# Patient Record
Sex: Female | Born: 1940
Health system: Southern US, Community
[De-identification: ages and names within clinical notes are randomized; demographics above are authoritative.]

## PROBLEM LIST (undated history)

## (undated) DIAGNOSIS — M810 Age-related osteoporosis without current pathological fracture: Secondary | ICD-10-CM

## (undated) DIAGNOSIS — I10 Essential (primary) hypertension: Secondary | ICD-10-CM

## (undated) DIAGNOSIS — E538 Deficiency of other specified B group vitamins: Secondary | ICD-10-CM

## (undated) DIAGNOSIS — K529 Noninfective gastroenteritis and colitis, unspecified: Secondary | ICD-10-CM

## (undated) DIAGNOSIS — K2990 Gastroduodenitis, unspecified, without bleeding: Secondary | ICD-10-CM

## (undated) DIAGNOSIS — L259 Unspecified contact dermatitis, unspecified cause: Secondary | ICD-10-CM

## (undated) DIAGNOSIS — K2970 Gastritis, unspecified, without bleeding: Secondary | ICD-10-CM

## (undated) DIAGNOSIS — E559 Vitamin D deficiency, unspecified: Secondary | ICD-10-CM

## (undated) DIAGNOSIS — E785 Hyperlipidemia, unspecified: Secondary | ICD-10-CM

## (undated) DIAGNOSIS — F3289 Other specified depressive episodes: Secondary | ICD-10-CM

## (undated) DIAGNOSIS — F329 Major depressive disorder, single episode, unspecified: Secondary | ICD-10-CM

## (undated) HISTORY — DX: Gastritis, unspecified, without bleeding: K29.70

## (undated) HISTORY — DX: Other specified depressive episodes: F32.89

## (undated) HISTORY — DX: Hyperlipidemia, unspecified: E78.5

## (undated) HISTORY — DX: Major depressive disorder, single episode, unspecified: F32.9

## (undated) HISTORY — PX: COLONOSCOPY: SHX174

## (undated) HISTORY — DX: Vitamin D deficiency, unspecified: E55.9

## (undated) HISTORY — DX: Deficiency of other specified B group vitamins: E53.8

## (undated) HISTORY — DX: Noninfective gastroenteritis and colitis, unspecified: K52.9

## (undated) HISTORY — DX: Gastroduodenitis, unspecified, without bleeding: K29.90

## (undated) HISTORY — DX: Essential (primary) hypertension: I10

## (undated) HISTORY — DX: Unspecified contact dermatitis, unspecified cause: L25.9

## (undated) HISTORY — DX: Age-related osteoporosis without current pathological fracture: M81.0

---

## 2002-03-01 ENCOUNTER — Other Ambulatory Visit: Admission: RE | Admit: 2002-03-01 | Discharge: 2002-03-01 | Payer: Self-pay | Admitting: Gynecology

## 2003-05-23 ENCOUNTER — Other Ambulatory Visit: Admission: RE | Admit: 2003-05-23 | Discharge: 2003-05-23 | Payer: Self-pay | Admitting: Gynecology

## 2011-02-08 ENCOUNTER — Encounter (INDEPENDENT_AMBULATORY_CARE_PROVIDER_SITE_OTHER): Payer: Self-pay | Admitting: *Deleted

## 2011-02-08 ENCOUNTER — Encounter (INDEPENDENT_AMBULATORY_CARE_PROVIDER_SITE_OTHER): Payer: Self-pay

## 2011-02-21 ENCOUNTER — Telehealth (INDEPENDENT_AMBULATORY_CARE_PROVIDER_SITE_OTHER): Payer: Self-pay | Admitting: *Deleted

## 2011-02-21 ENCOUNTER — Ambulatory Visit (INDEPENDENT_AMBULATORY_CARE_PROVIDER_SITE_OTHER): Payer: Medicare Other | Admitting: Internal Medicine

## 2011-02-21 ENCOUNTER — Encounter (INDEPENDENT_AMBULATORY_CARE_PROVIDER_SITE_OTHER): Payer: Self-pay | Admitting: Internal Medicine

## 2011-02-21 VITALS — BP 128/80 | HR 72 | Temp 98.9°F | Resp 16 | Ht 63.0 in | Wt 121.0 lb

## 2011-02-21 DIAGNOSIS — R195 Other fecal abnormalities: Secondary | ICD-10-CM

## 2011-02-21 NOTE — Telephone Encounter (Signed)
TCS sch'd 05/08/11 @ 9:30 (8:30), asa 2 days, movi prep instructions given

## 2011-02-21 NOTE — Progress Notes (Signed)
Subjective:     Patient ID: Susan Bennett, female   DOB: 1940/09/07, 70 y.o.   MRN: [id number]  HPI   Susan Bennett is 70 yr old female presenting today with c/o diarrhea.  The diarrhea started in March of this year. Her stools are thin and shaped like a  " U".  She also has been incontinent. She has to rush to go to the bathroom.  Sometimes she feels like she has to but she can't.  She usually has a BM about every other day.  In the past week however,  she says she has been better. Her stools have been normal for the past week.  When she has the sudden urge, it is loose. No recent antibiotics  Her appetite is good. No weight loss. No bright rectal bleeding or melana.   She was hospital last year for a syncopal episode.  EGD 02/22/10  Normal EGD.  No fever.  Her last colonoscopy was in Sept of 2003 by Dr. Cleotis Nipper and was normal per patient   Review of Systemssee hpi Current Outpatient Prescriptions  Medication Sig Dispense Refill  . ALPRAZolam (XANAX) 0.25 MG tablet Take 0.25 mg by mouth at bedtime as needed.        . benazepril (LOTENSIN) 20 MG tablet Take 20 mg by mouth daily.        . hydrOXYzine (ATARAX) 10 MG tablet Take 10 mg by mouth. As Needed      . HYOSCYAMINE PO Take 0.125 mg by mouth 1 day or 1 dose.        . promethazine (PHENERGAN) 25 MG tablet Take 25 mg by mouth. As needed       . sertraline (ZOLOFT) 50 MG tablet Take 50 mg by mouth daily.        . Vitamin D, Ergocalciferol, (DRISDOL) 50000 UNITS CAPS Take 50,000 Units by mouth. Patient takes by mouth once a week        History   Social History  . Marital Status: Married    Spouse Name: N/A    Number of Children: N/A  . Years of Education: N/A   Occupational History  . Not on file.   Social History Main Topics  . Smoking status: Former Smoker    Types: Cigarettes    Quit date: 02/20/2001  . Smokeless tobacco: Never Used  . Alcohol Use: No  . Drug Use: No  . Sexually Active: Not on file   Other Topics Concern  . Not on  file   Social History Narrative  . No narrative on file   Family History  Problem Relation Age of Onset  . Healthy Sister   . Healthy Brother   . Throat cancer Brother   . Stroke Brother   . Heart disease Brother    No Known Allergies     Objective:   Physical Exam Filed Vitals:   02/21/11 1609  Height: 5\' 3"  (1.6 m)  Weight: 121 lb (54.885 kg)   Filed Vitals:   02/21/11 1609  Height: 5\' 3"  (1.6 m)  Weight: 121 lb (54.885 kg)      Alert and oriented. Skin warm and dry. Oral mucosa is moist. Natural teeth in good condition. Sclera anicteric, conjunctivae is pink. Thyroid not enlarged. No cervical lymphadenopathy. Lungs clear. Heart regular rate and rhythm.  Abdomen is soft. Bowel sounds are positive. No hepatomegaly. No abdominal masses felt. No tenderness.  No edema to lower extremities. Patient is alert and oriented.  Assessment:     Change in bowel.   Colonic neoplasm needs to be ruled out.    Plan:      Will schedule a colonoscopy in the near future.The risks and benefits such as perforation, bleeding, and infection were reviewed with the patient and is agreeable.

## 2011-02-25 MED ORDER — PEG-KCL-NACL-NASULF-NA ASC-C 100 G PO SOLR: 1.0000 | Freq: Once | ORAL | Status: DC

## 2011-04-09 ENCOUNTER — Other Ambulatory Visit (INDEPENDENT_AMBULATORY_CARE_PROVIDER_SITE_OTHER): Payer: Self-pay | Admitting: *Deleted

## 2011-04-09 DIAGNOSIS — R195 Other fecal abnormalities: Secondary | ICD-10-CM

## 2011-05-01 ENCOUNTER — Encounter (HOSPITAL_COMMUNITY): Payer: Self-pay | Admitting: Pharmacy Technician

## 2011-05-08 ENCOUNTER — Encounter (HOSPITAL_COMMUNITY): Admission: RE | Payer: Self-pay | Source: Ambulatory Visit

## 2011-05-08 ENCOUNTER — Ambulatory Visit (HOSPITAL_COMMUNITY): Admission: RE | Admit: 2011-05-08 | Payer: Medicare Other | Source: Ambulatory Visit | Admitting: Internal Medicine

## 2011-05-08 SURGERY — COLONOSCOPY
Anesthesia: Moderate Sedation

## 2011-11-06 ENCOUNTER — Encounter (INDEPENDENT_AMBULATORY_CARE_PROVIDER_SITE_OTHER): Payer: Self-pay

## 2014-08-22 DIAGNOSIS — H35033 Hypertensive retinopathy, bilateral: Secondary | ICD-10-CM | POA: Diagnosis not present

## 2014-08-22 DIAGNOSIS — Z961 Presence of intraocular lens: Secondary | ICD-10-CM | POA: Diagnosis not present

## 2014-08-22 DIAGNOSIS — H26493 Other secondary cataract, bilateral: Secondary | ICD-10-CM | POA: Diagnosis not present

## 2014-08-22 DIAGNOSIS — H524 Presbyopia: Secondary | ICD-10-CM | POA: Diagnosis not present

## 2014-09-01 DIAGNOSIS — E785 Hyperlipidemia, unspecified: Secondary | ICD-10-CM | POA: Diagnosis not present

## 2014-09-01 DIAGNOSIS — E559 Vitamin D deficiency, unspecified: Secondary | ICD-10-CM | POA: Diagnosis not present

## 2014-09-01 DIAGNOSIS — L309 Dermatitis, unspecified: Secondary | ICD-10-CM | POA: Diagnosis not present

## 2014-09-01 DIAGNOSIS — I10 Essential (primary) hypertension: Secondary | ICD-10-CM | POA: Diagnosis not present

## 2014-09-01 DIAGNOSIS — R5383 Other fatigue: Secondary | ICD-10-CM | POA: Diagnosis not present

## 2014-09-01 DIAGNOSIS — F418 Other specified anxiety disorders: Secondary | ICD-10-CM | POA: Diagnosis not present

## 2014-09-01 DIAGNOSIS — E538 Deficiency of other specified B group vitamins: Secondary | ICD-10-CM | POA: Diagnosis not present

## 2014-09-01 DIAGNOSIS — G4700 Insomnia, unspecified: Secondary | ICD-10-CM | POA: Diagnosis not present

## 2014-09-21 DIAGNOSIS — E538 Deficiency of other specified B group vitamins: Secondary | ICD-10-CM | POA: Diagnosis not present

## 2014-09-21 DIAGNOSIS — I10 Essential (primary) hypertension: Secondary | ICD-10-CM | POA: Diagnosis not present

## 2014-09-21 DIAGNOSIS — L309 Dermatitis, unspecified: Secondary | ICD-10-CM | POA: Diagnosis not present

## 2014-09-21 DIAGNOSIS — F418 Other specified anxiety disorders: Secondary | ICD-10-CM | POA: Diagnosis not present

## 2015-01-25 DIAGNOSIS — M549 Dorsalgia, unspecified: Secondary | ICD-10-CM | POA: Diagnosis not present

## 2015-01-25 DIAGNOSIS — E538 Deficiency of other specified B group vitamins: Secondary | ICD-10-CM | POA: Diagnosis not present

## 2015-01-25 DIAGNOSIS — L309 Dermatitis, unspecified: Secondary | ICD-10-CM | POA: Diagnosis not present

## 2015-01-25 DIAGNOSIS — F418 Other specified anxiety disorders: Secondary | ICD-10-CM | POA: Diagnosis not present

## 2015-01-25 DIAGNOSIS — I10 Essential (primary) hypertension: Secondary | ICD-10-CM | POA: Diagnosis not present

## 2015-06-13 DIAGNOSIS — I10 Essential (primary) hypertension: Secondary | ICD-10-CM | POA: Diagnosis not present

## 2015-06-13 DIAGNOSIS — E538 Deficiency of other specified B group vitamins: Secondary | ICD-10-CM | POA: Diagnosis not present

## 2015-08-22 DIAGNOSIS — H524 Presbyopia: Secondary | ICD-10-CM | POA: Diagnosis not present

## 2015-08-22 DIAGNOSIS — H35033 Hypertensive retinopathy, bilateral: Secondary | ICD-10-CM | POA: Diagnosis not present

## 2015-08-22 DIAGNOSIS — H5213 Myopia, bilateral: Secondary | ICD-10-CM | POA: Diagnosis not present

## 2015-08-22 DIAGNOSIS — H26493 Other secondary cataract, bilateral: Secondary | ICD-10-CM | POA: Diagnosis not present

## 2015-08-31 DIAGNOSIS — E538 Deficiency of other specified B group vitamins: Secondary | ICD-10-CM | POA: Diagnosis not present

## 2016-03-21 DIAGNOSIS — R300 Dysuria: Secondary | ICD-10-CM | POA: Diagnosis not present

## 2016-03-21 DIAGNOSIS — L309 Dermatitis, unspecified: Secondary | ICD-10-CM | POA: Diagnosis not present

## 2016-03-21 DIAGNOSIS — E538 Deficiency of other specified B group vitamins: Secondary | ICD-10-CM | POA: Diagnosis not present

## 2016-03-21 DIAGNOSIS — I10 Essential (primary) hypertension: Secondary | ICD-10-CM | POA: Diagnosis not present

## 2016-03-21 DIAGNOSIS — F418 Other specified anxiety disorders: Secondary | ICD-10-CM | POA: Diagnosis not present

## 2016-04-16 ENCOUNTER — Telehealth: Payer: Self-pay | Admitting: *Deleted

## 2016-04-16 NOTE — Telephone Encounter (Signed)
aware

## 2016-04-16 NOTE — Telephone Encounter (Signed)
Patient's daughter called stating that patient is not driving, has short term memory loss, patient is living alone and daughter is concerned with memory loss

## 2016-04-16 NOTE — Telephone Encounter (Signed)
Patient is seeing you on 11/17 for a New Patient appt.  Patient states that she will no longer be seeing Dr. Charm BargesButler

## 2016-04-16 NOTE — Telephone Encounter (Signed)
Is this patient still seeing Dr. Charm BargesButler?  I do not think I have seen her here.

## 2016-04-17 ENCOUNTER — Ambulatory Visit: Payer: Self-pay | Admitting: Physician Assistant

## 2016-04-19 ENCOUNTER — Ambulatory Visit (INDEPENDENT_AMBULATORY_CARE_PROVIDER_SITE_OTHER): Payer: Medicare Other | Admitting: Physician Assistant

## 2016-04-19 ENCOUNTER — Encounter: Payer: Self-pay | Admitting: Physician Assistant

## 2016-04-19 VITALS — BP 139/65 | HR 61 | Temp 98.1°F | Ht 63.0 in | Wt 113.0 lb

## 2016-04-19 DIAGNOSIS — I10 Essential (primary) hypertension: Secondary | ICD-10-CM | POA: Diagnosis not present

## 2016-04-19 DIAGNOSIS — F330 Major depressive disorder, recurrent, mild: Secondary | ICD-10-CM

## 2016-04-19 DIAGNOSIS — R413 Other amnesia: Secondary | ICD-10-CM | POA: Diagnosis not present

## 2016-04-19 HISTORY — DX: Major depressive disorder, recurrent, mild: F33.0

## 2016-04-19 HISTORY — DX: Essential (primary) hypertension: I10

## 2016-04-19 MED ORDER — DONEPEZIL HCL 5 MG PO TABS: 5.0000 mg | ORAL_TABLET | Freq: Every day | ORAL | 1 refills | Status: DC

## 2016-04-19 NOTE — Patient Instructions (Signed)
Alzheimer Disease Alzheimer disease is a brain disease that affects memory, thinking, and behavior. People with Alzheimer disease lose mental abilities, and the disease gets worse over time. Survival with Alzheimer disease ranges from several years to as long as 20 years. What are the causes? This condition develops when a protein called beta-amyloid forms deposits in the brain. It is not known what causes these deposits to form. What increases the risk? This condition is more likely to develop in people who:  Are elderly.  Have a family history of dementia.  Have had a brain injury.  Have heart or blood vessel disease.  Have had a stroke.  Have high blood pressure or high cholesterol.  Have diabetes. What are the signs or symptoms? Symptoms of this condition happen in three stages, which often overlap. Early stage In this stage, you may continue to be independent. You may still be able to drive, work, and be social. Symptoms in this stage include:  Minor memory problems, such as forgetting a name or what you read.  Difficulty with:  Paying attention.  Communicating.  Doing familiar tasks.  Learning new things.  Needing more time to do daily activities.  Anxiety.  Social withdrawal.  Loss of motivation. Moderate stage In this stage, you will start to need care. This stage usually lasts the longest. Symptoms in this stage include:  Difficulty with expressing thoughts.  Memory loss that affects daily life. This can include forgetting:  Your address or phone number.  Events that have happened.  Parts of your personal history, like where you went to school.  Confusion about where you are or what time it is.  Difficulty in judging distance.  Changes in personality, mood, and behavior. You may be moody, irritable, angry, frustrated, fearful, anxious, or suspicious.  Poor reasoning and judgment.  Delusions or hallucinations.  Changes in sleep  patterns.  Wandering and getting lost. Severe stage In the final stage, you will need help with your personal care and dailyactivities. Symptoms in this stage include:  Worsening memory loss.  Personality changes.  Loss of awareness of your surroundings.  Changes in physical abilities, including the ability to walk, sit, and swallow.  Difficulty in communicating.  Inability to control the bladder and bowels.  Increasing confusion.  Increasing disruptive behavior. How is this diagnosed? This condition is diagnosed with an assessment by your health care provider. During this assessment, your health care provider will talk with you and your family, friends, or caregivers about your symptoms. A thorough medical history will be taken, and you will have a physical exam and tests. Tests may include:  Lab tests, such as blood or urine tests.  Imaging tests, such as a CT scan, PET scan, or MRI.  A lumbar puncture. This test involves removing and testing a small amount of the fluid that surrounds the brain and spinal cord.  An electroencephalogram (EEG). In this test, small metal discs are used to measure electrical activity in the brain.  Memory tests, cognitive tests, and neuropsychological tests. These tests evaluate brain function. How is this treated? At this time, there is no treatment to cure Alzheimer disease or stop it from getting worse. The goals of treatment are:  To slow down the disease.  To manage behavioral problems.  To provide you with a safe environment.  To make life easier for you and your caregivers. The following treatment options are available:  Medicines. Medicines may help to slow down memory loss and control behavioral symptoms.    Talk therapy. Talk therapy provides you with education, support, and memory aids. It is most helpful in the early stages of the condition.  Counseling or spiritual guidance. It is normal to have a lot of feelings, including  anger, relief, fear, and isolation. Counseling and guidance can help you deal with these feelings.  Caregiving. This involves having caregivers help you with your daily activities. Caregivers may be family members, friends, or trained medical professionals. Caregiving can be done at home or outside the home.  Family support groups. These provide education, emotional support, and information about community resources to family members who are taking care of you. Follow these instructions at home: Medicines  Take over-the-counter and prescription medicines only as told by your health care provider.  Avoid taking medicines that can affect thinking, such as pain or sleeping medicines. Lifestyle  Make healthy lifestyle choices:  Be physically active as told by your health care provider.  Do not use any tobacco products, such as cigarettes, chewing tobacco, and e-cigarettes. If you need help quitting, ask your health care provider.  Eat a healthy diet.  Practice stress-management techniques when you get stressed.  Stay social.  Drink enough fluid to keep your urine clear or pale yellow.  Make sure to get quality sleep. These tips can help you get a good night's rest:  Avoid napping during the day.  Keep your sleeping area dark and cool.  Avoid exercising during the few hours before you go to bed.  Avoid caffeine products in the evening. General instructions  Work with your health care provider to determine what you need help with and what your safety needs are.  If you were given a bracelet that tracks your location, make sure to wear it.  Keep all follow-up visits as told by your health care provider. This is important.  If you have questions or would like additional support, you may contact The Alzheimer's Association:  24-hour helpline: 1-800-272-3900  Website: www.alz.org Contact a health care provider if:  You have nausea, vomiting, or trouble with eating.  You  have dizziness, or weakness.  You have new or worsening trouble with sleeping.  You or your family members become concerned for your safety. Get help right away if:  You develop chest pain or difficulty with breathing.  You pass out. This information is not intended to replace advice given to you by your health care provider. Make sure you discuss any questions you have with your health care provider. Document Released: 01/30/2004 Document Revised: 01/19/2016 Document Reviewed: 02/15/2015 Elsevier Interactive Patient Education  2017 Elsevier Inc.  

## 2016-04-21 NOTE — Progress Notes (Signed)
BP 139/65   Pulse 61   Temp 98.1 F (36.7 C) (Oral)   Ht 5\' 3"  (1.6 m)   Wt 113 lb (51.3 kg)   BMI 20.02 kg/m    Subjective:    Patient ID: Susan Bennett, female    DOB: 1941-05-25, 75 y.o.   MRN: [id number]  Susan Bennett is a 75 y.o. female presenting on 04/19/2016 for New Patient (Initial Visit) and Establish Care  HPI this is a new patient to our office. She states that she is driving around much less because of not wanting to get lost. She does not report that she has had an episode. She knows that her daughter has similar message about her starting to be more forgetful about things and misplace things. That she also would forget things that they had told her. She has always lived her life with a very dizzy and mentally stressing job through a [employer] career. She also had a lot of caregiving to family members. She has had death in her family and has had issues with depression. She is now retired and does not have any of these other people to take care of. She states that she has had some forgetfulness. Her Mini-Mental status exam is not severely impaired. We have discussed that it may be worth trying medicine to see if that will help slow down her memory loss more. She is willing to try some Aricept at this time. All of her other medications are reviewed today. She states that she has been doing well with health otherwise.  Past Medical History:  Diagnosis Date  . Chronic diarrhea   . Contact dermatitis and other eczema, due to unspecified cause   . Depressive disorder, not elsewhere classified   . Osteoporosis, unspecified   . Other and unspecified hyperlipidemia   . Other B-complex deficiencies   . Unspecified essential hypertension   . Unspecified gastritis and gastroduodenitis without mention of hemorrhage   . Unspecified vitamin D deficiency    Relevant past medical, surgical, family and social history reviewed and updated as indicated. Interim medical history since our last  visit reviewed. Allergies and medications reviewed and updated.   Data reviewed from any sources in EPIC.  Review of Systems  Constitutional: Negative.  Negative for activity change, fatigue and fever.  HENT: Negative.   Eyes: Negative.   Respiratory: Negative.  Negative for cough.   Cardiovascular: Negative.  Negative for chest pain.  Gastrointestinal: Negative.  Negative for abdominal pain.  Endocrine: Negative.   Genitourinary: Negative.  Negative for dysuria.  Musculoskeletal: Negative.   Skin: Negative.   Neurological: Negative.  Negative for seizures, weakness, light-headedness and headaches.     Social History   Social History  . Marital status: Divorced    Spouse name: N/A  . Number of children: N/A  . Years of education: N/A   Occupational History  . Not on file.   Social History Main Topics  . Smoking status: Former Smoker    Types: Cigarettes    Quit date: 02/20/2001  . Smokeless tobacco: Never Used  . Alcohol use No  . Drug use: No  . Sexual activity: Not on file   Other Topics Concern  . Not on file   Social History Narrative  . No narrative on file    Past Surgical History:  Procedure Laterality Date  . COLONOSCOPY     9 YEARS AGO    Family History  Problem Relation Age of Onset  .  Healthy Sister   . Healthy Brother   . Throat cancer Brother   . Stroke Brother   . Heart disease Brother       Medication List       Accurate as of 04/19/16 11:59 PM. Always use your most recent med list.          ALPRAZolam 0.25 MG tablet Commonly known as:  XANAX Take 0.25 mg by mouth 2 (two) times daily as needed for anxiety.   benazepril 40 MG tablet Commonly known as:  LOTENSIN   donepezil 5 MG tablet Commonly known as:  ARICEPT Take 1 tablet (5 mg total) by mouth at bedtime.   sertraline 50 MG tablet Commonly known as:  ZOLOFT Take 50 mg by mouth daily.          Objective:    BP 139/65   Pulse 61   Temp 98.1 F (36.7 C) (Oral)    Ht 5\' 3"  (1.6 m)   Wt 113 lb (51.3 kg)   BMI 20.02 kg/m   No Known Allergies Wt Readings from Last 3 Encounters:  04/19/16 113 lb (51.3 kg)  02/21/11 121 lb (54.9 kg)    Physical Exam  Constitutional: She is oriented to person, place, and time. She appears well-developed and well-nourished.  HENT:  Head: Normocephalic and atraumatic.  Eyes: Conjunctivae and EOM are normal. Pupils are equal, round, and reactive to light.  Neck: Normal range of motion. Neck supple.  Cardiovascular: Normal rate, regular rhythm, normal heart sounds and intact distal pulses.   Pulmonary/Chest: Effort normal and breath sounds normal.  Abdominal: Soft. Bowel sounds are normal.  Neurological: She is alert and oriented to person, place, and time. She has normal reflexes.  Skin: Skin is warm and dry. No rash noted.  Psychiatric: She has a normal mood and affect. Her behavior is normal. Judgment and thought content normal.         Assessment & Plan:   1. Essential hypertension - benazepril (LOTENSIN) 40 MG tablet;   2. Mild episode of recurrent major depressive disorder (HCC) - ALPRAZolam (XANAX) 0.25 MG tablet; Take 0.25 mg by mouth 2 (two) times daily as needed for anxiety.  3. Memory loss - donepezil (ARICEPT) 5 MG tablet; Take 1 tablet (5 mg total) by mouth at bedtime.  Dispense: 30 tablet; Refill: 1   Continue all other maintenance medications as listed above. Educational handout given for alzheimer's.  Follow up plan: Return in about 4 weeks (around 05/17/2016) for recheck.  Remus LofflerAngel S. Doroteo Nickolson PA-C [facility] [address]  [place], [place] [address] [phone]   04/21/2016, 7:28 PM

## 2016-05-20 ENCOUNTER — Ambulatory Visit: Payer: Medicare Other | Admitting: Physician Assistant

## 2016-05-21 ENCOUNTER — Ambulatory Visit (INDEPENDENT_AMBULATORY_CARE_PROVIDER_SITE_OTHER): Payer: Medicare Other | Admitting: Physician Assistant

## 2016-05-21 ENCOUNTER — Encounter: Payer: Self-pay | Admitting: Physician Assistant

## 2016-05-21 DIAGNOSIS — R413 Other amnesia: Secondary | ICD-10-CM | POA: Diagnosis not present

## 2016-05-21 MED ORDER — DONEPEZIL HCL 10 MG PO TABS
5.0000 mg | ORAL_TABLET | Freq: Every day | ORAL | 11 refills | Status: DC
Start: 2016-05-21 — End: 2016-11-29

## 2016-05-21 NOTE — Patient Instructions (Signed)
In about one month we will call you and start [facility] which is a second memory medicine that you will add to ARICEPT 10 mg.  This month you need to take aricept 10 mg daily, you had been on 5 mg.  The new medicine Namenda in one month with come as a starter pack with full instruction on how to titrate it up.  For now still just take the aricept 10 mg.

## 2016-05-23 NOTE — Progress Notes (Signed)
BP (!) 144/72   Pulse 67   Temp 97.8 F (36.6 C) (Oral)   Ht 5\' 3"  (1.6 m)   Wt 112 lb 12.8 oz (51.2 kg)   BMI 19.98 kg/m    Subjective:    Patient ID: Susan Bennett, female    DOB: 07-25-1940, 75 y.o.   MRN: [id number]  HPI: Susan Bennett is a 75 y.o. female presenting on 05/21/2016 for Hypertension  This patient comes in for periodic recheck on medications and conditions. All medications are reviewed today. There are no reports of any problems with the medications. All of the medical conditions are reviewed and updated.  Lab work is reviewed and will be ordered as medically necessary. There are no new problems reported with today's visit. Reports that she is tolerating her medication very well and does already think her memory is improving. States that her daughter thinks it is better.  Ready to increase the aricept and in one month add Namenda.  Relevant past medical, surgical, family and social history reviewed and updated as indicated. Allergies and medications reviewed and updated.  Past Medical History:  Diagnosis Date  . Chronic diarrhea   . Contact dermatitis and other eczema, due to unspecified cause   . Depressive disorder, not elsewhere classified   . Osteoporosis, unspecified   . Other and unspecified hyperlipidemia   . Other B-complex deficiencies   . Unspecified essential hypertension   . Unspecified gastritis and gastroduodenitis without mention of hemorrhage   . Unspecified vitamin D deficiency     Past Surgical History:  Procedure Laterality Date  . COLONOSCOPY     9 YEARS AGO    Review of Systems  Constitutional: Negative.  Negative for activity change, fatigue and fever.  HENT: Negative.   Eyes: Negative.   Respiratory: Negative.  Negative for cough.   Cardiovascular: Negative.  Negative for chest pain.  Gastrointestinal: Negative.  Negative for abdominal pain.  Endocrine: Negative.   Genitourinary: Negative.  Negative for dysuria.    Musculoskeletal: Negative.   Skin: Negative.   Neurological: Negative.     Allergies as of 05/21/2016   No Known Allergies     Medication List       Accurate as of 05/21/16 11:59 PM. Always use your most recent med list.          ALPRAZolam 0.25 MG tablet Commonly known as:  XANAX Take 0.25 mg by mouth 2 (two) times daily as needed for anxiety.   benazepril 40 MG tablet Commonly known as:  LOTENSIN   donepezil 10 MG tablet Commonly known as:  ARICEPT Take 0.5 tablets (5 mg total) by mouth at bedtime.   sertraline 50 MG tablet Commonly known as:  ZOLOFT Take 50 mg by mouth daily.          Objective:    BP (!) 144/72   Pulse 67   Temp 97.8 F (36.6 C) (Oral)   Ht 5\' 3"  (1.6 m)   Wt 112 lb 12.8 oz (51.2 kg)   BMI 19.98 kg/m   No Known Allergies  Physical Exam  Constitutional: She is oriented to person, place, and time. She appears well-developed and well-nourished.  HENT:  Head: Normocephalic and atraumatic.  Eyes: Conjunctivae and EOM are normal. Pupils are equal, round, and reactive to light.  Cardiovascular: Normal rate, regular rhythm, normal heart sounds and intact distal pulses.   Pulmonary/Chest: Effort normal and breath sounds normal.  Abdominal: Soft. Bowel sounds are normal.  Neurological:  She is alert and oriented to person, place, and time. She has normal reflexes.  Skin: Skin is warm and dry. No rash noted.  Psychiatric: She has a normal mood and affect. Her behavior is normal. Judgment and thought content normal.    No results found for this or any previous visit.    Assessment & Plan:   1. Memory loss - donepezil (ARICEPT) 10 MG tablet; Take 0.5 tablets (5 mg total) by mouth at bedtime.  Dispense: 30 tablet; Refill: 11   Continue all other maintenance medications as listed above.  Follow up plan: Return in about 2 months (around 07/22/2016) for recheck on meds.  Educational handout given for memory loss  Remus LofflerAngel S. Neal Oshea  PA-C [facility] [address]  [place], [place] [address] [phone]   05/23/2016, 9:35 PM

## 2016-07-12 ENCOUNTER — Ambulatory Visit (INDEPENDENT_AMBULATORY_CARE_PROVIDER_SITE_OTHER): Payer: Medicare Other | Admitting: *Deleted

## 2016-07-12 DIAGNOSIS — E538 Deficiency of other specified B group vitamins: Secondary | ICD-10-CM | POA: Diagnosis not present

## 2016-07-12 MED ORDER — CYANOCOBALAMIN 1000 MCG/ML IJ SOLN
1000.0000 ug | INTRAMUSCULAR | Status: AC
  Administered 2016-07-12 – 2022-02-25 (×16): 1000 ug via INTRAMUSCULAR

## 2016-07-12 NOTE — Progress Notes (Signed)
Pt given Vit B12 inj Tolerated well 

## 2016-08-20 ENCOUNTER — Ambulatory Visit (INDEPENDENT_AMBULATORY_CARE_PROVIDER_SITE_OTHER): Payer: Medicare Other | Admitting: *Deleted

## 2016-08-20 DIAGNOSIS — E538 Deficiency of other specified B group vitamins: Secondary | ICD-10-CM

## 2016-08-20 NOTE — Progress Notes (Signed)
Pt given vit B12 inj Tolerated well 

## 2016-08-22 DIAGNOSIS — H26492 Other secondary cataract, left eye: Secondary | ICD-10-CM | POA: Diagnosis not present

## 2016-08-22 DIAGNOSIS — H524 Presbyopia: Secondary | ICD-10-CM | POA: Diagnosis not present

## 2016-08-22 DIAGNOSIS — H5213 Myopia, bilateral: Secondary | ICD-10-CM | POA: Diagnosis not present

## 2016-08-22 DIAGNOSIS — H35033 Hypertensive retinopathy, bilateral: Secondary | ICD-10-CM | POA: Diagnosis not present

## 2016-09-05 DIAGNOSIS — H26491 Other secondary cataract, right eye: Secondary | ICD-10-CM | POA: Diagnosis not present

## 2016-11-29 ENCOUNTER — Ambulatory Visit (INDEPENDENT_AMBULATORY_CARE_PROVIDER_SITE_OTHER): Payer: Medicare Other | Admitting: Physician Assistant

## 2016-11-29 ENCOUNTER — Encounter: Payer: Self-pay | Admitting: Physician Assistant

## 2016-11-29 VITALS — BP 180/84 | HR 60 | Temp 99.4°F | Ht 63.0 in | Wt 110.8 lb

## 2016-11-29 DIAGNOSIS — F0280 Dementia in other diseases classified elsewhere without behavioral disturbance: Secondary | ICD-10-CM | POA: Diagnosis not present

## 2016-11-29 DIAGNOSIS — E538 Deficiency of other specified B group vitamins: Secondary | ICD-10-CM | POA: Diagnosis not present

## 2016-11-29 DIAGNOSIS — G309 Alzheimer's disease, unspecified: Secondary | ICD-10-CM

## 2016-11-29 MED ORDER — MEMANTINE HCL ER 7 & 14 & 21 &28 MG PO CP24: 7.0000 mg | ORAL_CAPSULE | Freq: Every day | ORAL | 5 refills | Status: DC

## 2016-11-29 NOTE — Patient Instructions (Signed)
Alzheimer Disease Alzheimer disease is a brain disease that affects memory, thinking, and behavior. People with Alzheimer disease lose mental abilities, and the disease gets worse over time. Survival with Alzheimer disease ranges from several years to as long as 20 years. What are the causes? This condition develops when a protein called beta-amyloid forms deposits in the brain. It is not known what causes these deposits to form. What increases the risk? This condition is more likely to develop in people who:  Are elderly.  Have a family history of dementia.  Have had a brain injury.  Have heart or blood vessel disease.  Have had a stroke.  Have high blood pressure or high cholesterol.  Have diabetes. What are the signs or symptoms? Symptoms of this condition happen in three stages, which often overlap. Early stage In this stage, you may continue to be independent. You may still be able to drive, work, and be social. Symptoms in this stage include:  Minor memory problems, such as forgetting a name or what you read.  Difficulty with:  Paying attention.  Communicating.  Doing familiar tasks.  Learning new things.  Needing more time to do daily activities.  Anxiety.  Social withdrawal.  Loss of motivation. Moderate stage In this stage, you will start to need care. This stage usually lasts the longest. Symptoms in this stage include:  Difficulty with expressing thoughts.  Memory loss that affects daily life. This can include forgetting:  Your address or phone number.  Events that have happened.  Parts of your personal history, like where you went to school.  Confusion about where you are or what time it is.  Difficulty in judging distance.  Changes in personality, mood, and behavior. You may be moody, irritable, angry, frustrated, fearful, anxious, or suspicious.  Poor reasoning and judgment.  Delusions or hallucinations.  Changes in sleep  patterns.  Wandering and getting lost. Severe stage In the final stage, you will need help with your personal care and dailyactivities. Symptoms in this stage include:  Worsening memory loss.  Personality changes.  Loss of awareness of your surroundings.  Changes in physical abilities, including the ability to walk, sit, and swallow.  Difficulty in communicating.  Inability to control the bladder and bowels.  Increasing confusion.  Increasing disruptive behavior. How is this diagnosed? This condition is diagnosed with an assessment by your health care provider. During this assessment, your health care provider will talk with you and your family, friends, or caregivers about your symptoms. A thorough medical history will be taken, and you will have a physical exam and tests. Tests may include:  Lab tests, such as blood or urine tests.  Imaging tests, such as a CT scan, PET scan, or MRI.  A lumbar puncture. This test involves removing and testing a small amount of the fluid that surrounds the brain and spinal cord.  An electroencephalogram (EEG). In this test, small metal discs are used to measure electrical activity in the brain.  Memory tests, cognitive tests, and neuropsychological tests. These tests evaluate brain function. How is this treated? At this time, there is no treatment to cure Alzheimer disease or stop it from getting worse. The goals of treatment are:  To slow down the disease.  To manage behavioral problems.  To provide you with a safe environment.  To make life easier for you and your caregivers. The following treatment options are available:  Medicines. Medicines may help to slow down memory loss and control behavioral symptoms.    Talk therapy. Talk therapy provides you with education, support, and memory aids. It is most helpful in the early stages of the condition.  Counseling or spiritual guidance. It is normal to have a lot of feelings, including  anger, relief, fear, and isolation. Counseling and guidance can help you deal with these feelings.  Caregiving. This involves having caregivers help you with your daily activities. Caregivers may be family members, friends, or trained medical professionals. Caregiving can be done at home or outside the home.  Family support groups. These provide education, emotional support, and information about community resources to family members who are taking care of you. Follow these instructions at home: Medicines  Take over-the-counter and prescription medicines only as told by your health care provider.  Avoid taking medicines that can affect thinking, such as pain or sleeping medicines. Lifestyle  Make healthy lifestyle choices:  Be physically active as told by your health care provider.  Do not use any tobacco products, such as cigarettes, chewing tobacco, and e-cigarettes. If you need help quitting, ask your health care provider.  Eat a healthy diet.  Practice stress-management techniques when you get stressed.  Stay social.  Drink enough fluid to keep your urine clear or pale yellow.  Make sure to get quality sleep. These tips can help you get a good night's rest:  Avoid napping during the day.  Keep your sleeping area dark and cool.  Avoid exercising during the few hours before you go to bed.  Avoid caffeine products in the evening. General instructions  Work with your health care provider to determine what you need help with and what your safety needs are.  If you were given a bracelet that tracks your location, make sure to wear it.  Keep all follow-up visits as told by your health care provider. This is important.  If you have questions or would like additional support, you may contact The Alzheimer's Association:  24-hour helpline: 1-800-272-3900  Website: www.alz.org Contact a health care provider if:  You have nausea, vomiting, or trouble with eating.  You  have dizziness, or weakness.  You have new or worsening trouble with sleeping.  You or your family members become concerned for your safety. Get help right away if:  You develop chest pain or difficulty with breathing.  You pass out. This information is not intended to replace advice given to you by your health care provider. Make sure you discuss any questions you have with your health care provider. Document Released: 01/30/2004 Document Revised: 01/19/2016 Document Reviewed: 02/15/2015 Elsevier Interactive Patient Education  2017 Elsevier Inc.  

## 2016-12-02 DIAGNOSIS — G300 Alzheimer's disease with early onset: Secondary | ICD-10-CM | POA: Insufficient documentation

## 2016-12-02 DIAGNOSIS — F0280 Dementia in other diseases classified elsewhere without behavioral disturbance: Secondary | ICD-10-CM | POA: Insufficient documentation

## 2016-12-02 DIAGNOSIS — G309 Alzheimer's disease, unspecified: Secondary | ICD-10-CM

## 2016-12-02 DIAGNOSIS — E538 Deficiency of other specified B group vitamins: Secondary | ICD-10-CM

## 2016-12-02 HISTORY — DX: Deficiency of other specified B group vitamins: E53.8

## 2016-12-02 HISTORY — DX: Dementia in other diseases classified elsewhere, unspecified severity, without behavioral disturbance, psychotic disturbance, mood disturbance, and anxiety: F02.80

## 2016-12-02 HISTORY — DX: Alzheimer's disease with early onset: G30.0

## 2016-12-02 NOTE — Progress Notes (Signed)
BP (!) 180/84   Pulse 60   Temp 99.4 F (37.4 C) (Oral)   Ht 5\' 3"[id number]$  (1.6 m)   Wt 110 lb 12.8 oz (50.3 kg)   BMI 19.63 kg/m    Subjective:    Patient ID: Susan Bennett, female    DOB: [birth year], 76 y.o.   MRN: [id number]  HPI: Susan Bennett is a 76 y.o. female presenting on 11/29/2016 for Follow-up (On memory loss )  This patient comes in for periodic recheck on medications and conditions including memory loss/dementia. Patient has not been tolerating the Aricept very well. She is felt sick while taking it. Therefore we will stop the medication and try Namenda titration pack. Daughter is in the room with us and agreeable to this plan.  All medications are reviewed today. There are no reports of any problems with the medications. All of the medical conditions are reviewed and updated.  Lab work is reviewed and will be ordered as medically necessary. There are no new problems reported with today's visit.   Relevant past medical, surgical, family and social history reviewed and updated as indicated. Allergies and medications reviewed and updated.  Past Medical History:  Diagnosis Date  . Chronic diarrhea   . Contact dermatitis and other eczema, due to unspecified cause   . Depressive disorder, not elsewhere classified   . Osteoporosis, unspecified   . Other and unspecified hyperlipidemia   . Other B-complex deficiencies   . Unspecified essential hypertension   . Unspecified gastritis and gastroduodenitis without mention of hemorrhage   . Unspecified vitamin D deficiency     Past Surgical History:  Procedure Laterality Date  . COLONOSCOPY     9 YEARS AGO    Review of Systems  Constitutional: Negative.  Negative for activity change, fatigue and fever.  HENT: Negative.   Eyes: Negative.   Respiratory: Negative.  Negative for cough.   Cardiovascular: Negative.  Negative for chest pain.  Gastrointestinal: Negative.  Negative for abdominal pain.  Endocrine: Negative.     Genitourinary: Negative.  Negative for dysuria.  Musculoskeletal: Negative.   Skin: Negative.   Neurological: Negative.  Negative for dizziness, weakness and headaches.    Allergies as of 11/29/2016   No Known Allergies     Medication List       Accurate as of 11/29/16 11:59 PM. Always use your most recent med list.          ALPRAZolam 0.25 MG tablet Commonly known as:  XANAX Take 0.25 mg by mouth 2 (two) times daily as needed for anxiety.   benazepril 40 MG tablet Commonly known as:  LOTENSIN   Memantine HCl ER 7 & 14 & 21 &28 MG Cp24 Take 7-28 mg by mouth daily.   sertraline 50 MG tablet Commonly known as:  ZOLOFT Take 50 mg by mouth daily.          Objective:    BP (!) 180/84   Pulse 60   Temp 99.4 F (37.4 C) (Oral)   Ht 5\' 3"  (1.6 m)   Wt 110 lb 12.8 oz (50.3 kg)   BMI 19.63 kg/m   No Known Allergies  Physical Exam  Constitutional: She is oriented to person, place, and time. She appears well-developed and well-nourished.  HENT:  Head: Normocephalic and atraumatic.  Right Ear: Tympanic membrane, external ear and ear canal normal.  Left Ear: Tympanic membrane, external ear and ear canal normal.  Nose: Nose normal. No rhinorrhea.  Mouth/Throat: Oropharynx  is clear and moist and mucous membranes are normal. No oropharyngeal exudate or posterior oropharyngeal erythema.  Eyes: Conjunctivae and EOM are normal. Pupils are equal, round, and reactive to light.  Neck: Normal range of motion. Neck supple.  Cardiovascular: Normal rate, regular rhythm, normal heart sounds and intact distal pulses.   Pulmonary/Chest: Effort normal and breath sounds normal.  Abdominal: Soft. Bowel sounds are normal.  Neurological: She is alert and oriented to person, place, and time. She has normal reflexes.  Skin: Skin is warm and dry. No rash noted.  Psychiatric: She has a normal mood and affect. Her behavior is normal. Judgment and thought content normal.    No results found  for this or any previous visit.    Assessment & Plan:   1. Alzheimer's dementia without behavioral disturbance, unspecified timing of dementia onset - Memantine HCl ER 7 & 14 & 21 &28 MG CP24; Take 7-28 mg by mouth daily.  Dispense: 30 capsule; Refill: 5  2. Vitamin B12 deficiency   Current Outpatient Prescriptions:  .  ALPRAZolam (XANAX) 0.25 MG tablet, Take 0.25 mg by mouth 2 (two) times daily as needed for anxiety., Disp: , Rfl:  .  benazepril (LOTENSIN) 40 MG tablet, , Disp: , Rfl:  .  sertraline (ZOLOFT) 50 MG tablet, Take 50 mg by mouth daily.  , Disp: , Rfl:  .  Memantine HCl ER 7 & 14 & 21 &28 MG CP24, Take 7-28 mg by mouth daily., Disp: 30 capsule, Rfl: 5  Current Facility-Administered Medications:  .  cyanocobalamin ((VITAMIN B-12)) injection 1,000 mcg, 1,000 mcg, Intramuscular, Q30 days, Prudy Feeler S, PA-C, 1,000 mcg at 11/29/16 1621  Continue all other maintenance medications as listed above.  Follow up plan: recheck 3 months  Educational handout given for dementia  Remus Loffler PA-C [facility] [address]  [place], [place] [address] [phone]   12/02/2016, 3:28 PM

## 2016-12-11 ENCOUNTER — Telehealth: Payer: Self-pay | Admitting: Physician Assistant

## 2016-12-11 MED ORDER — MEMANTINE HCL 5 MG PO TABS: 5.0000 mg | ORAL_TABLET | Freq: Two times a day (BID) | ORAL | 1 refills | Status: DC

## 2016-12-11 NOTE — Telephone Encounter (Signed)
sent 

## 2017-01-14 ENCOUNTER — Encounter: Payer: Self-pay | Admitting: Neurology

## 2017-01-14 ENCOUNTER — Ambulatory Visit (INDEPENDENT_AMBULATORY_CARE_PROVIDER_SITE_OTHER): Payer: Medicare Other | Admitting: Physician Assistant

## 2017-01-14 ENCOUNTER — Encounter: Payer: Self-pay | Admitting: Physician Assistant

## 2017-01-14 VITALS — BP 134/60 | HR 57 | Temp 98.0°F | Ht 63.0 in | Wt 112.4 lb

## 2017-01-14 DIAGNOSIS — F0280 Dementia in other diseases classified elsewhere without behavioral disturbance: Secondary | ICD-10-CM

## 2017-01-14 DIAGNOSIS — E538 Deficiency of other specified B group vitamins: Secondary | ICD-10-CM | POA: Diagnosis not present

## 2017-01-14 DIAGNOSIS — G309 Alzheimer's disease, unspecified: Secondary | ICD-10-CM

## 2017-01-14 DIAGNOSIS — I10 Essential (primary) hypertension: Secondary | ICD-10-CM | POA: Diagnosis not present

## 2017-01-14 MED ORDER — MEMANTINE HCL 10 MG PO TABS: 5.0000 mg | ORAL_TABLET | Freq: Two times a day (BID) | ORAL | 5 refills | Status: DC

## 2017-01-14 MED ORDER — SERTRALINE HCL 50 MG PO TABS: 50.0000 mg | ORAL_TABLET | Freq: Every day | ORAL | 3 refills | Status: DC

## 2017-01-14 NOTE — Progress Notes (Signed)
BP 134/60   Pulse (!) 57   Temp 98 F (36.7 C) (Oral)   Ht 5\' 3"  (1.6 m)   Wt 112 lb 6.4 oz (51 kg)   BMI 19.91 kg/m    Subjective:    Patient ID: Susan Bennett, female    DOB: 05/23/1941, 76 y.o.   MRN: [id number]  HPI: Susan Bennett is a 76 y.o. female presenting on 01/14/2017 for Follow-up (Medication recheck)  She comes in today with her daughter for a recheck on her dementia. Daughter and family would like a neurology referral. She has not had any major setback, not getting lost or harmful to self.  She does not drive anymore.  Tolerating the Namenda. She did not tolerate the aricept very well.  Relevant past medical, surgical, family and social history reviewed and updated as indicated. Allergies and medications reviewed and updated.  Past Medical History:  Diagnosis Date  . Chronic diarrhea   . Contact dermatitis and other eczema, due to unspecified cause   . Depressive disorder, not elsewhere classified   . Osteoporosis, unspecified   . Other and unspecified hyperlipidemia   . Other B-complex deficiencies   . Unspecified essential hypertension   . Unspecified gastritis and gastroduodenitis without mention of hemorrhage   . Unspecified vitamin D deficiency     Past Surgical History:  Procedure Laterality Date  . COLONOSCOPY     9 YEARS AGO    Review of Systems  Constitutional: Negative.  Negative for activity change, fatigue and fever.  HENT: Negative.   Eyes: Negative.   Respiratory: Negative.  Negative for cough.   Cardiovascular: Negative.  Negative for chest pain.  Gastrointestinal: Negative.  Negative for abdominal pain.  Endocrine: Negative.   Genitourinary: Negative.  Negative for dysuria.  Musculoskeletal: Negative.   Skin: Negative.   Neurological: Negative.  Negative for syncope, speech difficulty, light-headedness and numbness.    Allergies as of 01/14/2017   No Known Allergies     Medication List       Accurate as of 01/14/17 10:29 PM.  Always use your most recent med list.          ALPRAZolam 0.25 MG tablet Commonly known as:  XANAX Take 0.25 mg by mouth 2 (two) times daily as needed for anxiety.   benazepril 40 MG tablet Commonly known as:  LOTENSIN   memantine 10 MG tablet Commonly known as:  NAMENDA Take 0.5 tablets (5 mg total) by mouth 2 (two) times daily.   sertraline 50 MG tablet Commonly known as:  ZOLOFT Take 1 tablet (50 mg total) by mouth daily.          Objective:    BP 134/60   Pulse (!) 57   Temp 98 F (36.7 C) (Oral)   Ht 5\' 3"  (1.6 m)   Wt 112 lb 6.4 oz (51 kg)   BMI 19.91 kg/m   No Known Allergies  Physical Exam  Constitutional: She is oriented to person, place, and time. She appears well-developed and well-nourished.  HENT:  Head: Normocephalic and atraumatic.  Eyes: Pupils are equal, round, and reactive to light. Conjunctivae and EOM are normal.  Cardiovascular: Normal rate, regular rhythm, normal heart sounds and intact distal pulses.   Pulmonary/Chest: Effort normal and breath sounds normal.  Abdominal: Soft. Bowel sounds are normal.  Neurological: She is alert and oriented to person, place, and time. She has normal reflexes.  Skin: Skin is warm and dry. No rash noted.  Psychiatric:  She has a normal mood and affect. Her behavior is normal. Judgment and thought content normal.  Nursing note and vitals reviewed.   No results found for this or any previous visit.    Assessment & Plan:   1. Alzheimer's dementia without behavioral disturbance, unspecified timing of dementia onset Referral to neurolgy Increase namenda  - memantine (NAMENDA) 10 MG tablet; Take 0.5 tablets (5 mg total) by mouth 2 (two) times daily.  Dispense: 60 tablet; Refill: 5 - Ambulatory referral to Neurology  2. Vitamin B12 deficiency Injection given today, come monthly  3. Essential hypertension     Current Outpatient Prescriptions:  .  ALPRAZolam (XANAX) 0.25 MG tablet, Take 0.25 mg by mouth  2 (two) times daily as needed for anxiety., Disp: , Rfl:  .  benazepril (LOTENSIN) 40 MG tablet, , Disp: , Rfl:  .  memantine (NAMENDA) 10 MG tablet, Take 0.5 tablets (5 mg total) by mouth 2 (two) times daily., Disp: 60 tablet, Rfl: 5 .  sertraline (ZOLOFT) 50 MG tablet, Take 1 tablet (50 mg total) by mouth daily., Disp: 90 tablet, Rfl: 3  Current Facility-Administered Medications:  .  cyanocobalamin ((VITAMIN B-12)) injection 1,000 mcg, 1,000 mcg, Intramuscular, Q30 days, Prudy Feeler S, PA-C, 1,000 mcg at 01/14/17 1142 Continue all other maintenance medications as listed above.  Follow up plan: Return in about 3 months (around 04/16/2017) for recheck.  Educational handout given for survey  Remus Loffler PA-C [facility] [address]  [place], [place] [address] [phone]   01/14/2017, 10:29 PM

## 2017-01-14 NOTE — Patient Instructions (Signed)
In a few days you may receive a survey in the mail or online from [employer] regarding your visit with us today. Please take a moment to fill this out. Your feedback is very important to our whole office. It can help us better understand your needs as well as improve your experience and satisfaction. Thank you for taking your time to complete it. We care about you.  Jamarie Mussa, PA-C  

## 2017-03-20 ENCOUNTER — Ambulatory Visit (INDEPENDENT_AMBULATORY_CARE_PROVIDER_SITE_OTHER): Payer: Medicare Other | Admitting: Neurology

## 2017-03-20 ENCOUNTER — Encounter: Payer: Self-pay | Admitting: Neurology

## 2017-03-20 VITALS — BP 144/62 | HR 96

## 2017-03-20 DIAGNOSIS — G3184 Mild cognitive impairment, so stated: Secondary | ICD-10-CM | POA: Diagnosis not present

## 2017-03-20 NOTE — Patient Instructions (Addendum)
1. Start taking Namenda 10mg  1/2 tablet twice a day 2. Follow-up in 6 months, call for any changes  FALL PRECAUTIONS: Be cautious when walking. Scan the area for obstacles that may increase the risk of trips and falls. When getting up in the mornings, sit up at the edge of the bed for a few minutes before getting out of bed. Consider elevating the bed at the head end to avoid drop of blood pressure when getting up. Walk always in a well-lit room (use night lights in the walls). Avoid area rugs or power cords from appliances in the middle of the walkways. Use a walker or a cane if necessary and consider physical therapy for balance exercise. Get your eyesight checked regularly.  FINANCIAL OVERSIGHT: Supervision, especially oversight when making financial decisions or transactions is also recommended.  HOME SAFETY: Consider the safety of the kitchen when operating appliances like stoves, microwave oven, and blender. Consider having supervision and share cooking responsibilities until no longer able to participate in those. Accidents with firearms and other hazards in the house should be identified and addressed as well.  DRIVING: Regarding driving, in patients with progressive memory problems, driving will be impaired. We advise to have someone else do the driving if trouble finding directions or if minor accidents are reported. Independent driving assessment is available to determine safety of driving.  ABILITY TO BE LEFT ALONE: If patient is unable to contact 911 operator, consider using LifeLine, or when the need is there, arrange for someone to stay with patients. Smoking is a fire hazard, consider supervision or cessation. Risk of wandering should be assessed by caregiver and if detected at any point, supervision and safe proof recommendations should be instituted.  MEDICATION SUPERVISION: Inability to self-administer medication needs to be constantly addressed. Implement a mechanism to ensure safe  administration of the medications.  RECOMMENDATIONS FOR ALL PATIENTS WITH MEMORY PROBLEMS: 1. Continue to exercise (Recommend 30 minutes of walking everyday, or 3 hours every week) 2. Increase social interactions - continue going to [place] and enjoy social gatherings with friends and family 3. Eat healthy, avoid fried foods and eat more fruits and vegetables 4. Maintain adequate blood pressure, blood sugar, and blood cholesterol level. Reducing the risk of stroke and cardiovascular disease also helps promoting better memory. 5. Avoid stressful situations. Live a simple life and avoid aggravations. Organize your time and prepare for the next day in anticipation. 6. Sleep well, avoid any interruptions of sleep and avoid any distractions in the bedroom that may interfere with adequate sleep quality 7. Avoid sugar, avoid sweets as there is a strong link between excessive sugar intake, diabetes, and cognitive impairment We discussed the Mediterranean diet, which has been shown to help patients reduce the risk of progressive memory disorders and reduces cardiovascular risk. This includes eating fish, eat fruits and green leafy vegetables, nuts like almonds and hazelnuts, walnuts, and also use olive oil. Avoid fast foods and fried foods as much as possible. Avoid sweets and sugar as sugar use has been linked to worsening of memory function.  There is always a concern of gradual progression of memory problems. If this is the case, then we may need to adjust level of care according to patient needs. Support, both to the patient and caregiver, should then be put into place.

## 2017-03-20 NOTE — Progress Notes (Signed)
NEUROLOGY CONSULTATION NOTE  Susan Bennett MRN: [id number] DOB: April 08, 1941  Referring provider: Prudy Feeler, PA-C Primary care provider: Prudy Feeler, PA-C  Reason for consult:  dementia  Dear Dr Yetta Barre:  Thank you for your kind referral of Susan Bennett for consultation of the above symptoms. Although her history is well known to you, please allow me to reiterate it for the purpose of our medical record. The patient was accompanied to the clinic by her daughter who also provides collateral information. Records and images were personally reviewed where available.  HISTORY OF PRESENT ILLNESS: This is a 76 year old right-handed woman with a history of hypertension, depression, chronic diarrhea, presenting for evaluation of dementia. She does not problems with short-term memory, "I just forget things, I just don't remember things like I used to." She denies missing bills or medications and states she does not drive much. She denies any word-finding difficulties. Her daughter started noticing memory changes around 6-12 months ago, she was initially occasionally repeating herself, then started forgetting if she had already fed the dog or what she had for dinner previously. She lives alone, her daughter does not know of any problems with medications or bill payments. She has been on B12 injections for the past 2-3 years. She reports that she has always been independent and since retirement "just wanted to do nothing." She looks after her dog during the daytime, or goes to bible study. She goes out most nights to eat. She had GI side effects on Aricept and has been taking Namenda 5mg  BID with no side effects.   She has rare headaches. She denies any diarrhea currently and states she has a little constipation. She denies any headaches, dizziness, diplopia, dysarthria/dysphagia, neck/back pain, focal numbness/tingling/weakness, anosmia, or tremors. Her daughter denies any personality changes, no paranoia. No  difficulties with ADls. Her mother had memory issues later in life. She denies any significant head injuries, no alcohol use.    PAST MEDICAL HISTORY: Past Medical History:  Diagnosis Date  . Chronic diarrhea   . Contact dermatitis and other eczema, due to unspecified cause   . Depressive disorder, not elsewhere classified   . Osteoporosis, unspecified   . Other and unspecified hyperlipidemia   . Other B-complex deficiencies   . Unspecified essential hypertension   . Unspecified gastritis and gastroduodenitis without mention of hemorrhage   . Unspecified vitamin D deficiency     PAST SURGICAL HISTORY: Past Surgical History:  Procedure Laterality Date  . COLONOSCOPY     9 YEARS AGO    MEDICATIONS: Current Outpatient Prescriptions on File Prior to Visit  Medication Sig Dispense Refill  . ALPRAZolam (XANAX) 0.25 MG tablet Take 0.25 mg by mouth 2 (two) times daily as needed for anxiety.    . benazepril (LOTENSIN) 40 MG tablet     . memantine (NAMENDA) 10 MG tablet Take 0.5 tablets (5 mg total) by mouth 2 (two) times daily. 60 tablet 5  . sertraline (ZOLOFT) 50 MG tablet Take 1 tablet (50 mg total) by mouth daily. 90 tablet 3   Current Facility-Administered Medications on File Prior to Visit  Medication Dose Route Frequency Provider Last Rate Last Dose  . cyanocobalamin ((VITAMIN B-12)) injection 1,000 mcg  1,000 mcg Intramuscular Q30 days Remus Loffler, PA-C   1,000 mcg at 01/14/17 1142    ALLERGIES: No Known Allergies  FAMILY HISTORY: Family History  Problem Relation Age of Onset  . Healthy Sister   . Healthy Brother   .  Throat cancer Brother   . Stroke Brother   . Heart disease Brother     SOCIAL HISTORY: Social History   Social History  . Marital status: Divorced    Spouse name: N/A  . Number of children: N/A  . Years of education: N/A   Occupational History  . Not on file.   Social History Main Topics  . Smoking status: Former Smoker    Types:  Cigarettes    Quit date: 02/20/2001  . Smokeless tobacco: Never Used  . Alcohol use No  . Drug use: No  . Sexual activity: Not on file   Other Topics Concern  . Not on file   Social History Narrative  . No narrative on file    REVIEW OF SYSTEMS: Constitutional: No fevers, chills, or sweats, no generalized fatigue, change in appetite Eyes: No visual changes, double vision, eye pain Ear, nose and throat: No hearing loss, ear pain, nasal congestion, sore throat Cardiovascular: No chest pain, palpitations Respiratory:  No shortness of breath at rest or with exertion, wheezes GastrointestinaI: No nausea, vomiting, diarrhea, abdominal pain, fecal incontinence Genitourinary:  No dysuria, urinary retention or frequency Musculoskeletal:  No neck pain, back pain Integumentary: No rash, pruritus, skin lesions Neurological: as above Psychiatric: No depression, insomnia, anxiety Endocrine: No palpitations, fatigue, diaphoresis, mood swings, change in appetite, change in weight, increased thirst Hematologic/Lymphatic:  No anemia, purpura, petechiae. Allergic/Immunologic: no itchy/runny eyes, nasal congestion, recent allergic reactions, rashes  PHYSICAL EXAM: Vitals:   03/20/17 1126  BP: (!) 144/62  Pulse: 96  SpO2: 98%   General: No acute distress Head:  Normocephalic/atraumatic Eyes: Fundoscopic exam shows bilateral sharp discs, no vessel changes, exudates, or hemorrhages Neck: supple, no paraspinal tenderness, full range of motion Back: No paraspinal tenderness Heart: regular rate and rhythm Lungs: Clear to auscultation bilaterally. Vascular: No carotid bruits. Skin/Extremities: No rash, no edema Neurological Exam: Mental status: alert and oriented to person, place, and time, no dysarthria or aphasia, Fund of knowledge is appropriate.  Recent and remote memory are impaired.  Attention and concentration are normal.    Able to name objects and repeat phrases.  Montreal Cognitive  Assessment  03/20/2017  Visuospatial/ Executive (0/5) 5  Naming (0/3) 3  Attention: Read list of digits (0/2) 2  Attention: Read list of letters (0/1) 1  Attention: Serial 7 subtraction starting at 100 (0/3) 3  Language: Repeat phrase (0/2) 2  Language : Fluency (0/1) 1  Abstraction (0/2) 2  Delayed Recall (0/5) 0  Orientation (0/6) 4  Total 23   Cranial nerves: CN I: not tested CN II: pupils equal, round and reactive to light, visual fields intact, fundi unremarkable. CN III, IV, VI:  full range of motion, no nystagmus, no ptosis CN V: facial sensation intact CN VII: upper and lower face symmetric CN VIII: hearing intact to finger rub CN IX, X: gag intact, uvula midline CN XI: sternocleidomastoid and trapezius muscles intact CN XII: tongue midline Bulk & Tone: normal, no fasciculations. Motor: 5/5 throughout with no pronator drift. Sensation: intact to light touch, cold, pin, vibration and joint position sense.  No extinction to double simultaneous stimulation.  Romberg test negative Deep Tendon Reflexes: +2 throughout, no ankle clonus Plantar responses: downgoing bilaterally Cerebellar: no incoordination on finger to nose testing Gait: narrow-based and steady, able to tandem walk adequately. Tremor: none  IMPRESSION: This is a [age] year old right-handed woman with a history of hypertension, depression, chronic diarrhea, presenting for evaluation of dementia. Her  neurological exam is non-focal, MOCA score today 23/30, indicating mild cognitive impairment. It appears from history that she is able to continue to manage complex tasks such as finances and medications. She had side effects on Aricept, continue Namenda 5mg  BID. We discussed side effects and expectations from the medication. We discussed the importance of home safety, her daughter will start monitoring that she is taking medications regularly. Continue to monitor driving as well. We discussed the importance of control of  vascular risk factors, physical exercise, and brain stimulation exercises for brain health. She will follow-up in 6 months and knows to call for any changes.   Thank you for allowing me to participate in the care of this patient. Please do not hesitate to call for any questions or concerns.   Patrcia Dolly, M.D.  CC: Prudy Feeler, PA-C

## 2017-03-31 DIAGNOSIS — G3184 Mild cognitive impairment, so stated: Secondary | ICD-10-CM | POA: Insufficient documentation

## 2017-04-16 ENCOUNTER — Other Ambulatory Visit: Payer: Self-pay | Admitting: Physician Assistant

## 2017-04-16 DIAGNOSIS — I10 Essential (primary) hypertension: Secondary | ICD-10-CM

## 2017-04-16 MED ORDER — BENAZEPRIL HCL 40 MG PO TABS: 40.0000 mg | ORAL_TABLET | Freq: Every day | ORAL | 1 refills | Status: DC

## 2017-04-16 NOTE — Telephone Encounter (Signed)
Rx sent to pharmacy   

## 2017-04-16 NOTE — Telephone Encounter (Signed)
What is the name of the medication? benazepril (LOTENSIN) 40 MG tabletbenazepril (LOTENSIN) 40 MG tablet  Have you contacted your pharmacy to request a refill? No was told to call them  Which pharmacy would you like this sent to? [facility]    Patient notified that their request is being sent to the clinical staff for review and that they should receive a call once it is complete. If they do not receive a call within 24 hours they can check with their pharmacy or our office.

## 2017-06-23 ENCOUNTER — Encounter: Payer: Self-pay | Admitting: Physician Assistant

## 2017-06-23 ENCOUNTER — Encounter (INDEPENDENT_AMBULATORY_CARE_PROVIDER_SITE_OTHER): Payer: Self-pay

## 2017-06-23 ENCOUNTER — Ambulatory Visit (INDEPENDENT_AMBULATORY_CARE_PROVIDER_SITE_OTHER): Payer: Medicare Other | Admitting: Physician Assistant

## 2017-06-23 VITALS — BP 181/79 | HR 54 | Temp 96.8°F | Ht 63.0 in | Wt 113.8 lb

## 2017-06-23 DIAGNOSIS — R5383 Other fatigue: Secondary | ICD-10-CM

## 2017-06-23 DIAGNOSIS — Z0000 Encounter for general adult medical examination without abnormal findings: Secondary | ICD-10-CM

## 2017-06-23 DIAGNOSIS — E538 Deficiency of other specified B group vitamins: Secondary | ICD-10-CM

## 2017-06-23 DIAGNOSIS — F0280 Dementia in other diseases classified elsewhere without behavioral disturbance: Secondary | ICD-10-CM

## 2017-06-23 DIAGNOSIS — I10 Essential (primary) hypertension: Secondary | ICD-10-CM | POA: Diagnosis not present

## 2017-06-23 DIAGNOSIS — G309 Alzheimer's disease, unspecified: Secondary | ICD-10-CM | POA: Diagnosis not present

## 2017-06-23 LAB — URINALYSIS, COMPLETE
Bilirubin, UA: NEGATIVE
Glucose, UA: NEGATIVE
NITRITE UA: NEGATIVE
PH UA: 6 (ref 5.0–7.5)
PROTEIN UA: NEGATIVE
Specific Gravity, UA: 1.02 (ref 1.005–1.030)
Urobilinogen, Ur: 0.2 mg/dL (ref 0.2–1.0)

## 2017-06-23 LAB — MICROSCOPIC EXAMINATION: RENAL EPITHEL UA: NONE SEEN /HPF

## 2017-06-23 NOTE — Patient Instructions (Signed)
In a few days you may receive a survey in the mail or online from [employer] regarding your visit with us today. Please take a moment to fill this out. Your feedback is very important to our whole office. It can help us better understand your needs as well as improve your experience and satisfaction. Thank you for taking your time to complete it. We care about you.  Danah Reinecke, PA-C  

## 2017-06-24 LAB — CMP14+EGFR
ALBUMIN: 4.3 g/dL (ref 3.5–4.8)
ALK PHOS: 88 IU/L (ref 39–117)
ALT: 11 IU/L (ref 0–32)
AST: 13 IU/L (ref 0–40)
Albumin/Globulin Ratio: 2 (ref 1.2–2.2)
BILIRUBIN TOTAL: 0.2 mg/dL (ref 0.0–1.2)
BUN / CREAT RATIO: 9 — AB (ref 12–28)
BUN: 9 mg/dL (ref 8–27)
CHLORIDE: 109 mmol/L — AB (ref 96–106)
CO2: 27 mmol/L (ref 20–29)
Calcium: 9.2 mg/dL (ref 8.7–10.3)
Creatinine, Ser: 0.99 mg/dL (ref 0.57–1.00)
GFR calc Af Amer: 63 mL/min/{1.73_m2} (ref >59)
GFR calc non Af Amer: 55 mL/min/{1.73_m2} — ABNORMAL LOW (ref >59)
GLUCOSE: 103 mg/dL — AB (ref 65–99)
Globulin, Total: 2.2 g/dL (ref 1.5–4.5)
Potassium: 4 mmol/L (ref 3.5–5.2)
Sodium: 147 mmol/L — ABNORMAL HIGH (ref 134–144)
Total Protein: 6.5 g/dL (ref 6.0–8.5)

## 2017-06-24 LAB — CBC WITH DIFFERENTIAL/PLATELET
BASOS ABS: 0 10*3/uL (ref 0.0–0.2)
Basos: 0 %
EOS (ABSOLUTE): 0.1 10*3/uL (ref 0.0–0.4)
Eos: 2 %
HEMATOCRIT: 37.8 % (ref 34.0–46.6)
Hemoglobin: 12.6 g/dL (ref 11.1–15.9)
Immature Grans (Abs): 0 10*3/uL (ref 0.0–0.1)
Immature Granulocytes: 0 %
LYMPHS ABS: 2.1 10*3/uL (ref 0.7–3.1)
Lymphs: 30 %
MCH: 30.4 pg (ref 26.6–33.0)
MCHC: 33.3 g/dL (ref 31.5–35.7)
MCV: 91 fL (ref 79–97)
MONOS ABS: 0.5 10*3/uL (ref 0.1–0.9)
Monocytes: 7 %
NEUTROS ABS: 4.2 10*3/uL (ref 1.4–7.0)
Neutrophils: 61 %
Platelets: 189 10*3/uL (ref 150–379)
RBC: 4.14 x10E6/uL (ref 3.77–5.28)
RDW: 13.6 % (ref 12.3–15.4)
WBC: 6.9 10*3/uL (ref 3.4–10.8)

## 2017-06-24 LAB — TSH: TSH: 1.59 u[IU]/mL (ref 0.450–4.500)

## 2017-06-24 NOTE — Progress Notes (Signed)
BP (!) 181/79   Pulse (!) 54   Temp (!) 96.8 F (36 C) (Oral)   Ht '5\' 3"'  (1.6 m)   Wt 113 lb 12.8 oz (51.6 kg)   BMI 20.16 kg/m    Subjective:    Patient ID: Susan Bennett, female    DOB: March 29, 1941, 77 y.o.   MRN: [id number]  HPI: Susan Bennett is a 77 y.o. female presenting on 06/23/2017 for Fatigue; Back Pain; and Sinusitis  Patient comes in for recheck on her dementia.  She has not been taking her medication regularly.  There is a twice daily Namenda that she is supposed to take.  She has been quite resistant in taking it Bennett to her husband and her daughter.  Daughter is present in the room today.  We have all contracted that she will use the pillbox and take the medication twice daily.  She is fairly routine and taking the rest of her medications.  She has not been taking her blood pressure medicine well in the past week.  I have encouraged the daughter to get her to take the medication in front of her.  We will draw labs today to see if there is any abnormalities there.  She is having a little bit of upper respiratory congestion.  She denies fever or chills.  Relevant past medical, surgical, family and social history reviewed and updated as indicated. Allergies and medications reviewed and updated.  Past Medical History:  Diagnosis Date  . Chronic diarrhea   . Contact dermatitis and other eczema, due to unspecified cause   . Depressive disorder, not elsewhere classified   . Osteoporosis, unspecified   . Other and unspecified hyperlipidemia   . Other B-complex deficiencies   . Unspecified essential hypertension   . Unspecified gastritis and gastroduodenitis without mention of hemorrhage   . Unspecified vitamin D deficiency     Past Surgical History:  Procedure Laterality Date  . COLONOSCOPY     9 YEARS AGO    Review of Systems  Constitutional: Positive for fatigue. Negative for activity change and fever.  HENT: Negative.   Eyes: Negative.   Respiratory: Negative.   Negative for cough.   Cardiovascular: Negative.  Negative for chest pain.  Gastrointestinal: Negative.  Negative for abdominal pain.  Endocrine: Negative.   Genitourinary: Negative.  Negative for dysuria.  Musculoskeletal: Positive for arthralgias.  Skin: Negative.   Neurological: Positive for headaches.    Allergies as of 06/23/2017   No Known Allergies     Medication List        Accurate as of 06/23/17 11:59 PM. Always use your most recent med list.          ALPRAZolam 0.25 MG tablet Commonly known as:  XANAX Take 0.25 mg by mouth 2 (two) times daily as needed for anxiety.   benazepril 40 MG tablet Commonly known as:  LOTENSIN Take 1 tablet (40 mg total) daily by mouth.   memantine 10 MG tablet Commonly known as:  NAMENDA Take 0.5 tablets (5 mg total) by mouth 2 (two) times daily.   sertraline 50 MG tablet Commonly known as:  ZOLOFT Take 1 tablet (50 mg total) by mouth daily.          Objective:    BP (!) 181/79   Pulse (!) 54   Temp (!) 96.8 F (36 C) (Oral)   Ht '5\' 3"'  (1.6 m)   Wt 113 lb 12.8 oz (51.6 kg)   BMI 20.16  kg/m   No Known Allergies  Physical Exam  Constitutional: She is oriented to person, place, and time. She appears well-developed and well-nourished.  HENT:  Head: Normocephalic and atraumatic.  Eyes: Conjunctivae and EOM are normal. Pupils are equal, round, and reactive to light.  Cardiovascular: Normal rate, regular rhythm, normal heart sounds and intact distal pulses.  Pulmonary/Chest: Effort normal and breath sounds normal.  Abdominal: Soft. Bowel sounds are normal.  Neurological: She is alert and oriented to person, place, and time. She has normal reflexes.  Skin: Skin is warm and dry. No rash noted.  Psychiatric: She has a normal mood and affect. Her behavior is normal. Judgment and thought content normal.  Nursing note and vitals reviewed.   Results for orders placed or performed in visit on 06/23/17  Microscopic Examination    Result Value Ref Range   WBC, UA 6-10 (A) 0 - 5 /hpf   RBC, UA 0-2 0 - 2 /hpf   Epithelial Cells (non renal) 0-10 0 - 10 /hpf   Renal Epithel, UA None seen None seen /hpf   Bacteria, UA Few (A) None seen/Few  CBC with Differential/Platelet  Result Value Ref Range   WBC 6.9 3.4 - 10.8 x10E3/uL   RBC 4.14 3.77 - 5.28 x10E6/uL   Hemoglobin 12.6 11.1 - 15.9 g/dL   Hematocrit 37.8 34.0 - 46.6 %   MCV 91 79 - 97 fL   MCH 30.4 26.6 - 33.0 pg   MCHC 33.3 31.5 - 35.7 g/dL   RDW 13.6 12.3 - 15.4 %   Platelets 189 150 - 379 x10E3/uL   Neutrophils 61 Not Estab. %   Lymphs 30 Not Estab. %   Monocytes 7 Not Estab. %   Eos 2 Not Estab. %   Basos 0 Not Estab. %   Neutrophils Absolute 4.2 1.4 - 7.0 x10E3/uL   Lymphocytes Absolute 2.1 0.7 - 3.1 x10E3/uL   Monocytes Absolute 0.5 0.1 - 0.9 x10E3/uL   EOS (ABSOLUTE) 0.1 0.0 - 0.4 x10E3/uL   Basophils Absolute 0.0 0.0 - 0.2 x10E3/uL   Immature Granulocytes 0 Not Estab. %   Immature Grans (Abs) 0.0 0.0 - 0.1 x10E3/uL  CMP14+EGFR  Result Value Ref Range   Glucose 103 (H) 65 - 99 mg/dL   BUN 9 8 - 27 mg/dL   Creatinine, Ser 0.99 0.57 - 1.00 mg/dL   GFR calc non Af Amer 55 (L) >59 mL/min/1.73   GFR calc Af Amer 63 >59 mL/min/1.73   BUN/Creatinine Ratio 9 (L) 12 - 28   Sodium 147 (H) 134 - 144 mmol/L   Potassium 4.0 3.5 - 5.2 mmol/L   Chloride 109 (H) 96 - 106 mmol/L   CO2 27 20 - 29 mmol/L   Calcium 9.2 8.7 - 10.3 mg/dL   Total Protein 6.5 6.0 - 8.5 g/dL   Albumin 4.3 3.5 - 4.8 g/dL   Globulin, Total 2.2 1.5 - 4.5 g/dL   Albumin/Globulin Ratio 2.0 1.2 - 2.2   Bilirubin Total 0.2 0.0 - 1.2 mg/dL   Alkaline Phosphatase 88 39 - 117 IU/L   AST 13 0 - 40 IU/L   ALT 11 0 - 32 IU/L  TSH  Result Value Ref Range   TSH 1.590 0.450 - 4.500 uIU/mL  Urinalysis, Complete  Result Value Ref Range   Specific Gravity, UA 1.020 1.005 - 1.030   pH, UA 6.0 5.0 - 7.5   Color, UA Yellow Yellow   Appearance Ur Clear Clear   Leukocytes,  UA 1+ (A) Negative    Protein, UA Negative Negative/Trace   Glucose, UA Negative Negative   Ketones, UA Trace (A) Negative   RBC, UA Trace (A) Negative   Bilirubin, UA Negative Negative   Urobilinogen, Ur 0.2 0.2 - 1.0 mg/dL   Nitrite, UA Negative Negative   Microscopic Examination See below:       Assessment & Plan:   1. Alzheimer's dementia without behavioral disturbance, unspecified timing of dementia onset Significant memory loss Encourage twice daily Namenda Pillbox being planned Keep follow-up with neurology  2. Well adult exam - CBC with Differential/Platelet - CMP14+EGFR - TSH - Urine Culture - Urinalysis, Complete - Microscopic Examination  3. Other fatigue - CBC with Differential/Platelet - CMP14+EGFR - TSH - Urine Culture - Urinalysis, Complete - Microscopic Examination  4. Essential hypertension  5. Vitamin B12 deficiency    Current Outpatient Medications:  .  ALPRAZolam (XANAX) 0.25 MG tablet, Take 0.25 mg by mouth 2 (two) times daily as needed for anxiety., Disp: , Rfl:  .  benazepril (LOTENSIN) 40 MG tablet, Take 1 tablet (40 mg total) daily by mouth., Disp: 90 tablet, Rfl: 1 .  memantine (NAMENDA) 10 MG tablet, Take 0.5 tablets (5 mg total) by mouth 2 (two) times daily., Disp: 60 tablet, Rfl: 5 .  sertraline (ZOLOFT) 50 MG tablet, Take 1 tablet (50 mg total) by mouth daily., Disp: 90 tablet, Rfl: 3  Current Facility-Administered Medications:  .  cyanocobalamin ((VITAMIN B-12)) injection 1,000 mcg, 1,000 mcg, Intramuscular, Q30 days, Particia Nearing S, PA-C, 1,000 mcg at 01/14/17 1142 Continue all other maintenance medications as listed above.  Follow up plan: Return in about 3 months (around 09/21/2017) for recheckj.  Educational handout given for Ruston PA-C [place] [address]  [place], [place] [address] [phone]   06/24/2017, 1:56 PM

## 2017-06-26 LAB — URINE CULTURE

## 2017-06-27 ENCOUNTER — Other Ambulatory Visit: Payer: Self-pay | Admitting: Physician Assistant

## 2017-06-27 MED ORDER — CIPROFLOXACIN HCL 250 MG PO TABS: 250.0000 mg | ORAL_TABLET | Freq: Two times a day (BID) | ORAL | 0 refills | Status: DC

## 2017-06-30 ENCOUNTER — Telehealth: Payer: Self-pay | Admitting: Physician Assistant

## 2017-06-30 NOTE — Telephone Encounter (Signed)
Patients daughter aware

## 2017-06-30 NOTE — Telephone Encounter (Signed)
Okay to hold the cipro and just let us know if anything gets worse

## 2017-06-30 NOTE — Telephone Encounter (Signed)
Patients daughter states that patient was sitting in chair and that she started bleeding from her leg for no apparent reason. Patients daughter wants to know if she should continue cipro?

## 2017-09-22 ENCOUNTER — Ambulatory Visit: Payer: Medicare Other | Admitting: Neurology

## 2017-10-15 ENCOUNTER — Other Ambulatory Visit: Payer: Self-pay | Admitting: Physician Assistant

## 2017-10-15 DIAGNOSIS — I10 Essential (primary) hypertension: Secondary | ICD-10-CM

## 2017-10-15 MED ORDER — BENAZEPRIL HCL 40 MG PO TABS: 40.0000 mg | ORAL_TABLET | Freq: Every day | ORAL | 0 refills | Status: DC

## 2017-10-15 NOTE — Telephone Encounter (Signed)
Aware refill sent to pharmacy ?

## 2017-10-21 ENCOUNTER — Encounter: Payer: Self-pay | Admitting: Physician Assistant

## 2017-10-21 ENCOUNTER — Ambulatory Visit (INDEPENDENT_AMBULATORY_CARE_PROVIDER_SITE_OTHER): Payer: Medicare Other | Admitting: Physician Assistant

## 2017-10-21 VITALS — BP 141/59 | HR 56 | Ht 63.0 in | Wt 109.6 lb

## 2017-10-21 DIAGNOSIS — G8929 Other chronic pain: Secondary | ICD-10-CM

## 2017-10-21 DIAGNOSIS — E538 Deficiency of other specified B group vitamins: Secondary | ICD-10-CM

## 2017-10-21 DIAGNOSIS — M545 Low back pain, unspecified: Secondary | ICD-10-CM

## 2017-10-21 MED ORDER — CLOTRIMAZOLE-BETAMETHASONE 1-0.05 % EX CREA: 1.0000 "application " | TOPICAL_CREAM | Freq: Two times a day (BID) | CUTANEOUS | 5 refills | Status: DC

## 2017-10-21 NOTE — Patient Instructions (Signed)
In a few days you may receive a survey in the mail or online from [employer] regarding your visit with us today. Please take a moment to fill this out. Your feedback is very important to our whole office. It can help us better understand your needs as well as improve your experience and satisfaction. Thank you for taking your time to complete it. We care about you.  Mataeo Ingwersen, PA-C  

## 2017-10-22 NOTE — Progress Notes (Signed)
BP (!) 141/59   Pulse (!) 56   Ht  (1.6 m)   Wt 109 lb 9.6 oz (49.7 kg)   BMI 19.41 kg/m    Subjective:    Patient ID: Susan Bennett, female    DOB: 1940-07-01, 77 y.o.   MRN: [id number]  HPI: Susan Bennett is a 77 y.o. female presenting on 10/21/2017 for Back Pain; Hypertension; and Dementia  This patient comes in for a 40-month recheck on her medical conditions.  They are positive for dementia, hypertension, chronic back pain.  She states that the pain is primarily in the lumbar spine.  It hurts the most when she goes from sitting to standing and vice versa.  If she is still for a while it does not bother her.  There is nothing else that seems to aggravate it.  She does need refill on her Lotrisone for her chronic rash.  Discussion concerning getting her B12 monthly in order for her to have some improvement in the condition, which can improve her dementia also.  They seem to understand this.  Her daughter is present in the room with her.   Past Medical History:  Diagnosis Date  . Chronic diarrhea   . Contact dermatitis and other eczema, due to unspecified cause   . Depressive disorder, not elsewhere classified   . Osteoporosis, unspecified   . Other and unspecified hyperlipidemia   . Other B-complex deficiencies   . Unspecified essential hypertension   . Unspecified gastritis and gastroduodenitis without mention of hemorrhage   . Unspecified vitamin D deficiency    Relevant past medical, surgical, family and social history reviewed and updated as indicated. Interim medical history since our last visit reviewed. Allergies and medications reviewed and updated. DATA REVIEWED: CHART IN EPIC  Family History reviewed for pertinent findings.  Review of Systems  Constitutional: Negative.  Negative for activity change, fatigue and fever.  HENT: Negative.   Eyes: Negative.   Respiratory: Negative.  Negative for cough.   Cardiovascular: Negative.  Negative for chest pain.    Gastrointestinal: Negative.  Negative for abdominal pain.  Endocrine: Negative.   Genitourinary: Negative.  Negative for dysuria.  Musculoskeletal: Positive for arthralgias, back pain and myalgias.  Skin: Negative.   Neurological: Negative.     Allergies as of 10/21/2017   No Known Allergies     Medication List        Accurate as of 10/21/17 11:59 PM. Always use your most recent med list.          ALPRAZolam 0.25 MG tablet Commonly known as:  XANAX Take 0.25 mg by mouth 2 (two) times daily as needed for anxiety.   benazepril 40 MG tablet Commonly known as:  LOTENSIN Take 1 tablet (40 mg total) by mouth daily.   clotrimazole-betamethasone cream Commonly known as:  LOTRISONE Apply 1 application topically 2 (two) times daily.   memantine 10 MG tablet Commonly known as:  NAMENDA Take 0.5 tablets (5 mg total) by mouth 2 (two) times daily.   sertraline 50 MG tablet Commonly known as:  ZOLOFT Take 1 tablet (50 mg total) by mouth daily.          Objective:    BP (!) 141/59   Pulse (!) 56   Ht  (1.6 m)   Wt 109 lb 9.6 oz (49.7 kg)   BMI 19.41 kg/m   No Known Allergies  Wt Readings from Last 3 Encounters:  10/21/17 109 lb 9.6  oz (49.7 kg)  06/23/17 113 lb 12.8 oz (51.6 kg)  01/14/17 112 lb 6.4 oz (51 kg)    Physical Exam  Constitutional: She is oriented to person, place, and time. She appears well-developed and well-nourished.  HENT:  Head: Normocephalic and atraumatic.  Eyes: Pupils are equal, round, and reactive to light. Conjunctivae and EOM are normal.  Cardiovascular: Normal rate, regular rhythm, normal heart sounds and intact distal pulses.  Pulmonary/Chest: Effort normal and breath sounds normal.  Abdominal: Soft. Bowel sounds are normal.  Musculoskeletal:       Lumbar back: She exhibits tenderness and pain. She exhibits normal range of motion.       Back:  Neurological: She is alert and oriented to person, place, and time. She has normal  reflexes.  Skin: Skin is warm and dry. No rash noted.  Psychiatric: She has a normal mood and affect. Her behavior is normal. Judgment and thought content normal.        Assessment & Plan:   1. Chronic midline low back pain without sciatica Stretches,, Tylenol as needed heat  2. Vitamin B12 deficiency B12 injection monthly   Continue all other maintenance medications as listed above.  Follow up plan: Return in about 6 months (around 04/23/2018) for AWV soon, 6 months recheck.  Educational handout given for survey  Remus Loffler PA-C [facility] [address]  [place], [place] [address] [phone]   10/22/2017, 11:35 AM

## 2017-12-09 ENCOUNTER — Ambulatory Visit (INDEPENDENT_AMBULATORY_CARE_PROVIDER_SITE_OTHER): Payer: Medicare Other

## 2017-12-09 ENCOUNTER — Ambulatory Visit (INDEPENDENT_AMBULATORY_CARE_PROVIDER_SITE_OTHER): Payer: Medicare Other | Admitting: Physician Assistant

## 2017-12-09 ENCOUNTER — Encounter: Payer: Self-pay | Admitting: Physician Assistant

## 2017-12-09 VITALS — BP 139/60 | HR 61 | Temp 97.4°F | Ht 63.0 in | Wt 110.8 lb

## 2017-12-09 DIAGNOSIS — G8929 Other chronic pain: Secondary | ICD-10-CM

## 2017-12-09 DIAGNOSIS — E538 Deficiency of other specified B group vitamins: Secondary | ICD-10-CM | POA: Diagnosis not present

## 2017-12-09 DIAGNOSIS — M5136 Other intervertebral disc degeneration, lumbar region: Secondary | ICD-10-CM | POA: Diagnosis not present

## 2017-12-09 DIAGNOSIS — M545 Low back pain: Secondary | ICD-10-CM

## 2017-12-09 MED ORDER — DICLOFENAC SODIUM 50 MG PO TBEC: 50.0000 mg | DELAYED_RELEASE_TABLET | Freq: Two times a day (BID) | ORAL | 2 refills | Status: DC

## 2017-12-09 NOTE — Progress Notes (Signed)
BP 139/60   Pulse 61   Temp (!) 97.4 F (36.3 C) (Oral)   Ht 5\' 3"  (1.6 m)   Wt 110 lb 12.8 oz (50.3 kg)   BMI 19.63 kg/m    Subjective:    Patient ID: Susan Bennett, female    DOB: March 25, 1941, 77 y.o.   MRN: [id number]  HPI: Susan Bennett is a 77 y.o. female presenting on 12/09/2017 for Back Pain (right side) and Hip Pain (right side )  This patient comes in complaining of right lower back pain.  It has been off and on for many months.  In the past week it has been much more severe.  She states that she was doing some work out in her yard.  She has tried heat and Tylenol.  She does not know of any other specific injury.  She states she is very stiff and sore each morning and that lasts about 30 minutes.  The patient also has B12 deficiency and is due her vitamin B12 injection today.  Past Medical History:  Diagnosis Date  . Chronic diarrhea   . Contact dermatitis and other eczema, due to unspecified cause   . Depressive disorder, not elsewhere classified   . Osteoporosis, unspecified   . Other and unspecified hyperlipidemia   . Other B-complex deficiencies   . Unspecified essential hypertension   . Unspecified gastritis and gastroduodenitis without mention of hemorrhage   . Unspecified vitamin D deficiency    Relevant past medical, surgical, family and social history reviewed and updated as indicated. Interim medical history since our last visit reviewed. Allergies and medications reviewed and updated. DATA REVIEWED: CHART IN EPIC  Family History reviewed for pertinent findings.  Review of Systems  Constitutional: Negative.   HENT: Negative.   Eyes: Negative.   Respiratory: Negative.   Gastrointestinal: Negative.   Genitourinary: Negative.   Musculoskeletal: Positive for back pain and myalgias.    Allergies as of 12/09/2017   No Known Allergies     Medication List        Accurate as of 12/09/17  1:28 PM. Always use your most recent med list.            ALPRAZolam 0.25 MG tablet Commonly known as:  XANAX Take 0.25 mg by mouth 2 (two) times daily as needed for anxiety.   benazepril 40 MG tablet Commonly known as:  LOTENSIN Take 1 tablet (40 mg total) by mouth daily.   clotrimazole-betamethasone cream Commonly known as:  LOTRISONE Apply 1 application topically 2 (two) times daily.   diclofenac 50 MG EC tablet Commonly known as:  VOLTAREN Take 1 tablet (50 mg total) by mouth 2 (two) times daily.   memantine 10 MG tablet Commonly known as:  NAMENDA Take 0.5 tablets (5 mg total) by mouth 2 (two) times daily.   sertraline 50 MG tablet Commonly known as:  ZOLOFT Take 1 tablet (50 mg total) by mouth daily.          Objective:    BP 139/60   Pulse 61   Temp (!) 97.4 F (36.3 C) (Oral)   Ht 5\' 3"  (1.6 m)   Wt 110 lb 12.8 oz (50.3 kg)   BMI 19.63 kg/m   No Known Allergies  Wt Readings from Last 3 Encounters:  12/09/17 110 lb 12.8 oz (50.3 kg)  10/21/17 109 lb 9.6 oz (49.7 kg)  06/23/17 113 lb 12.8 oz (51.6 kg)    Physical Exam  Constitutional:  She is oriented to person, place, and time. She appears well-developed and well-nourished.  HENT:  Head: Normocephalic and atraumatic.  Eyes: Pupils are equal, round, and reactive to light. Conjunctivae and EOM are normal.  Cardiovascular: Normal rate, regular rhythm, normal heart sounds and intact distal pulses.  Pulmonary/Chest: Effort normal and breath sounds normal.  Abdominal: Soft. Bowel sounds are normal.  Musculoskeletal:       Lumbar back: She exhibits decreased range of motion, tenderness, pain and spasm.       Back:  Neurological: She is alert and oriented to person, place, and time. She has normal reflexes.  Skin: Skin is warm and dry. No rash noted.  Psychiatric: She has a normal mood and affect. Her behavior is normal. Judgment and thought content normal.        Assessment & Plan:   1. Chronic right-sided low back pain without sciatica - DG Lumbar Spine  2-3 Views; Future  2. Vitamin B12 deficiency injection   Continue all other maintenance medications as listed above.  Follow up plan: No follow-ups on file.  Educational handout given for survey  Remus LofflerAngel S. Lorrie Strauch PA-C [facility] [address]  [place], [place] [id number] [phone]   12/09/2017, 1:28 PM

## 2017-12-10 ENCOUNTER — Other Ambulatory Visit: Payer: Self-pay | Admitting: *Deleted

## 2017-12-10 DIAGNOSIS — M545 Low back pain: Principal | ICD-10-CM

## 2017-12-10 DIAGNOSIS — G8929 Other chronic pain: Secondary | ICD-10-CM

## 2017-12-16 ENCOUNTER — Ambulatory Visit: Payer: Medicare Other | Admitting: Physical Therapy

## 2017-12-18 ENCOUNTER — Ambulatory Visit: Payer: Medicare Other | Admitting: Physical Therapy

## 2017-12-25 ENCOUNTER — Other Ambulatory Visit: Payer: Medicare Other

## 2017-12-25 ENCOUNTER — Other Ambulatory Visit: Payer: Self-pay

## 2017-12-25 ENCOUNTER — Ambulatory Visit: Payer: Medicare Other | Attending: Physician Assistant | Admitting: Physical Therapy

## 2017-12-25 DIAGNOSIS — M545 Low back pain, unspecified: Secondary | ICD-10-CM

## 2017-12-25 NOTE — Therapy (Signed)
[facility] [address] [place], [place], [address] Phone: [phone]   Fax:  [phone]  Physical Therapy Evaluation  Patient Details  Name: Susan Bennett MRN: [id number] Date of Birth: 11-05-1940 Referring Provider: Prudy Feeler PA-C.   Encounter Date: 12/25/2017  PT End of Session - 12/25/17 1421    Visit Number  1    Number of Visits  16    Date for PT Re-Evaluation  03/25/18    Authorization Type  FOTO AT LEAST EVERY 5TH VISIT, 10TH VISIT PROGRESS NOTE AND KX MODIFIER AFTER THE 15 VISIT.    PT Start Time  0145    PT Stop Time  0239    PT Time Calculation (min)  54 min    Activity Tolerance  Patient tolerated treatment well    Behavior During Therapy  WFL for tasks assessed/performed       Past Medical History:  Diagnosis Date  . Chronic diarrhea   . Contact dermatitis and other eczema, due to unspecified cause   . Depressive disorder, not elsewhere classified   . Osteoporosis, unspecified   . Other and unspecified hyperlipidemia   . Other B-complex deficiencies   . Unspecified essential hypertension   . Unspecified gastritis and gastroduodenitis without mention of hemorrhage   . Unspecified vitamin D deficiency     Past Surgical History:  Procedure Laterality Date  . COLONOSCOPY     9 YEARS AGO    There were no vitals filed for this visit.   Subjective Assessment - 12/25/17 1422    Subjective  The patient presents to outpatient physical therapy today with c/o right sided low back pain that has been ongoing over the last couple of months.  Patient thinks this may be related to lifting her 19# dog a lot.  She has been on a heating pad at home and in fact has a mottled look to the skin in her low back because of it.  Patient with family member today to monitor this.  Her pain at rets today is a /10but can rise to higher levels with movement. Rest and heat decrease pain.    Patient is accompained by:  -- Niece.    Pertinent History  OP; some cognitive impairment.    Diagnostic tests  X-ray...please look under "imaging" tab.    Patient Stated Goals  Get out of pain.    Currently in Pain?  Yes    Pain Score  4     Pain Location  Back    Pain Orientation  Right    Pain Descriptors / Indicators  Aching;Throbbing    Pain Type  Acute pain    Pain Onset  More than a month ago    Pain Frequency  Constant    Aggravating Factors   See above.    Pain Relieving Factors  See above.         [facility] PT Assessment - 12/25/17 0001      Assessment   Medical Diagnosis  Right sided low back pain.    Referring Provider  Prudy Feeler PA-C.    Onset Date/Surgical Date  -- ~2 months ago.      Precautions   Precautions  -- OP.      Restrictions   Weight Bearing Restrictions  No      Balance Screen   Has the patient fallen in the past 6 months  No    Has the patient had a decrease in activity level because  of a fear of falling?   No    Is the patient reluctant to leave their home because of a fear of falling?   No      Home Public house managernvironment   Living Environment  Private residence      Prior Function   Level of Independence  Independent      Cognition   Behaviors  -- Follow commands.      Observation/Other Assessments   Focus on Therapeutic Outcomes (FOTO)   68% limitation.      Posture/Postural Control   Posture/Postural Control  Postural limitations    Postural Limitations  Rounded Shoulders;Forward head      ROM / Strength   AROM / PROM / Strength  AROM;Strength      AROM   Overall AROM Comments  Deferred spinal range of motion testing due to OP.  Bilateral LE range of motion is WNL.      Strength   Overall Strength Comments  Normal bilateral LE strength.      Palpation   Palpation comment  Tender to palpation over right quadratus Lumborum and right iliolumbar ligament.      Special Tests   Other special tests  Bilateral patellar reflexes= 2+ to 3+/4+ and bilateral Achille's= 1+/4+; 1/4 inch  leg length discrepancy (right shorter than left). (-) SLR and FABER testing.      Ambulation/Gait   Gait Comments  Slow and cautious due to pain.                Objective measurements completed on examination: See above findings.                PT Short Term Goals - 12/25/17 1454      PT SHORT TERM GOAL #1   Title  STG's=LTG's.        PT Long Term Goals - 12/25/17 1454      PT LONG TERM GOAL #1   Title  Independent with HEP.    Time  8    Period  Weeks    Status  New      PT LONG TERM GOAL #2   Title  Perform ADL's with pain not > 2/10.    Time  8    Period  Weeks    Status  New      PT LONG TERM GOAL #3   Title  Lift dog with pain not > 2/10.    Time  8    Period  Weeks    Status  New             Plan - 12/25/17 1444    Clinical Impression Statement  The patient presents to OPPT with right sided low abck pain that has been ongoing for about 2 months.  Most notable was an active trigger point in her right Quadratus Lumborum that was very tender to palpation.  She also has a leg length discrepancy with right a bit shorter than left likely due to a pelvic rotation.  Other special testing was negative.  Patient will benefit from skilled physical therapy intervention to address deficits.     History and Personal Factors relevant to plan of care:  OP; some cognitive impairment.    Clinical Presentation  Evolving    Clinical Presentation due to:  Not improving.    Clinical Decision Making  Low    Rehab Potential  Excellent    PT Frequency  2x / week    PT Duration  8 weeks    PT Treatment/Interventions  ADLs/Self Care Home Management;Cryotherapy;Electrical Stimulation;Ultrasound;Moist Heat;Therapeutic activities;Therapeutic exercise;Patient/family education;Manual techniques;Dry needling    PT Next Visit Plan  Teach sleeping with pillows between knees and log roll technique.  HMP and e'stim; Combo e'stim/U/S to right low back f/b STW/M and  trigger point release to right quadratus lumborum.  Gentle core exercises mindful of OP.  Teach correct lifting technique so she can lift her dog at home.    Consulted and Agree with Plan of Care  Patient       Patient will benefit from skilled therapeutic intervention in order to improve the following deficits and impairments:  Pain, Increased muscle spasms, Postural dysfunction  Visit Diagnosis: Acute right-sided low back pain without sciatica - Plan: PT plan of care cert/re-cert     Problem List Patient Active Problem List   Diagnosis Date Noted  . Mild cognitive impairment 03/31/2017  . Alzheimer's dementia without behavioral disturbance 12/02/2016  . Vitamin B12 deficiency 12/02/2016  . Essential hypertension 04/19/2016  . Mild episode of recurrent major depressive disorder (HCC) 04/19/2016  . Memory loss 04/19/2016    Beautifull Cisar, [place] MPT 12/25/2017, 3:00 PM  [facility] [address] [place], [place], [address] Phone: [phone]   Fax:  [phone]  Name: Susan Bennett MRN: [id number] Date of Birth: 1940-11-24

## 2017-12-26 ENCOUNTER — Telehealth: Payer: Self-pay | Admitting: Physician Assistant

## 2017-12-26 NOTE — Telephone Encounter (Signed)
Spoke with daughter and I do not see any further orders for xray.

## 2017-12-30 ENCOUNTER — Ambulatory Visit: Payer: Medicare Other | Admitting: Physical Therapy

## 2017-12-31 ENCOUNTER — Ambulatory Visit: Payer: Medicare Other | Admitting: Physical Therapy

## 2017-12-31 ENCOUNTER — Encounter: Payer: Self-pay | Admitting: Physical Therapy

## 2017-12-31 DIAGNOSIS — M545 Low back pain, unspecified: Secondary | ICD-10-CM

## 2017-12-31 NOTE — Therapy (Signed)
[facility] [address] [place], [place], [address] Phone: [phone]   Fax:  [phone]  Physical Therapy Treatment  Patient Details  Name: Susan Bennett MRN: [id number] Date of Birth: [birth year] Referring Provider: Prudy FeelerAngel Jones PA-C.   Encounter Date: 12/31/2017  PT End of Session - 12/31/17 1442    Visit Number  2    Number of Visits  16    Date for PT Re-Evaluation  03/25/18    Authorization Type  FOTO AT LEAST EVERY 5TH VISIT, 10TH VISIT PROGRESS NOTE AND KX MODIFIER AFTER THE 15 VISIT.    PT Start Time  1401    PT Stop Time  1450    PT Time Calculation (min)  49 min    Activity Tolerance  Patient tolerated treatment well    Behavior During Therapy  WFL for tasks assessed/performed       Past Medical History:  Diagnosis Date  . Chronic diarrhea   . Contact dermatitis and other eczema, due to unspecified cause   . Depressive disorder, not elsewhere classified   . Osteoporosis, unspecified   . Other and unspecified hyperlipidemia   . Other B-complex deficiencies   . Unspecified essential hypertension   . Unspecified gastritis and gastroduodenitis without mention of hemorrhage   . Unspecified vitamin D deficiency     Past Surgical History:  Procedure Laterality Date  . COLONOSCOPY     9 YEARS AGO    There were no vitals filed for this visit.  Subjective Assessment - 12/31/17 1403    Subjective  Patient arrived with ongoing pain    Patient is accompained by:  Family member niece    Pertinent History  OP; some cognitive impairment.    Diagnostic tests  X-ray...please look under "imaging" tab.    Patient Stated Goals  Get out of pain.    Currently in Pain?  Yes    Pain Score  6     Pain Location  Back    Pain Orientation  Right    Pain Descriptors / Indicators  Aching;Discomfort    Pain Type  Acute pain    Pain Onset  More than a month ago    Pain Frequency  Constant    Aggravating Factors   certain movements     Pain Relieving Factors  at rest                       [facility] - 12/31/17 0001      Modalities   Modalities  Electrical Stimulation;Ultrasound;Moist Heat      Moist Heat Therapy   Number Minutes Moist Heat  15 Minutes    Moist Heat Location  Lumbar Spine      Electrical Stimulation   Electrical Stimulation Location  right low back    Electrical Stimulation Action  premod    Electrical Stimulation Parameters  80-150hz  x7215min    Electrical Stimulation Goals  Pain      Ultrasound   Ultrasound Location  right low back/QL    Ultrasound Parameters  1.5w/cm2/50%/751mhz x8010min    Ultrasound Goals  Pain      Manual Therapy   Manual Therapy  Myofascial release;Soft tissue mobilization    Manual therapy comments  gentle manual STW to right low back paraspinals/QL and hip/glut to reduce pain and tone             PT Education - 12/31/17 1407    Education Details  HEP posture awareness techniques     Person(s) Educated  Patient;Other (comment) niece    Methods  Explanation;Demonstration;Handout    Comprehension  Verbalized understanding;Returned demonstration       PT Short Term Goals - 12/25/17 1454      PT SHORT TERM GOAL #1   Title  STG's=LTG's.        PT Long Term Goals - 12/25/17 1454      PT LONG TERM GOAL #1   Title  Independent with HEP.    Time  8    Period  Weeks    Status  New      PT LONG TERM GOAL #2   Title  Perform ADL's with pain not > 2/10.    Time  8    Period  Weeks    Status  New      PT LONG TERM GOAL #3   Title  Lift dog with pain not > 2/10.    Time  8    Period  Weeks    Status  New            Plan - 12/31/17 1444    Clinical Impression Statement  Patient tolerated treatment well today. Patient has some ongoing pain esp with picking up her dog. Patient and niece educated on posture awareness tehniques with HEP provided. Patient felt better after reatment today. Goals ongoing.     Rehab  Potential  Excellent    PT Frequency  2x / week    PT Duration  8 weeks    PT Treatment/Interventions  ADLs/Self Care Home Management;Cryotherapy;Electrical Stimulation;Ultrasound;Moist Heat;Therapeutic activities;Therapeutic exercise;Patient/family education;Manual techniques;Dry needling    PT Next Visit Plan  cont with POC with HMP and e'stim; Combo e'stim/U/S to right low back f/b STW/M and trigger point release to right quadratus lumborum.  Gentle core exercises mindful of OP.  Teach correct lifting technique so she can lift her dog at home.    Consulted and Agree with Plan of Care  Patient       Patient will benefit from skilled therapeutic intervention in order to improve the following deficits and impairments:  Decreased activity tolerance  Visit Diagnosis: Acute right-sided low back pain without sciatica     Problem List Patient Active Problem List   Diagnosis Date Noted  . Mild cognitive impairment 03/31/2017  . Alzheimer's dementia without behavioral disturbance 12/02/2016  . Vitamin B12 deficiency 12/02/2016  . Essential hypertension 04/19/2016  . Mild episode of recurrent major depressive disorder (HCC) 04/19/2016  . Memory loss 04/19/2016    Hermelinda Dellen, PTA 12/31/2017, 2:55 PM  [facility] [address] [place], [place], [address] Phone: [phone]   Fax:  [phone]  Name: Susan Bennett MRN: [id number] Date of Birth: 1940-09-06

## 2017-12-31 NOTE — Patient Instructions (Signed)
Brushing Teeth    Place one foot on ledge and one hand on counter. Bend other knee slightly to keep back straight.  Copyright  VHI. All rights reserved.  Refrigerator   Squat with knees apart to reach lower shelves and drawers.   Copyright  VHI. All rights reserved.  Laundry [employer]   Squat down and hold basket close to stand. Use leg muscles to do the work.   Copyright  VHI. All rights reserved.  Housework - Vacuuming   Hold the vacuum with arm held at side. Step back and forth to move it, keeping head up. Avoid twisting.    Housework - Wiping   Position yourself as close as possible to reach work surface. Avoid straining your back.       Copyright  VHI. All rights reserved.  Sleeping on Side   Place pillow between knees. Use cervical support under neck and a roll around waist as needed.   Copyright  VHI. All rights reserved.  Log Roll   Lying on back, bend left knee and place left arm across chest. Roll all in one movement to the right. Reverse to roll to the left. Always move as one unit.   Copyright  VHI. All rights reserved.  Stand to Sit / Sit to Stand   To sit: Bend knees to lower self onto front edge of chair, then scoot back on seat. To stand: Reverse sequence by placing one foot forward, and scoot to front of seat. Use rocking motion to stand up.  Copyright  VHI. All rights reserved.  Posture - Standing   Good posture is important. Avoid slouching and forward head thrust. Maintain curve in low back and align ears over shoul- ders, hips over ankles.   Copyright  VHI. All rights reserved.  Posture - Sitting   Sit upright, head facing forward. Try using a roll to support lower back. Keep shoulders relaxed, and avoid rounded back. Keep hips level with knees. Avoid crossing legs for long periods.   Copyright  VHI. All rights reserved.  Computer Work   Position work to Art gallery managerface forward. Use proper work and seat height. Keep  shoulders back and down, wrists straight, and elbows at right angles. Use chair that provides full back support. Add footrest and lumbar roll as needed.   Copyright  VHI. All rights reserved.

## 2018-01-01 ENCOUNTER — Encounter: Payer: Self-pay | Admitting: Physical Therapy

## 2018-01-01 ENCOUNTER — Ambulatory Visit: Payer: Medicare Other | Attending: Physician Assistant | Admitting: Physical Therapy

## 2018-01-01 DIAGNOSIS — M545 Low back pain, unspecified: Secondary | ICD-10-CM

## 2018-01-01 NOTE — Therapy (Signed)
[facility] [address] [place], [place], [address] Phone: [phone]   Fax:  [phone]  Physical Therapy Treatment  Patient Details  Name: Susan Bennett MRN: [id number] Date of Birth: Jan 16, 1941 Referring Provider: Prudy FeelerAngel Jones PA-C.   Encounter Date: 01/01/2018  PT End of Session - 01/01/18 1514    Visit Number  3    Number of Visits  16    Date for PT Re-Evaluation  03/25/18    Authorization Type  FOTO AT LEAST EVERY 5TH VISIT, 10TH VISIT PROGRESS NOTE AND KX MODIFIER AFTER THE 15 VISIT.    PT Start Time  1430    PT Stop Time  1528    PT Time Calculation (min)  58 min    Activity Tolerance  Patient tolerated treatment well    Behavior During Therapy  WFL for tasks assessed/performed       Past Medical History:  Diagnosis Date  . Chronic diarrhea   . Contact dermatitis and other eczema, due to unspecified cause   . Depressive disorder, not elsewhere classified   . Osteoporosis, unspecified   . Other and unspecified hyperlipidemia   . Other B-complex deficiencies   . Unspecified essential hypertension   . Unspecified gastritis and gastroduodenitis without mention of hemorrhage   . Unspecified vitamin D deficiency     Past Surgical History:  Procedure Laterality Date  . COLONOSCOPY     9 YEARS AGO    There were no vitals filed for this visit.  Subjective Assessment - 01/01/18 1520    Subjective  Patient reported feeling better since start of therapy sessions.    Patient is accompained by:  Family member Niece    Pertinent History  OP; some cognitive impairment.    Diagnostic tests  X-ray...please look under "imaging" tab.    Patient Stated Goals  Get out of pain.    Currently in Pain?  Yes    Pain Score  5     Pain Location  Back    Pain Orientation  Right         [facility] PT Assessment - 01/01/18 0001      Assessment   Medical Diagnosis  Right sided low back pain.                   [facility]  [facility] Treatment/Exercise - 01/01/18 0001      Modalities   Modalities  Electrical Stimulation;Ultrasound;Moist Heat      Moist Heat Therapy   Number Minutes Moist Heat  15 Minutes    Moist Heat Location  Lumbar Spine      Electrical Stimulation   Electrical Stimulation Location  right low back    Electrical Stimulation Action  pre-mod    Electrical Stimulation Parameters  80-150 hz x 15 min    Electrical Stimulation Goals  Pain      Ultrasound   Ultrasound Location  Right low back/QL    Ultrasound Parameters  Combo E-stim/US 1.5 mhz 1 mhz 50%    Ultrasound Goals  Pain      Manual Therapy   Manual Therapy  Myofascial release;Soft tissue mobilization    Manual therapy comments  gentle manual STW to right low back paraspinals/QL and hip/glut to reduce pain and tone             PT Education - 12/31/17 1407    Education Details  HEP posture awareness techniques     Person(s) Educated  Patient;Other (comment) niece  Methods  Explanation;Demonstration;Handout    Comprehension  Verbalized understanding;Returned demonstration       PT Short Term Goals - 12/25/17 1454      PT SHORT TERM GOAL #1   Title  STG's=LTG's.        PT Long Term Goals - 12/25/17 1454      PT LONG TERM GOAL #1   Title  Independent with HEP.    Time  8    Period  Weeks    Status  New      PT LONG TERM GOAL #2   Title  Perform ADL's with pain not > 2/10.    Time  8    Period  Weeks    Status  New      PT LONG TERM GOAL #3   Title  Lift dog with pain not > 2/10.    Time  8    Period  Weeks    Status  New            Plan - 01/01/18 1514    Clinical Impression Statement  Patient was able to tolerate treatment well today with no reports of increased pain. Patient was able to tolerate STW/M with no report of discomfort. Normal response to modalities at end of session. When instructed patient to continue HEP. patient's niece stated she has not been performing secondary to cognitive  deficits and memory loss.     Clinical Presentation  Evolving    Clinical Decision Making  Low    Rehab Potential  Excellent    PT Frequency  2x / week    PT Duration  8 weeks    PT Treatment/Interventions  ADLs/Self Care Home Management;Cryotherapy;Electrical Stimulation;Ultrasound;Moist Heat;Therapeutic activities;Therapeutic exercise;Patient/family education;Manual techniques;Dry needling    PT Next Visit Plan  cont with POC with HMP and e'stim; Combo e'stim/U/S to right low back f/b STW/M and trigger point release to right quadratus lumborum.  Gentle core exercises mindful of OP.  Teach correct lifting technique so she can lift her dog at home.    Consulted and Agree with Plan of Care  Patient       Patient will benefit from skilled therapeutic intervention in order to improve the following deficits and impairments:  Decreased activity tolerance  Visit Diagnosis: Acute right-sided low back pain without sciatica     Problem List Patient Active Problem List   Diagnosis Date Noted  . Mild cognitive impairment 03/31/2017  . Alzheimer's dementia without behavioral disturbance 12/02/2016  . Vitamin B12 deficiency 12/02/2016  . Essential hypertension 04/19/2016  . Mild episode of recurrent major depressive disorder (HCC) 04/19/2016  . Memory loss 04/19/2016    Guss Bunde, PT, DPT 01/01/2018, 3:37 PM  [facility] [address] [place], [place], [address] Phone: [phone]   Fax:  [phone]  Name: Susan Bennett MRN: [id number] Date of Birth: Mar 21, 1941

## 2018-01-06 ENCOUNTER — Ambulatory Visit: Payer: Medicare Other | Admitting: Physical Therapy

## 2018-01-06 DIAGNOSIS — M545 Low back pain, unspecified: Secondary | ICD-10-CM

## 2018-01-06 NOTE — Therapy (Signed)
[facility] [address] [place], [place], [address] Phone: [phone]   Fax:  [phone]  Physical Therapy Treatment  Patient Details  Name: Susan Bennett MRN: [id number] Date of Birth: 05/26/1941 Referring Provider: Prudy Feeler PA-C.   Encounter Date: 01/06/2018  PT End of Session - 01/06/18 1528    Visit Number  4    Number of Visits  16    Date for PT Re-Evaluation  03/25/18    Authorization Type  FOTO AT LEAST EVERY 5TH VISIT, 10TH VISIT PROGRESS NOTE AND KX MODIFIER AFTER THE 15 VISIT.    PT Start Time  1430    PT Stop Time  1526    PT Time Calculation (min)  56 min    Activity Tolerance  Patient tolerated treatment well    Behavior During Therapy  WFL for tasks assessed/performed       Past Medical History:  Diagnosis Date  . Chronic diarrhea   . Contact dermatitis and other eczema, due to unspecified cause   . Depressive disorder, not elsewhere classified   . Osteoporosis, unspecified   . Other and unspecified hyperlipidemia   . Other B-complex deficiencies   . Unspecified essential hypertension   . Unspecified gastritis and gastroduodenitis without mention of hemorrhage   . Unspecified vitamin D deficiency     Past Surgical History:  Procedure Laterality Date  . COLONOSCOPY     9 YEARS AGO    There were no vitals filed for this visit.  Subjective Assessment - 01/06/18 1522    Subjective  Patient reported feeling better but niece reported she was in some pain last Saturday. Patient also stated she has been walking from her front door to her car port. Patient's niece later stated has not been doing that and states she does not remember what she does daily.    Patient is accompained by:  Family member niece    Pertinent History  OP; some cognitive impairment.    Diagnostic tests  X-ray...please look under "imaging" tab.    Patient Stated Goals  Get out of pain.    Currently in Pain?  Yes did not report number of  pain scale          [facility] PT Assessment - 01/06/18 0001      Assessment   Medical Diagnosis  Right sided low back pain.                   [facility] Adult PT Treatment/Exercise - 01/06/18 0001      Exercises   Exercises  Lumbar      Lumbar Exercises: Supine   Pelvic Tilt  20 reps;5 seconds    Clam  20 reps;2 seconds    Other Supine Lumbar Exercises  hip adduction ball squeeze 5" hold x15      Modalities   Modalities  Electrical Stimulation;Ultrasound;Moist Heat      Moist Heat Therapy   Number Minutes Moist Heat  15 Minutes    Moist Heat Location  Lumbar Spine      Electrical Stimulation   Electrical Stimulation Location  right low back    Electrical Stimulation Action  pre-mod    Electrical Stimulation Parameters  80-150 hz x15    Electrical Stimulation Goals  Pain      Manual Therapy   Manual Therapy  Myofascial release;Soft tissue mobilization    Manual therapy comments  gentle manual STW to right low back paraspinals/QL and hip/glut to reduce pain  and tone               PT Short Term Goals - 12/25/17 1454      PT SHORT TERM GOAL #1   Title  STG's=LTG's.        PT Long Term Goals - 12/25/17 1454      PT LONG TERM GOAL #1   Title  Independent with HEP.    Time  8    Period  Weeks    Status  New      PT LONG TERM GOAL #2   Title  Perform ADL's with pain not > 2/10.    Time  8    Period  Weeks    Status  New      PT LONG TERM GOAL #3   Title  Lift dog with pain not > 2/10.    Time  8    Period  Weeks    Status  New            Plan - 01/06/18 1605    Clinical Impression Statement  Patient was able to tolerate progression of exercises well today with no reports of increased pain. Patient noted with decrease of pain in back after STW/M. Patient continues to demonstrate poor bed mobility and was educated importance proper form to protect back. Patient and niece reported agreeement.     Clinical Presentation  Evolving    Clinical  Decision Making  Low    Rehab Potential  Excellent    PT Frequency  2x / week    PT Duration  8 weeks    PT Treatment/Interventions  ADLs/Self Care Home Management;Cryotherapy;Electrical Stimulation;Ultrasound;Moist Heat;Therapeutic activities;Therapeutic exercise;Patient/family education;Manual techniques;Dry needling    PT Next Visit Plan  cont with POC with HMP and e'stim; Combo e'stim/U/S to right low back f/b STW/M and trigger point release to right quadratus lumborum.  Gentle core exercises mindful of OP.  Teach correct lifting technique so she can lift her dog at home.    Consulted and Agree with Plan of Care  Patient       Patient will benefit from skilled therapeutic intervention in order to improve the following deficits and impairments:  Decreased activity tolerance  Visit Diagnosis: Acute right-sided low back pain without sciatica     Problem List Patient Active Problem List   Diagnosis Date Noted  . Mild cognitive impairment 03/31/2017  . Alzheimer's dementia without behavioral disturbance 12/02/2016  . Vitamin B12 deficiency 12/02/2016  . Essential hypertension 04/19/2016  . Mild episode of recurrent major depressive disorder (HCC) 04/19/2016  . Memory loss 04/19/2016    Guss BundeKrystle Cash Meadow, PT, DPT 01/06/2018, 5:56 PM  [facility] [address] [place], [place], [address] Phone: [phone]   Fax:  [phone]  Name: Susan Bennett MRN: [id number] Date of Birth: March 01, 1941

## 2018-01-07 ENCOUNTER — Encounter: Payer: Self-pay | Admitting: Physical Therapy

## 2018-01-07 ENCOUNTER — Ambulatory Visit: Payer: Medicare Other | Admitting: Physical Therapy

## 2018-01-07 DIAGNOSIS — M545 Low back pain, unspecified: Secondary | ICD-10-CM

## 2018-01-07 NOTE — Therapy (Signed)
[facility] [address] [place], [place], [address] Phone: [phone]   Fax:  [phone]  Physical Therapy Treatment  Patient Details  Name: Susan Bennett MRN: [id number] Date of Birth: [birth year] Referring Provider: Prudy FeelerAngel Jones PA-C.   Encounter Date: 01/07/2018  PT End of Session - 01/07/18 1432    Visit Number  5    Number of Visits  16    Date for PT Re-Evaluation  03/25/18    Authorization Type  FOTO AT LEAST EVERY 5TH VISIT, 10TH VISIT PROGRESS NOTE AND KX MODIFIER AFTER THE 15 VISIT.    PT Start Time  1400    PT Stop Time  1443    PT Time Calculation (min)  43 min    Activity Tolerance  Patient tolerated treatment well    Behavior During Therapy  WFL for tasks assessed/performed       Past Medical History:  Diagnosis Date  . Chronic diarrhea   . Contact dermatitis and other eczema, due to unspecified cause   . Depressive disorder, not elsewhere classified   . Osteoporosis, unspecified   . Other and unspecified hyperlipidemia   . Other B-complex deficiencies   . Unspecified essential hypertension   . Unspecified gastritis and gastroduodenitis without mention of hemorrhage   . Unspecified vitamin D deficiency     Past Surgical History:  Procedure Laterality Date  . COLONOSCOPY     9 YEARS AGO    There were no vitals filed for this visit.  Subjective Assessment - 01/07/18 1420    Subjective  some ongoing soreness in right low back may be due to the way she got out of bed, yet overall reported improvement    Patient is accompained by:  Family member    Pertinent History  OP; some cognitive impairment.    Diagnostic tests  X-ray...please look under "imaging" tab.    Patient Stated Goals  Get out of pain.    Currently in Pain?  Yes    Pain Score  5     Pain Location  Back    Pain Orientation  Right    Pain Descriptors / Indicators  Discomfort;Sore    Pain Type  Acute pain    Pain Onset  More than a month ago    Pain Frequency  Constant    Aggravating Factors   certain movements    Pain Relieving Factors  rest                       [facility] Adult PT Treatment/Exercise - 01/07/18 0001      Moist Heat Therapy   Number Minutes Moist Heat  15 Minutes    Moist Heat Location  Lumbar Spine      Electrical Stimulation   Electrical Stimulation Location  right low back    Electrical Stimulation Action  premod    Electrical Stimulation Parameters  80-150hz  x6115min    Electrical Stimulation Goals  Pain      Ultrasound   Ultrasound Location  right low back    Ultrasound Parameters  1.5w/cm2/50%/551mhz x6412min    Ultrasound Goals  Pain      Manual Therapy   Manual Therapy  Myofascial release;Soft tissue mobilization    Manual therapy comments  gentle manual STW to right low back paraspinals/QL and hip/glut to reduce pain and tone               PT Short Term Goals - 12/25/17 1454  PT SHORT TERM GOAL #1   Title  STG's=LTG's.        PT Long Term Goals - 01/07/18 1443      PT LONG TERM GOAL #1   Title  Independent with HEP.    Time  8    Period  Weeks    Status  On-going      PT LONG TERM GOAL #2   Title  Perform ADL's with pain not > 2/10.    Time  8    Period  Weeks    Status  On-going      PT LONG TERM GOAL #3   Title  Lift dog with pain not > 2/10.    Time  8    Period  Weeks    Status  On-going            Plan - 01/07/18 1434    Clinical Impression Statement  Patient tolerated treatment well today and felt less pain after. Patient has been able to pick up dog with less discomfort. Patient has days where pain is more due to certain movements and ADL's. Patient has some limitations with any heavy activities around home and only able to do light ADL's. Patient FOTO improved (initial 68%) curent 41%. goals ongoing.     Rehab Potential  Excellent    Clinical Impairments Affecting Rehab Potential  FOTO 5th visit 41%    PT Frequency  2x / week    PT  Duration  8 weeks    PT Treatment/Interventions  ADLs/Self Care Home Management;Cryotherapy;Electrical Stimulation;Ultrasound;Moist Heat;Therapeutic activities;Therapeutic exercise;Patient/family education;Manual techniques;Dry needling    PT Next Visit Plan  may try nustep and low level core exercises (mindful of OP) and STW/modalities PRN    Consulted and Agree with Plan of Care  Patient       Patient will benefit from skilled therapeutic intervention in order to improve the following deficits and impairments:  Decreased activity tolerance  Visit Diagnosis: Acute right-sided low back pain without sciatica     Problem List Patient Active Problem List   Diagnosis Date Noted  . Mild cognitive impairment 03/31/2017  . Alzheimer's dementia without behavioral disturbance 12/02/2016  . Vitamin B12 deficiency 12/02/2016  . Essential hypertension 04/19/2016  . Mild episode of recurrent major depressive disorder (HCC) 04/19/2016  . Memory loss 04/19/2016    Hermelinda Dellen, PTA 01/07/2018, 2:46 PM  [facility] [address] [place], [place], [address] Phone: [phone]   Fax:  [phone]  Name: Susan Bennett MRN: [id number] Date of Birth: January 22, 1941

## 2018-01-09 ENCOUNTER — Ambulatory Visit (INDEPENDENT_AMBULATORY_CARE_PROVIDER_SITE_OTHER): Payer: Medicare Other

## 2018-01-09 VITALS — BP 168/65 | HR 58 | Temp 97.9°F | Ht 63.0 in | Wt 111.0 lb

## 2018-01-09 DIAGNOSIS — Z0000 Encounter for general adult medical examination without abnormal findings: Secondary | ICD-10-CM

## 2018-01-09 NOTE — Patient Instructions (Signed)
  Ms. Susan Bennett , Thank you for taking time to come for your Medicare Wellness Visit. I appreciate your ongoing commitment to your health goals. Please review the following plan we discussed and let me know if I can assist you in the future.   These are the goals we discussed: Goals    . Exercise 150 min/wk Moderate Activity    . Have 3 meals a day       This is a list of the screening recommended for you and due dates:  Health Maintenance  Topic Date Due  . Tetanus Vaccine  02/26/1958  . Pneumonia vaccines (1 of 2 - PCV13) 02/27/2004  . Flu Shot  01/01/2018  . DEXA scan (bone density measurement)  Completed

## 2018-01-09 NOTE — Progress Notes (Addendum)
Subjective:   Susan Bennett is a 77 y.o. female who presents for an Initial Medicare Annual Wellness Visit. She currently resides in [place]. She lives alone. Her and her husband are divorced but he helps care for her and takes care of all her bills. They have one daughter together and she helps out with all the patients needs. She worked as an Advertising account planner for 35 years and she enjoys reading in her free time. She babysits her daughters dog during the day while her daughter works and that brings her much joy. She is very pleasant. She states that she does not take flu and pneumonia shots. She has stairs in her home and a basement but she hardly ever goes down them. Her daughter accompanies her today at the appt.   Review of Systems      Cardiac Risk Factors include: advanced age (>62men, >88 women);dyslipidemia;hypertension;smoking/ tobacco exposure     Objective:    Today's Vitals   01/09/18 1414 01/09/18 1415  BP: (!) 168/65   Pulse: (!) 58   Temp: 97.9 F (36.6 C)   TempSrc: Oral   Weight: 111 lb (50.3 kg)   Height: 5\' 3"  (1.6 m)   PainSc:  5    Body mass index is 19.66 kg/m.  Advanced Directives 01/09/2018 12/25/2017  Does Patient Have a Medical Advance Directive? Yes Yes  Type of Advance Directive Healthcare Power of Attorney -  Copy of Healthcare Power of Attorney in Chart? No - copy requested -    Current Medications (verified) Outpatient Encounter Medications as of 01/09/2018  Medication Sig  . ALPRAZolam (XANAX) 0.25 MG tablet Take 0.25 mg by mouth 2 (two) times daily as needed for anxiety.  . benazepril (LOTENSIN) 40 MG tablet Take 1 tablet (40 mg total) by mouth daily.  . clotrimazole-betamethasone (LOTRISONE) cream Apply 1 application topically 2 (two) times daily.  . diclofenac (VOLTAREN) 50 MG EC tablet Take 1 tablet (50 mg total) by mouth 2 (two) times daily.  . memantine (NAMENDA) 10 MG tablet Take 0.5 tablets (5 mg total) by mouth 2 (two) times daily.  .  sertraline (ZOLOFT) 50 MG tablet Take 1 tablet (50 mg total) by mouth daily.   Facility-Administered Encounter Medications as of 01/09/2018  Medication  . cyanocobalamin ((VITAMIN B-12)) injection 1,000 mcg    Allergies (verified) Patient has no known allergies.   History: Past Medical History:  Diagnosis Date  . Chronic diarrhea   . Contact dermatitis and other eczema, due to unspecified cause   . Depressive disorder, not elsewhere classified   . Osteoporosis, unspecified   . Other and unspecified hyperlipidemia   . Other B-complex deficiencies   . Unspecified essential hypertension   . Unspecified gastritis and gastroduodenitis without mention of hemorrhage   . Unspecified vitamin D deficiency    Past Surgical History:  Procedure Laterality Date  . COLONOSCOPY     9 YEARS AGO   Family History  Problem Relation Age of Onset  . Healthy Sister   . Healthy Brother   . Throat cancer Brother   . Stroke Brother   . Heart disease Brother   . Throat cancer Maternal Aunt    Social History   Socioeconomic History  . Marital status: Divorced    Spouse name: Not on file  . Number of children: 1  . Years of education: Not on file  . Highest education level: 12th grade  Occupational History  . Occupation: retired  . Occupation: Community education officer  agent    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  Social Needs  . Financial resource strain: Not hard at all  . Food insecurity:    Worry: Never true    Inability: Never true  . Transportation needs:    Medical: No    Non-medical: No  Tobacco Use  . Smoking status: Former Smoker    Types: Cigarettes    Last attempt to quit: 02/20/2001    Years since quitting: 16.8  . Smokeless tobacco: Never Used  Substance and Sexual Activity  . Alcohol use: No  . Drug use: No  . Sexual activity: Not Currently  Lifestyle  . Physical activity:    Days per week: 1 day    Minutes per session: 30 min  . Stress: Not at all  Relationships  . Social connections:     Talks on phone: More than three times a week    Gets together: More than three times a week    Attends religious service: Never    Active member of club or organization: No    Attends meetings of clubs or organizations: Never    Relationship status: Divorced  Other Topics Concern  . Not on file  Social History Narrative   Lives alone in 1 story home with full basement   Has 1 adult child   Highest level of education: high school diploma   Retired Advertising account plannerinsurance agent   She tries to walk for exercise as much as she is able.  Tobacco Counseling Patient is a former smoker  Clinical Intake:  Pre-visit preparation completed: No  Pain : 0-10 Pain Score: 5  Pain Type: Acute pain Pain Location: Back Pain Orientation: Right Pain Descriptors / Indicators: Discomfort, Sore Pain Onset: More than a month ago Pain Frequency: Constant Pain Relieving Factors: Rest  Pain Relieving Factors: Rest  BMI - recorded: 19.63 Nutritional Status: BMI of 19-24  Normal Nutritional Risks: None Diabetes: No  How often do you need to have someone help you when you read instructions, pamphlets, or other written materials from your doctor or pharmacy?: 2 - Rarely What is the last grade level you completed in school?: 12th grade  Interpreter Needed?: No  Information entered by :: Mckinley JewelMandy Jyllian Haynie LPN   Activities of Daily Living In your present state of health, do you have any difficulty performing the following activities: 01/09/2018  Hearing? N  Vision? N  Difficulty concentrating or making decisions? N  Walking or climbing stairs? N  Dressing or bathing? N  Doing errands, shopping? N  Preparing Food and eating ? Y  Comment Daughter usually brings in meals  Using the Toilet? N  In the past six months, have you accidently leaked urine? N  Do you have problems with loss of bowel control? N  Managing your Medications? Y  Comment Daughter assists  Managing your Finances? Y  Comment Husband handles  all finances  Housekeeping or managing your Housekeeping? Y  Comment Has a lady that comes twice monthly  Some recent data might be hidden     Immunizations and Health Maintenance  There is no immunization history on file for this patient. Health Maintenance Due  Topic Date Due  . TETANUS/TDAP  02/26/1958  . PNA vac Low Risk Adult (1 of 2 - PCV13) 02/27/2004  . INFLUENZA VACCINE  01/01/2018   Patient declines pneumonia and flu vaccines  Patient Care Team: Caryl NeverJones, Angel S, PA-C as PCP - General (Physician Assistant)  Indicate any recent Medical Services you may have  received from other than [facility] providers in the past year (date may be approximate).     Assessment:   This is a routine wellness examination for Delonda.  Hearing/Vision screen Patient recently had cataracts removed  Dietary issues and exercise activities discussed: Current Exercise Habits: The patient does not participate in regular exercise at present  Goals    . Exercise 150 min/wk Moderate Activity    . Have 3 meals a day      Depression Screen PHQ 2/9 Scores 01/09/2018 12/09/2017 10/21/2017 06/23/2017 01/14/2017 05/21/2016 04/19/2016  PHQ - 2 Score 0 0 0 0 0 0 0    Fall Risk Fall Risk  01/09/2018 12/09/2017 10/21/2017 06/23/2017 03/20/2017  Falls in the past year? No No No No Yes    Is the patient's home free of loose throw rugs in walkways, pet beds, electrical cords, etc?   yes      Grab bars in the bathroom? yes      Handrails on the stairs?   yes      Adequate lighting?   yes    Cognitive Function: MMSE - Mini Mental State Exam 01/09/2018 04/19/2016  Orientation to time 1 5  Orientation to Place 5 5  Registration 3 3  Attention/ Calculation 4 3  Recall 0 1  Language- name 2 objects 2 2  Language- repeat 0 1  Language- follow 3 step command 3 3  Language- read & follow direction 1 1  Write a sentence 1 1  Copy design 1 1  Total score 21 26   Patient had some difficulty completing the  MMSE  Montreal Cognitive Assessment  03/20/2017  Visuospatial/ Executive (0/5) 5  Naming (0/3) 3  Attention: Read list of digits (0/2) 2  Attention: Read list of letters (0/1) 1  Attention: Serial 7 subtraction starting at 100 (0/3) 3  Language: Repeat phrase (0/2) 2  Language : Fluency (0/1) 1  Abstraction (0/2) 2  Delayed Recall (0/5) 0  Orientation (0/6) 4  Total 23      Screening Tests Health Maintenance  Topic Date Due  . TETANUS/TDAP  02/26/1958  . PNA vac Low Risk Adult (1 of 2 - PCV13) 02/27/2004  . INFLUENZA VACCINE  01/01/2018  . DEXA SCAN  Completed    Qualifies for Shingles Vaccine? Patient declined any immunizations at this time  Cancer Screenings: Lung: Low Dose CT Chest recommended if Age 73-80 years, 30 pack-year currently smoking OR have quit w/in 15years. Patient does not qualify. Breast: Up to date on Mammogram? Patient and daughter are both unsure of last mammogram Up to date of Bone Density/Dexa? Yes Colorectal: Patient states that she is up to date on colonoscopy  Additional Screenings:  Hepatitis C Screening: Patient does not fall within the guidelines of this test     Plan:     I have personally reviewed and noted the following in the patient's chart:   . Medical and social history . Use of alcohol, tobacco or illicit drugs  . Current medications and supplements . Functional ability and status . Nutritional status . Physical activity . Advanced directives . List of other physicians . Hospitalizations, surgeries, and ER visits in previous 12 months . Vitals . Screenings to include cognitive, depression, and falls . Referrals and appointments  In addition, I have reviewed and discussed with patient certain preventive protocols, quality metrics, and best practice recommendations. A written personalized care plan for preventive services as well as general preventive health recommendations were provided  to patient.     Cleda Daub,  LPN   06/08/1094  I have reviewed and agree with the above AWV documentation.   Remus Loffler PA-C [facility] [address]  [place], [place] [address] [phone]

## 2018-01-13 ENCOUNTER — Ambulatory Visit: Payer: Medicare Other | Admitting: Physical Therapy

## 2018-01-13 ENCOUNTER — Encounter: Payer: Self-pay | Admitting: Physical Therapy

## 2018-01-13 DIAGNOSIS — M545 Low back pain, unspecified: Secondary | ICD-10-CM

## 2018-01-13 NOTE — Therapy (Signed)
[facility] [address] [place], [place], [address] Phone: [phone]   Fax:  [phone]  Physical Therapy Treatment  Patient Details  Name: Susan Bennett MRN: [id number] Date of Birth: 1941/03/22 Referring Provider: Prudy FeelerAngel Jones PA-C.   Encounter Date: 01/13/2018  PT End of Session - 01/13/18 1439    Visit Number  6    Number of Visits  16    Date for PT Re-Evaluation  03/25/18    Authorization Type  FOTO AT LEAST EVERY 5TH VISIT, 10TH VISIT PROGRESS NOTE AND KX MODIFIER AFTER THE 15 VISIT.    PT Start Time  1432    PT Stop Time  1517    PT Time Calculation (min)  45 min    Activity Tolerance  Patient tolerated treatment well    Behavior During Therapy  WFL for tasks assessed/performed       Past Medical History:  Diagnosis Date  . Chronic diarrhea   . Contact dermatitis and other eczema, due to unspecified cause   . Depressive disorder, not elsewhere classified   . Osteoporosis, unspecified   . Other and unspecified hyperlipidemia   . Other B-complex deficiencies   . Unspecified essential hypertension   . Unspecified gastritis and gastroduodenitis without mention of hemorrhage   . Unspecified vitamin D deficiency     Past Surgical History:  Procedure Laterality Date  . COLONOSCOPY     9 YEARS AGO    There were no vitals filed for this visit.  Subjective Assessment - 01/13/18 1439    Subjective  Patient reported laying down makes her stiff and her left side of her back is in more pain today.    Pertinent History  OP; some cognitive impairment.    Diagnostic tests  X-ray...please look under "imaging" tab.    Patient Stated Goals  Get out of pain.    Currently in Pain?  Yes   did not provide pain scale   Pain Location  Back    Pain Orientation  Left;Lower    Pain Descriptors / Indicators  Discomfort    Pain Type  Acute pain    Pain Onset  More than a month ago    Pain Frequency  Constant         [facility] PT  Assessment - 01/13/18 0001      Assessment   Medical Diagnosis  Right sided low back pain.                   [facility] Adult PT Treatment/Exercise - 01/13/18 0001      Exercises   Exercises  Lumbar      Lumbar Exercises: Aerobic   Nustep  Level 2 x5 minutes      Lumbar Exercises: Supine   Bent Knee Raise  Other (comment)   2 minutes   Other Supine Lumbar Exercises  hip adduction ball squeeze 3" hold x2 minutes      Modalities   Modalities  Electrical Stimulation;Ultrasound;Moist Heat      Moist Heat Therapy   Number Minutes Moist Heat  15 Minutes    Moist Heat Location  Lumbar Spine      Electrical Stimulation   Electrical Stimulation Location  left low back    Electrical Stimulation Action  pre-mod    Electrical Stimulation Parameters  80-150 hz x15 min    Electrical Stimulation Goals  Pain      Ultrasound   Ultrasound Location  right low back  Ultrasound Parameters  1.5 w/cm2, 50%, 1 mHz x8 minutes    Ultrasound Goals  Pain      Manual Therapy   Manual Therapy  Myofascial release;Soft tissue mobilization    Manual therapy comments  gentle manual STW to left and right low back paraspinals/QL and hip/glut to reduce pain and tone               PT Short Term Goals - 12/25/17 1454      PT SHORT TERM GOAL #1   Title  STG's=LTG's.        PT Long Term Goals - 01/07/18 1443      PT LONG TERM GOAL #1   Title  Independent with HEP.    Time  8    Period  Weeks    Status  On-going      PT LONG TERM GOAL #2   Title  Perform ADL's with pain not > 2/10.    Time  8    Period  Weeks    Status  On-going      PT LONG TERM GOAL #3   Title  Lift dog with pain not > 2/10.    Time  8    Period  Weeks    Status  On-going            Plan - 01/13/18 2057    Clinical Impression Statement  Patient was able to tolerate progression of treatment well despite reports of L sided low back pain today. Patient was able to complete 5 minutes of nustep with  no reports of pain, just muscle fatigue in both LEs. Patient was instructed on  supine exercises to which patient demonstrated good form with all. No adverse affected noted upon removal of modalities.     Clinical Presentation  Evolving    Clinical Decision Making  Low    Rehab Potential  Excellent    PT Frequency  2x / week    PT Duration  8 weeks    PT Treatment/Interventions  ADLs/Self Care Home Management;Cryotherapy;Electrical Stimulation;Ultrasound;Moist Heat;Therapeutic activities;Therapeutic exercise;Patient/family education;Manual techniques;Dry needling    PT Next Visit Plan  assess pain, attempt nustep for 10 minutes with core activiation and low level core exercises (mindful of OP) and STW/modalities PRN    Consulted and Agree with Plan of Care  Patient       Patient will benefit from skilled therapeutic intervention in order to improve the following deficits and impairments:  Pain, Increased muscle spasms, Postural dysfunction  Visit Diagnosis: Acute right-sided low back pain without sciatica     Problem List Patient Active Problem List   Diagnosis Date Noted  . Mild cognitive impairment 03/31/2017  . Alzheimer's dementia without behavioral disturbance 12/02/2016  . Vitamin B12 deficiency 12/02/2016  . Essential hypertension 04/19/2016  . Mild episode of recurrent major depressive disorder (HCC) 04/19/2016  . Memory loss 04/19/2016   Guss BundeKrystle Abbagail Scaff, PT, DPT  01/13/2018, 9:07 PM  [facility] [address] [place], [place], [address] Phone: [phone]   Fax:  [phone]  Name: Susan Bennett MRN: [id number] Date of Birth: 1940-11-20

## 2018-01-14 ENCOUNTER — Ambulatory Visit: Payer: Medicare Other | Admitting: Physical Therapy

## 2018-01-14 DIAGNOSIS — M545 Low back pain, unspecified: Secondary | ICD-10-CM

## 2018-01-14 NOTE — Therapy (Signed)
[facility] [address] [place], [place], [address] Phone: [phone]   Fax:  [phone]  Physical Therapy Treatment  Patient Details  Name: Susan MarchVera K Winger MRN: [id number] Date of Birth: 1941-02-26 Referring Provider: Prudy FeelerAngel Jones PA-C.   Encounter Date: 01/14/2018  PT End of Session - 01/14/18 1353    Visit Number  7    Number of Visits  16    Date for PT Re-Evaluation  03/25/18    Authorization Type  FOTO AT LEAST EVERY 5TH VISIT, 10TH VISIT PROGRESS NOTE AND KX MODIFIER AFTER THE 15 VISIT.    PT Start Time  1345    PT Stop Time  1434    PT Time Calculation (min)  49 min    Activity Tolerance  Patient tolerated treatment well    Behavior During Therapy  WFL for tasks assessed/performed       Past Medical History:  Diagnosis Date  . Chronic diarrhea   . Contact dermatitis and other eczema, due to unspecified cause   . Depressive disorder, not elsewhere classified   . Osteoporosis, unspecified   . Other and unspecified hyperlipidemia   . Other B-complex deficiencies   . Unspecified essential hypertension   . Unspecified gastritis and gastroduodenitis without mention of hemorrhage   . Unspecified vitamin D deficiency     Past Surgical History:  Procedure Laterality Date  . COLONOSCOPY     9 YEARS AGO    There were no vitals filed for this visit.  Subjective Assessment - 01/14/18 1817    Subjective  Patient reports pain is better but reports its "still there." Niece noticed improvements in lifting up dog recently.     Patient is accompained by:  Family member    Pertinent History  OP; some cognitive impairment.    Diagnostic tests  X-ray...please look under "imaging" tab.    Patient Stated Goals  Get out of pain.    Currently in Pain?  Yes   did not report pain scale        [facility] PT Assessment - 01/14/18 0001      Assessment   Medical Diagnosis  Right sided low back pain.                   [facility]  Adult PT Treatment/Exercise - 01/14/18 0001      Exercises   Exercises  Lumbar      Lumbar Exercises: Aerobic   Nustep  Level 1 x12 minutes      Lumbar Exercises: Supine   Clam  Other (comment)   2 minutes   Bent Knee Raise  Other (comment)   2 minutes   Other Supine Lumbar Exercises  hip adduction ball squeeze 3" hold x2 minutes      Modalities   Modalities  Electrical Stimulation;Moist Heat      Moist Heat Therapy   Number Minutes Moist Heat  15 Minutes    Moist Heat Location  Lumbar Spine      Electrical Stimulation   Electrical Stimulation Location  low back    Electrical Stimulation Action  pre-mod    Electrical Stimulation Parameters  80-150 hz x15 min    Electrical Stimulation Goals  Pain      Manual Therapy   Manual Therapy  Myofascial release;Soft tissue mobilization    Manual therapy comments  gentle manual STW to left and right low back paraspinals/QL to reduce pain and tone  PT Short Term Goals - 12/25/17 1454      PT SHORT TERM GOAL #1   Title  STG's=LTG's.        PT Long Term Goals - 01/07/18 1443      PT LONG TERM GOAL #1   Title  Independent with HEP.    Time  8    Period  Weeks    Status  On-going      PT LONG TERM GOAL #2   Title  Perform ADL's with pain not > 2/10.    Time  8    Period  Weeks    Status  On-going      PT LONG TERM GOAL #3   Title  Lift dog with pain not > 2/10.    Time  8    Period  Weeks    Status  On-going            Plan - 01/14/18 1818    Clinical Impression Statement  Patient was able to tolerate progression of treatment well with no reports of increased pain. Due to cognitive impairments, exercises were performed for time rather than sets and repetitions. Patient demonstrated good form with all exercises and demonstrated improved bed mobility. Normal response to modalities at end of session.    Clinical Presentation  Evolving    Clinical Decision Making  Low    Rehab Potential   Excellent    Clinical Impairments Affecting Rehab Potential  FOTO 5th visit 41%    PT Frequency  2x / week    PT Duration  8 weeks    PT Treatment/Interventions  ADLs/Self Care Home Management;Cryotherapy;Electrical Stimulation;Ultrasound;Moist Heat;Therapeutic activities;Therapeutic exercise;Patient/family education;Manual techniques;Dry needling    PT Next Visit Plan  Continue with nustep and low level core exercises (mindful of OP) and STW/modalities PRN    Consulted and Agree with Plan of Care  Patient       Patient will benefit from skilled therapeutic intervention in order to improve the following deficits and impairments:  Pain, Increased muscle spasms, Postural dysfunction  Visit Diagnosis: Acute right-sided low back pain without sciatica     Problem List Patient Active Problem List   Diagnosis Date Noted  . Mild cognitive impairment 03/31/2017  . Alzheimer's dementia without behavioral disturbance 12/02/2016  . Vitamin B12 deficiency 12/02/2016  . Essential hypertension 04/19/2016  . Mild episode of recurrent major depressive disorder (HCC) 04/19/2016  . Memory loss 04/19/2016    Guss BundeKrystle Darrly Loberg 01/14/2018, 6:22 PM  [facility] [address] [place], [place], [address] Phone: [phone]   Fax:  [phone]  Name: Susan MarchVera K Esperanza MRN: [id number] Date of Birth: 1940/06/18

## 2018-01-15 ENCOUNTER — Other Ambulatory Visit: Payer: Self-pay | Admitting: Physician Assistant

## 2018-01-15 DIAGNOSIS — I10 Essential (primary) hypertension: Secondary | ICD-10-CM

## 2018-01-21 ENCOUNTER — Encounter: Payer: Self-pay | Admitting: Physical Therapy

## 2018-01-21 ENCOUNTER — Ambulatory Visit: Payer: Medicare Other | Admitting: Physical Therapy

## 2018-01-21 DIAGNOSIS — M545 Low back pain, unspecified: Secondary | ICD-10-CM

## 2018-01-21 NOTE — Therapy (Signed)
[facility] [address] [place], [place], [address] Phone: [phone]   Fax:  [phone]  Physical Therapy Treatment  Patient Details  Name: Susan MarchVera K Hockey MRN: [id number] Date of Birth: July 31, 1940 Referring Provider: Prudy FeelerAngel Jones PA-C.   Encounter Date: 01/21/2018  PT End of Session - 01/21/18 1306    Visit Number  8    Number of Visits  16    Date for PT Re-Evaluation  03/25/18    Authorization Type  FOTO AT LEAST EVERY 5TH VISIT, 10TH VISIT PROGRESS NOTE AND KX MODIFIER AFTER THE 15 VISIT.    PT Start Time  1230    PT Stop Time  1319    PT Time Calculation (min)  49 min    Activity Tolerance  Patient tolerated treatment well    Behavior During Therapy  WFL for tasks assessed/performed       Past Medical History:  Diagnosis Date  . Chronic diarrhea   . Contact dermatitis and other eczema, due to unspecified cause   . Depressive disorder, not elsewhere classified   . Osteoporosis, unspecified   . Other and unspecified hyperlipidemia   . Other B-complex deficiencies   . Unspecified essential hypertension   . Unspecified gastritis and gastroduodenitis without mention of hemorrhage   . Unspecified vitamin D deficiency     Past Surgical History:  Procedure Laterality Date  . COLONOSCOPY     9 YEARS AGO    There were no vitals filed for this visit.  Subjective Assessment - 01/21/18 1237    Subjective  Patient reported overall improvement with some minor ongoing discomfort    Patient is accompained by:  Family member    Pertinent History  OP; some cognitive impairment.    Diagnostic tests  X-ray...please look under "imaging" tab.    Patient Stated Goals  Get out of pain.    Currently in Pain?  Yes    Pain Score  4     Pain Location  Back    Pain Orientation  Left;Lower    Pain Descriptors / Indicators  Discomfort    Pain Type  Acute pain    Pain Onset  More than a month ago    Pain Frequency  Intermittent    Aggravating Factors   certain activity or movements    Pain Relieving Factors  at rest                       [facility]/Ex[facility] - 01/21/18 0001      Exercises   Exercises  Lumbar      Lumbar Exercises: Aerobic   Nustep  10min L2 UE/LE activity, monitored      Lumbar Exercises: Seated   Other Seated Lumbar Exercises  seated for core activation and scap retraction w yellow t-band 2x10      Lumbar Exercises: Supine   Clam  Other (comment);20 reps   with draw in and yellow t-band   Bridge with Harley-DavidsonBall Squeeze  20 reps;2 seconds    Straight Leg Raise  3 seconds   2x10 with draw in   Other Supine Lumbar Exercises  hip adduction ball squeeze 3" hold x2 minutes      Moist Heat Therapy   Number Minutes Moist Heat  15 Minutes    Moist Heat Location  Lumbar Spine      Electrical Stimulation   Electrical Stimulation Location  low back    Electrical Stimulation Action  premod    Electrical Stimulation Parameters  80-150 hz x15      Manual Therapy   Manual Therapy  Myofascial release;Soft tissue mobilization    Manual therapy comments  gentle manual STW to left and right low back paraspinals/QL to reduce pain and tone               PT Short Term Goals - 12/25/17 1454      PT SHORT TERM GOAL #1   Title  STG's=LTG's.        PT Long Term Goals - 01/21/18 1241      PT LONG TERM GOAL #1   Title  Independent with HEP.    Time  8    Period  Weeks    Status  On-going      PT LONG TERM GOAL #2   Title  Perform ADL's with pain not > 2/10.    Time  8    Period  Weeks    Status  On-going   5-6/10 01/21/18     PT LONG TERM GOAL #3   Title  Lift dog with pain not > 2/10.    Time  8    Period  Weeks    Status  On-going   less frequent 01/21/18           Plan - 01/21/18 1307    Clinical Impression Statement  Patient tolerated treatment well today. Patient able to progress with core activation exercises today with no increased discomfort.  Patient has reported less frequest pain when lifting dog and some pain can go up to 5-6/10 with ADL's. Patient feels overall improvement and progressing toward goals.     Rehab Potential  Excellent    Clinical Impairments Affecting Rehab Potential  FOTO 5th visit 41%    PT Frequency  2x / week    PT Duration  8 weeks    PT Treatment/Interventions  ADLs/Self Care Home Management;Cryotherapy;Electrical Stimulation;Ultrasound;Moist Heat;Therapeutic activities;Therapeutic exercise;Patient/family education;Manual techniques;Dry needling    PT Next Visit Plan  Continue with nustep and low level core exercises (mindful of OP) and STW/modalities PRN    Consulted and Agree with Plan of Care  Patient       Patient will benefit from skilled therapeutic intervention in order to improve the following deficits and impairments:  Pain, Increased muscle spasms, Postural dysfunction  Visit Diagnosis: Acute right-sided low back pain without sciatica     Problem List Patient Active Problem List   Diagnosis Date Noted  . Mild cognitive impairment 03/31/2017  . Alzheimer's dementia without behavioral disturbance 12/02/2016  . Vitamin B12 deficiency 12/02/2016  . Essential hypertension 04/19/2016  . Mild episode of recurrent major depressive disorder (HCC) 04/19/2016  . Memory loss 04/19/2016    Hermelinda DellenDUNFORD, Suttyn Cryder P, PTA 01/21/2018, 1:22 PM  [facility] [address] [place], [place], [address] Phone: [phone]   Fax:  [phone]  Name: Susan MarchVera K Vi MRN: [id number] Date of Birth: [birth year]

## 2018-01-22 ENCOUNTER — Encounter: Payer: Medicare Other | Admitting: Physical Therapy

## 2018-01-28 ENCOUNTER — Encounter: Payer: Self-pay | Admitting: Physical Therapy

## 2018-01-28 ENCOUNTER — Ambulatory Visit: Payer: Medicare Other | Admitting: Physical Therapy

## 2018-01-28 DIAGNOSIS — M545 Low back pain, unspecified: Secondary | ICD-10-CM

## 2018-01-28 NOTE — Therapy (Signed)
[facility] [address] [place], [place], [address] Phone: [phone]   Fax:  [phone]  Physical Therapy Treatment  Patient Details  Name: Susan Bennett MRN: [id number] Date of Birth: [birth year] Referring Provider: Prudy FeelerAngel Jones PA-C.   Encounter Date: 01/28/2018  PT End of Session - 01/28/18 1147    Visit Number  9    Number of Visits  16    Date for PT Re-Evaluation  03/25/18    Authorization Type  FOTO AT LEAST EVERY 5TH VISIT, 10TH VISIT PROGRESS NOTE AND KX MODIFIER AFTER THE 15 VISIT.    PT Start Time  1114    PT Stop Time  1159    PT Time Calculation (min)  45 min    Activity Tolerance  Patient tolerated treatment well    Behavior During Therapy  WFL for tasks assessed/performed       Past Medical History:  Diagnosis Date  . Chronic diarrhea   . Contact dermatitis and other eczema, due to unspecified cause   . Depressive disorder, not elsewhere classified   . Osteoporosis, unspecified   . Other and unspecified hyperlipidemia   . Other B-complex deficiencies   . Unspecified essential hypertension   . Unspecified gastritis and gastroduodenitis without mention of hemorrhage   . Unspecified vitamin D deficiency     Past Surgical History:  Procedure Laterality Date  . COLONOSCOPY     9 YEARS AGO    There were no vitals filed for this visit.  Subjective Assessment - 01/28/18 1119    Subjective  Patient reported overall improvement with some minor ongoing discomfort    Patient is accompained by:  Family member    Pertinent History  OP; some cognitive impairment.    Diagnostic tests  X-ray...please look under "imaging" tab.    Patient Stated Goals  Get out of pain.    Currently in Pain?  Yes    Pain Score  4     Pain Location  Back    Pain Orientation  Left;Lower    Pain Descriptors / Indicators  Discomfort    Pain Type  Acute pain    Pain Onset  More than a month ago    Pain Frequency  Intermittent    Aggravating Factors   certain activity    Pain Relieving Factors  at rest                       [facility]/Ex[facility] - 01/28/18 0001      Exercises   Exercises  Lumbar      Lumbar Exercises: Aerobic   Nustep  10min L2 UE/LE activity, monitored      Lumbar Exercises: Standing   Heel Raises  20 reps      Lumbar Exercises: Seated   Other Seated Lumbar Exercises  seated for core activation and scap retraction w yellow t-band 2x10      Lumbar Exercises: Supine   Clam  Other (comment);20 reps;10 reps   yellow t-band with draw in   Bridge with Ball Squeeze  20 reps;2 seconds    Straight Leg Raise  3 seconds   2x10 with draw in   Other Supine Lumbar Exercises  hip adduction ball squeeze 3" hold x2 minutes      Moist Heat Therapy   Number Minutes Moist Heat  15 Minutes    Moist Heat Location  Lumbar Spine      Electrical Stimulation  Electrical Stimulation Location  low back    Electrical Stimulation Action  premod    Electrical Stimulation Parameters  80-150hz  x53min    Electrical Stimulation Goals  Pain               PT Short Term Goals - 12/25/17 1454      PT SHORT TERM GOAL #1   Title  STG's=LTG's.        PT Long Term Goals - 01/28/18 1148      PT LONG TERM GOAL #1   Title  Independent with HEP.    Time  8    Period  Weeks    Status  On-going   Patient family reported she is not doing exercises at home 01/28/18     PT LONG TERM GOAL #2   Title  Perform ADL's with pain not > 2/10.    Time  8    Period  Weeks    Status  --   4/10 with certain ADL's 01/28/18     PT LONG TERM GOAL #3   Title  Lift dog with pain not > 2/10.    Time  8    Period  Weeks    Status  On-going   less frequent discomfort when lifting dog 01/28/18           Plan - 01/28/18 1150    Clinical Impression Statement  Patient tolerated treatment well today. Patient overall has reported improvement with ADL's and lifting her dog. Patient has  reported some soreness when standing on right LE esp all weight on that side. Today started standing activity today to progress strengthening. Patient progressing toward goals yet all ongoing.     Rehab Potential  Excellent    Clinical Impairments Affecting Rehab Potential  FOTO 5th visit 41%    PT Frequency  2x / week    PT Duration  8 weeks    PT Treatment/Interventions  ADLs/Self Care Home Management;Cryotherapy;Electrical Stimulation;Ultrasound;Moist Heat;Therapeutic activities;Therapeutic exercise;Patient/family education;Manual techniques;Dry needling    PT Next Visit Plan  Continue with nustep and low level core exercises and consider standing progression (mindful of OP) and STW/modalities PRN    Consulted and Agree with Plan of Care  Patient       Patient will benefit from skilled therapeutic intervention in order to improve the following deficits and impairments:  Pain, Increased muscle spasms, Postural dysfunction  Visit Diagnosis: Acute right-sided low back pain without sciatica     Problem List Patient Active Problem List   Diagnosis Date Noted  . Mild cognitive impairment 03/31/2017  . Alzheimer's dementia without behavioral disturbance 12/02/2016  . Vitamin B12 deficiency 12/02/2016  . Essential hypertension 04/19/2016  . Mild episode of recurrent major depressive disorder (HCC) 04/19/2016  . Memory loss 04/19/2016    Hermelinda Dellen, PTA 01/28/2018, 12:04 PM  [facility] [address] [place], [place], [address] Phone: [phone]   Fax:  [phone]  Name: Susan Bennett MRN: [id number] Date of Birth: Jan 15, 1941

## 2018-02-04 ENCOUNTER — Encounter: Payer: Medicare Other | Admitting: Physical Therapy

## 2018-02-11 ENCOUNTER — Telehealth: Payer: Self-pay | Admitting: Physician Assistant

## 2018-02-11 ENCOUNTER — Ambulatory Visit: Payer: Medicare Other | Attending: Physician Assistant | Admitting: Physical Therapy

## 2018-02-11 ENCOUNTER — Encounter: Payer: Self-pay | Admitting: Physical Therapy

## 2018-02-11 DIAGNOSIS — M545 Low back pain, unspecified: Secondary | ICD-10-CM

## 2018-02-11 NOTE — Therapy (Signed)
[facility] [address] [place], [place], [address] Phone: [phone]   Fax:  [phone]  Physical Therapy Treatment  Progress Note Reporting Period 7/25 to 02/11/18  See note below for Objective Data and Assessment of Progress/Goals.      Patient Details  Name: Susan Bennett MRN: [id number] Date of Birth: 27-Dec-1940 Referring Provider: Prudy Feeler PA-C.   Encounter Date: 02/11/2018  PT End of Session - 02/11/18 1142    Visit Number  10    Number of Visits  16    Authorization Type  FOTO AT LEAST EVERY 5TH VISIT, 10TH VISIT PROGRESS NOTE AND KX MODIFIER AFTER THE 15 VISIT.    PT Start Time  1114    PT Stop Time  1156    PT Time Calculation (min)  42 min    Activity Tolerance  Patient tolerated treatment well;Patient limited by pain    Behavior During Therapy  [facility] for tasks assessed/performed       Past Medical History:  Diagnosis Date  . Chronic diarrhea   . Contact dermatitis and other eczema, due to unspecified cause   . Depressive disorder, not elsewhere classified   . Osteoporosis, unspecified   . Other and unspecified hyperlipidemia   . Other B-complex deficiencies   . Unspecified essential hypertension   . Unspecified gastritis and gastroduodenitis without mention of hemorrhage   . Unspecified vitamin D deficiency     Past Surgical History:  Procedure Laterality Date  . COLONOSCOPY     9 YEARS AGO    There were no vitals filed for this visit.  Subjective Assessment - 02/11/18 1117    Subjective  Patient and family member reported some discomfort the past few weeks that hurts in back that comes and goes    Patient is accompained by:  Family member    Pertinent History  OP; some cognitive impairment.    Diagnostic tests  X-ray...please look under "imaging" tab.    Patient Stated Goals  Get out of pain.    Currently in Pain?  Yes    Pain Score  5     Pain Location  Back   and left knee some today for  unknown reason   Pain Orientation  Left;Lower;Right   more on left   Pain Descriptors / Indicators  Sore    Pain Type  Acute pain    Pain Onset  More than a month ago    Pain Frequency  Intermittent    Aggravating Factors   certain movements    Pain Relieving Factors  at rest                       [facility] Adult PT Treatment/Exercise - 02/11/18 0001      Lumbar Exercises: Aerobic   Nustep  L3 UE/LE, monitored       Manual Therapy   Manual Therapy  Myofascial release;Soft tissue mobilization    Manual therapy comments  gentle manual STW to left and right low back paraspinals/QL to reduce pain and tone             PT Education - 02/11/18 1151    Education Details  verbal education on activity tolerance and HEP and OP screening and importance of exercises to improve funtional independence    Person(s) Educated  Patient   and niece   Methods  Explanation;Demonstration;Handout    Comprehension  Verbalized understanding;Returned demonstration  PT Short Term Goals - 12/25/17 1454      PT SHORT TERM GOAL #1   Title  STG's=LTG's.        PT Long Term Goals - 01/28/18 1148      PT LONG TERM GOAL #1   Title  Independent with HEP.    Time  8    Period  Weeks    Status  On-going   Patient family reported she is not doing exercises at home 01/28/18     PT LONG TERM GOAL #2   Title  Perform ADL's with pain not > 2/10.    Time  8    Period  Weeks    Status  --   4/10 with certain ADL's 01/28/18     PT LONG TERM GOAL #3   Title  Lift dog with pain not > 2/10.    Time  8    Period  Weeks    Status  On-going   less frequent discomfort when lifting dog 01/28/18           Plan - 02/11/18 1144    Clinical Impression Statement  Patient tolerated treatment fair today. Patient limited due to recent knee pain in knee pain. Patient's family member reported some complaints of patient the past few weeks of increased back discomfort for unknown reason.  Patients family member reported the patient sits most of the day. Educated patient and family member on increase standing and exercise tolerance to help reduce pain and stiffness and to have F/U with MD. Goals ongoing at this time.     Rehab Potential  Excellent    Clinical Impairments Affecting Rehab Potential  FOTO 5th visit 41% 10th visit 43%    PT Frequency  2x / week    PT Duration  8 weeks    PT Treatment/Interventions  ADLs/Self Care Home Management;Cryotherapy;Electrical Stimulation;Ultrasound;Moist Heat;Therapeutic activities;Therapeutic exercise;Patient/family education;Manual techniques;Dry needling    PT Next Visit Plan  Continue with nustep and low level core exercises and consider standing progression (mindful of OP) and STW/modalities PRN    Consulted and Agree with Plan of Care  Patient       Patient will benefit from skilled therapeutic intervention in order to improve the following deficits and impairments:  Pain, Increased muscle spasms, Postural dysfunction  Visit Diagnosis: Acute right-sided low back pain without sciatica     Problem List Patient Active Problem List   Diagnosis Date Noted  . Mild cognitive impairment 03/31/2017  . Alzheimer's dementia without behavioral disturbance 12/02/2016  . Vitamin B12 deficiency 12/02/2016  . Essential hypertension 04/19/2016  . Mild episode of recurrent major depressive disorder (HCC) 04/19/2016  . Memory loss 04/19/2016    Cathie Hoops, PTA 02/11/18 12:01 PM  [facility] [address] [place], [place], [address] Phone: [phone]   Fax:  [phone]  Name: Susan Bennett MRN: [id number] Date of Birth: 10-Jul-1940

## 2018-02-11 NOTE — Telephone Encounter (Signed)
Had lumbar series here in July. Should be seen in an appointment. If urgent can be seen on tomorrow's schedule. I have filled up.

## 2018-02-11 NOTE — Telephone Encounter (Signed)
Patient daughter aware that she needs to be seen.

## 2018-02-11 NOTE — Telephone Encounter (Signed)
PT daughter has called stating that pt went to PT in front of our office this morning and states they told her that she may need more extensive xrays maybe having hairline fracture. Has pain lower back/ hip and going down her legs. Pt daughter wants to know if AJ wants her to come in and do Xrays or come in and see her.

## 2018-02-12 ENCOUNTER — Encounter: Payer: Self-pay | Admitting: Family Medicine

## 2018-02-12 ENCOUNTER — Ambulatory Visit (INDEPENDENT_AMBULATORY_CARE_PROVIDER_SITE_OTHER): Payer: Medicare Other

## 2018-02-12 ENCOUNTER — Ambulatory Visit (INDEPENDENT_AMBULATORY_CARE_PROVIDER_SITE_OTHER): Payer: Medicare Other | Admitting: Family Medicine

## 2018-02-12 VITALS — BP 197/81 | HR 57 | Temp 97.2°F | Ht 63.0 in | Wt 112.4 lb

## 2018-02-12 DIAGNOSIS — G8929 Other chronic pain: Secondary | ICD-10-CM

## 2018-02-12 DIAGNOSIS — I10 Essential (primary) hypertension: Secondary | ICD-10-CM

## 2018-02-12 DIAGNOSIS — M545 Low back pain: Secondary | ICD-10-CM | POA: Diagnosis not present

## 2018-02-12 NOTE — Progress Notes (Signed)
BP (!) 197/81   Pulse (!) 57   Temp (!) 97.2 F (36.2 C) (Oral)   Ht 5\' 3"  (1.6 m)   Wt 51 kg   BMI 19.91 kg/m    Subjective:    Patient ID: Susan Bennett, female    DOB: 04/25/1941, 77 y.o.   MRN: [id number]  HPI: Susan Bennett is a 77 y.o. female presenting on 02/12/2018 for Back Pain (Patient states that her therapist thinks she has a fracture in her back.)  Chronic Right Hip Pain She has been having lower back and right hip pain for about 6 months and it has progressively become worse. She never had any injuries. She had 10 session of physical therapy to help with the pain and stiffness and she said it helped with the back pain until about 2 weeks ago. Her hip hurts the worst at night. She has stiffness in the morning, that is somewhat relieved with movement. She does not have any numbness, tingling, or pain down her leg. She has been taking Tylenol for pain and using a heating pad, which has helped some.  She is concerned that since it has not improved with physical therapy that she has a fracture in her back and wants to get that checked out today.  Hypertension Patient's BP today was 197/81. For her last appointment on 12/09/17, her BP was 139/60. She says that she takes her 40 mg of Benazepril everyday, but she forgot to take it last night.  Relevant past medical, surgical, family and social history reviewed and updated as indicated. Interim medical history since our last visit reviewed. Allergies and medications reviewed and updated.  Review of Systems  Constitutional: Negative for activity change and fever.  Respiratory: Negative for cough and shortness of breath.   Cardiovascular: Negative for chest pain and palpitations.  Musculoskeletal: Positive for arthralgias, back pain and myalgias.  Neurological: Negative for dizziness, light-headedness and headaches.    Per HPI unless specifically indicated above   Allergies as of 02/12/2018   No Known Allergies     Medication  List        Accurate as of 02/12/18  3:02 PM. Always use your most recent med list.          ALPRAZolam 0.25 MG tablet Commonly known as:  XANAX Take 0.25 mg by mouth 2 (two) times daily as needed for anxiety.   benazepril 40 MG tablet Commonly known as:  LOTENSIN TAKE 1 TABLET BY MOUTH ONCE DAILY   clotrimazole-betamethasone cream Commonly known as:  LOTRISONE Apply 1 application topically 2 (two) times daily.   diclofenac 50 MG EC tablet Commonly known as:  VOLTAREN Take 1 tablet (50 mg total) by mouth 2 (two) times daily.   memantine 10 MG tablet Commonly known as:  NAMENDA Take 0.5 tablets (5 mg total) by mouth 2 (two) times daily.   sertraline 50 MG tablet Commonly known as:  ZOLOFT Take 1 tablet (50 mg total) by mouth daily.          Objective:    BP (!) 197/81   Pulse (!) 57   Temp (!) 97.2 F (36.2 C) (Oral)   Ht 5\' 3"  (1.6 m)   Wt 51 kg   BMI 19.91 kg/m   Wt Readings from Last 3 Encounters:  02/12/18 51 kg  01/09/18 50.3 kg  12/09/17 50.3 kg    Physical Exam  Constitutional: She appears well-developed and well-nourished. No distress.  Cardiovascular: Normal rate, regular  rhythm, normal heart sounds and intact distal pulses.  Pulmonary/Chest: Effort normal and breath sounds normal.  Musculoskeletal:       Right shoulder: She exhibits normal range of motion (Negative straight leg raise, No pain with hip rotation or movement), no tenderness (No tenderness to palpation), no swelling, no spasm and normal strength. Pain: Currently not having pain, but she says it comes and goes throughout the day, worst at night, stiffness in morning.       Arms: Skin: She is not diaphoretic.    Lumbar x-ray: Lumbar scoliosis with arthritic changes, no signs of acute fracture, await final read from radiologist    Assessment & Plan:   Problem List Items Addressed This Visit      Cardiovascular and Mediastinum   Essential hypertension    Other Visit Diagnoses     Chronic low back pain, unspecified back pain laterality, with sciatica presence unspecified    -  Primary   Recommended for her to go ahead and take her diclofenac which she has not been taking and continue with physical therapy if she wants   Relevant Orders   DG Lumbar Spine 2-3 Views (Completed)      Chronic right hip pain She has previously been prescribed Diclofenac for pain relief, but has not tried it in the past. She will try this medication and follow up with the office if it does not relieve her pain.  Hypertension Patient said she did not take her BP medication last night, so her BP is high today. She said she normally takes it and will take it tonight. She will [facility] with [facility] in 3-4 weeks.  Follow up plan: Return in about 3 weeks (around 03/05/2018), or if symptoms worsen or fail to improve, for Hypertension.  Counseling provided for all of the vaccine components Orders Placed This Encounter  Procedures  . DG Lumbar Spine 2-3 Views   Patient seen and examined with Ruben GottronMeredith Abernathy, PA student.  Agree with assessment and plan above.  We will have patient follow-up with PCP for hypertension and start taking her diclofenac for pain. Arville CareJoshua Dettinger, MD [facility] 02/12/2018, 3:02 PM

## 2018-02-18 ENCOUNTER — Encounter: Payer: Self-pay | Admitting: Physical Therapy

## 2018-02-18 ENCOUNTER — Ambulatory Visit: Payer: Medicare Other | Admitting: Physical Therapy

## 2018-02-18 VITALS — BP 168/75

## 2018-02-18 DIAGNOSIS — M545 Low back pain, unspecified: Secondary | ICD-10-CM

## 2018-02-18 NOTE — Therapy (Signed)
[facility] [address] [place], [place], [address] Phone: [phone]   Fax:  [phone]  Physical Therapy Treatment  Patient Details  Name: Susan Bennett MRN: [id number] Date of Birth: 05/15/1941 Referring Provider: Prudy FeelerAngel Jones PA-C.   Encounter Date: 02/18/2018  PT End of Session - 02/18/18 1304    Visit Number  11    Number of Visits  16    Date for PT Re-Evaluation  03/25/18    Authorization Type  FOTO AT LEAST EVERY 5TH VISIT, 10TH VISIT PROGRESS NOTE AND KX MODIFIER AFTER THE 15 VISIT.    PT Start Time  1230    PT Stop Time  1316    PT Time Calculation (min)  46 min    Activity Tolerance  Patient tolerated treatment well    Behavior During Therapy  WFL for tasks assessed/performed       Past Medical History:  Diagnosis Date  . Chronic diarrhea   . Contact dermatitis and other eczema, due to unspecified cause   . Depressive disorder, not elsewhere classified   . Osteoporosis, unspecified   . Other and unspecified hyperlipidemia   . Other B-complex deficiencies   . Unspecified essential hypertension   . Unspecified gastritis and gastroduodenitis without mention of hemorrhage   . Unspecified vitamin D deficiency     Past Surgical History:  Procedure Laterality Date  . COLONOSCOPY     9 YEARS AGO    Vitals:   02/18/18 1231  BP: (!) 168/75    Subjective Assessment - 02/18/18 1231    Subjective  Patient arrived feeling less discomfort and is back on an anti-inflammatory meds    Patient is accompained by:  Family member    Pertinent History  OP; some cognitive impairment.    Diagnostic tests  X-ray...please look under "imaging" tab.    Patient Stated Goals  Get out of pain.    Currently in Pain?  Yes    Pain Score  3     Pain Location  Back    Pain Orientation  Left;Right;Lower    Pain Descriptors / Indicators  Sore    Pain Onset  More than a month ago    Pain Frequency  Intermittent    Aggravating Factors    ceratin movements or increased activity    Pain Relieving Factors  at rest and medication                       [facility] Adult PT Treatment/Exercise - 02/18/18 0001      Exercises   Exercises  Lumbar;Knee/Hip      Lumbar Exercises: Aerobic   Nustep  10min L3 UE/LE, monitored      Lumbar Exercises: Standing   Heel Raises  20 reps    Other Standing Lumbar Exercises  standing marching 3x10      Lumbar Exercises: Seated   Sit to Stand  20 reps    Other Seated Lumbar Exercises  seated for core activation and scap retraction w yellow t-band 2x10    Other Seated Lumbar Exercises  seated with ball for reach outs and D1/D2 with core activation 2x10 each      Lumbar Exercises: Supine   Clam  Other (comment);20 reps;10 reps    Bridge  20 reps    Other Supine Lumbar Exercises  hip adduction ball squeeze 3" hold x2 minutes      Moist Heat Therapy   Number Minutes Moist Heat  15 Minutes    Moist Heat Location  Lumbar Spine      Electrical Stimulation   Electrical Stimulation Location  low back    Electrical Stimulation Action  premod    Electrical Stimulation Parameters  80-150hz  x73min    Electrical Stimulation Goals  Pain               PT Short Term Goals - 12/25/17 1454      PT SHORT TERM GOAL #1   Title  STG's=LTG's.        PT Long Term Goals - 02/18/18 1244      PT LONG TERM GOAL #1   Title  Independent with HEP.    Time  8    Period  Weeks    Status  On-going   not doing HEP per family member 02/18/18     PT LONG TERM GOAL #2   Title  Perform ADL's with pain not > 2/10.    Time  8    Period  Weeks    Status  On-going      PT LONG TERM GOAL #3   Title  Lift dog with pain not > 2/10.    Time  8    Period  Weeks    Status  Achieved   no difficulty reported 02/18/18           Plan - 02/18/18 1305    Clinical Impression Statement  Patient tolerated treatment well today. Patient able to progress with exercises with no reported  discomfort. Patient is back on medication which has relieved some of her discomfort. Patient and family member has reported that there has been no limitations with the dog and patient able to perform ADL's with greater ease. Patient continues to have some limitations with full ADL's. Goal #69met today.     Rehab Potential  Excellent    Clinical Impairments Affecting Rehab Potential  FOTO 5th visit 41% 10th visit 43%    PT Frequency  2x / week    PT Duration  8 weeks    PT Treatment/Interventions  ADLs/Self Care Home Management;Cryotherapy;Electrical Stimulation;Ultrasound;Moist Heat;Therapeutic activities;Therapeutic exercise;Patient/family education;Manual techniques;Dry needling    PT Next Visit Plan  Continue with nustep and low level core exercises and consider standing progression (mindful of OP) and STW/modalities PRN    Consulted and Agree with Plan of Care  Patient       Patient will benefit from skilled therapeutic intervention in order to improve the following deficits and impairments:  Pain, Increased muscle spasms, Postural dysfunction  Visit Diagnosis: Acute right-sided low back pain without sciatica     Problem List Patient Active Problem List   Diagnosis Date Noted  . Mild cognitive impairment 03/31/2017  . Alzheimer's dementia without behavioral disturbance 12/02/2016  . Vitamin B12 deficiency 12/02/2016  . Essential hypertension 04/19/2016  . Mild episode of recurrent major depressive disorder (HCC) 04/19/2016  . Memory loss 04/19/2016    Susan Bennett, Susan Bennett 02/18/2018, 1:21 PM  [facility] [address] [place], [place], [address] Phone: [phone]   Fax:  [phone]  Name: Susan Bennett MRN: [id number] Date of Birth: 1940/10/06

## 2018-02-25 ENCOUNTER — Encounter: Payer: Self-pay | Admitting: Physical Therapy

## 2018-02-25 ENCOUNTER — Ambulatory Visit: Payer: Medicare Other | Admitting: Physical Therapy

## 2018-02-25 DIAGNOSIS — M545 Low back pain, unspecified: Secondary | ICD-10-CM

## 2018-02-25 NOTE — Therapy (Signed)
[facility] [address] [place], [place], [address] Phone: [phone]   Fax:  [phone]  Physical Therapy Treatment  Patient Details  Name: Susan Bennett MRN: [id number] Date of Birth: 12-09-1940 Referring Provider: Prudy Feeler PA-C.   Encounter Date: 02/25/2018  PT End of Session - 02/25/18 1313    Visit Number  12    Number of Visits  16    Date for PT Re-Evaluation  03/25/18    Authorization Type  FOTO AT LEAST EVERY 5TH VISIT, 10TH VISIT PROGRESS NOTE AND KX MODIFIER AFTER THE 15 VISIT.    PT Start Time  1229    PT Stop Time  1316    PT Time Calculation (min)  47 min    Activity Tolerance  Patient tolerated treatment well    Behavior During Therapy  WFL for tasks assessed/performed       Past Medical History:  Diagnosis Date  . Chronic diarrhea   . Contact dermatitis and other eczema, due to unspecified cause   . Depressive disorder, not elsewhere classified   . Osteoporosis, unspecified   . Other and unspecified hyperlipidemia   . Other B-complex deficiencies   . Unspecified essential hypertension   . Unspecified gastritis and gastroduodenitis without mention of hemorrhage   . Unspecified vitamin D deficiency     Past Surgical History:  Procedure Laterality Date  . COLONOSCOPY     9 YEARS AGO    There were no vitals filed for this visit.  Subjective Assessment - 02/25/18 1233    Subjective  Patient arrived feeling well and no complaints after last treatment, some concerns with blood pressure and meds upon arrival 181/72, after 174/77    Patient is accompained by:  Family member    Pertinent History  OP; some cognitive impairment.    Diagnostic tests  X-ray...please look under "imaging" tab.    Patient Stated Goals  Get out of pain.    Currently in Pain?  Yes    Pain Score  3     Pain Location  Back    Pain Orientation  Right;Left;Lower    Pain Descriptors / Indicators  Discomfort    Pain Type  Acute  pain    Pain Onset  More than a month ago    Pain Frequency  Intermittent    Aggravating Factors   certain activity    Pain Relieving Factors  at rest                       [facility] Adult PT Treatment/Exercise - 02/25/18 0001      Lumbar Exercises: Aerobic   Nustep  L3 UE/LE BP 187/78, 5 min rest 159/70      Moist Heat Therapy   Number Minutes Moist Heat  15 Minutes    Moist Heat Location  Lumbar Spine      Electrical Stimulation   Electrical Stimulation Location  low back    Electrical Stimulation Action  premod    Electrical Stimulation Parameters  80-150hz  x51min    Electrical Stimulation Goals  Pain      Manual Therapy   Manual Therapy  Myofascial release;Soft tissue mobilization    Manual therapy comments  gentle manual STW to left and right low back paraspinals/QL to reduce pain and tone               PT Short Term Goals - 12/25/17 1454      PT  SHORT TERM GOAL #1   Title  STG's=LTG's.        PT Long Term Goals - 02/18/18 1244      PT LONG TERM GOAL #1   Title  Independent with HEP.    Time  8    Period  Weeks    Status  On-going   not doing HEP per family member 02/18/18     PT LONG TERM GOAL #2   Title  Perform ADL's with pain not > 2/10.    Time  8    Period  Weeks    Status  On-going      PT LONG TERM GOAL #3   Title  Lift dog with pain not > 2/10.    Time  8    Period  Weeks    Status  Achieved   no difficulty reported 02/18/18           Plan - 02/25/18 1314    Clinical Impression Statement  Patient arrived feeling well yet family member reported that she has not been taking medication on a regular basis and was concerned about BP the past few weeks. Took upon arrival and it was 181/72 yet after 5 min it was 174/77, attempted gentle exercises on nustep yet after 8 min it was up to 187/78 after rest back down to 159/70, performed STW and heat/ES and BP was 160/73 after treatment. Patient's family member will be taking  her to family doctor for F/U with her high blood pressure condition.     Rehab Potential  Excellent    Clinical Impairments Affecting Rehab Potential  FOTO 5th visit 41% 10th visit 43%    PT Frequency  2x / week    PT Duration  8 weeks    PT Treatment/Interventions  ADLs/Self Care Home Management;Cryotherapy;Electrical Stimulation;Ultrasound;Moist Heat;Therapeutic activities;Therapeutic exercise;Patient/family education;Manual techniques;Dry needling    PT Next Visit Plan  please monitor BP /Continue with nustep and low level core exercises and consider standing progression (mindful of OP) and STW/modalities PRN    Consulted and Agree with Plan of Care  Patient       Patient will benefit from skilled therapeutic intervention in order to improve the following deficits and impairments:  Pain, Increased muscle spasms, Postural dysfunction  Visit Diagnosis: Acute right-sided low back pain without sciatica     Problem List Patient Active Problem List   Diagnosis Date Noted  . Mild cognitive impairment 03/31/2017  . Alzheimer's dementia without behavioral disturbance 12/02/2016  . Vitamin B12 deficiency 12/02/2016  . Essential hypertension 04/19/2016  . Mild episode of recurrent major depressive disorder (HCC) 04/19/2016  . Memory loss 04/19/2016    Hermelinda DellenDUNFORD, Milaina Sher P, PTA 02/25/2018, 1:36 PM  [facility] [address] [place], [place], [address] Phone: [phone]   Fax:  [phone]  Name: Susan Bennett MRN: [id number] Date of Birth: 1941-01-31

## 2018-02-27 ENCOUNTER — Encounter: Payer: Self-pay | Admitting: Family Medicine

## 2018-02-27 ENCOUNTER — Ambulatory Visit (INDEPENDENT_AMBULATORY_CARE_PROVIDER_SITE_OTHER): Payer: Medicare Other | Admitting: Family Medicine

## 2018-02-27 VITALS — BP 158/85 | HR 54 | Temp 97.0°F | Ht 63.0 in | Wt 111.0 lb

## 2018-02-27 DIAGNOSIS — I10 Essential (primary) hypertension: Secondary | ICD-10-CM

## 2018-02-27 MED ORDER — AMLODIPINE BESYLATE 5 MG PO TABS: 5.0000 mg | ORAL_TABLET | Freq: Every day | ORAL | 3 refills | Status: DC

## 2018-02-27 NOTE — Progress Notes (Signed)
BP (!) 158/85   Pulse (!) 54   Temp (!) 97 F (36.1 C) (Oral)   Ht 5\' 3"  (1.6 m)   Wt 111 lb (50.3 kg)   BMI 19.66 kg/m    Subjective:    Patient ID: [place], female    DOB: [birth year], 77 y.o.   MRN: [id number]  HPI: Susan Bennett is a 77 y.o. female presenting on 02/27/2018 for Hypertension (x 2 days- Wed at PT it was 181/72)   HPI Hypertension Patient is currently on benazepril, and their blood pressure today is 158/85. Patient denies any lightheadedness or dizziness. Patient denies headaches, blurred vision, chest pains, shortness of breath, or weakness. Denies any side effects from medication and is content with current medication.   Relevant past medical, surgical, family and social history reviewed and updated as indicated. Interim medical history since our last visit reviewed. Allergies and medications reviewed and updated.  Review of Systems  Constitutional: Negative for chills and fever.  Eyes: Negative for visual disturbance.  Respiratory: Negative for chest tightness and shortness of breath.   Cardiovascular: Negative for chest pain and leg swelling.  Musculoskeletal: Negative for back pain and gait problem.  Skin: Negative for rash.  Neurological: Negative for dizziness, weakness, light-headedness, numbness and headaches.  Psychiatric/Behavioral: Negative for agitation and behavioral problems.  All other systems reviewed and are negative.   Per HPI unless specifically indicated above   Allergies as of 02/27/2018   No Known Allergies     Medication List        Accurate as of 02/27/18  1:59 PM. Always use your most recent med list.          ALPRAZolam 0.25 MG tablet Commonly known as:  XANAX Take 0.25 mg by mouth 2 (two) times daily as needed for anxiety.   amLODipine 5 MG tablet Commonly known as:  NORVASC Take 1 tablet (5 mg total) by mouth daily.   benazepril 40 MG tablet Commonly known as:  LOTENSIN TAKE 1 TABLET BY MOUTH ONCE DAILY     clotrimazole-betamethasone cream Commonly known as:  LOTRISONE Apply 1 application topically 2 (two) times daily.   diclofenac 50 MG EC tablet Commonly known as:  VOLTAREN Take 1 tablet (50 mg total) by mouth 2 (two) times daily.   memantine 10 MG tablet Commonly known as:  NAMENDA Take 0.5 tablets (5 mg total) by mouth 2 (two) times daily.   sertraline 50 MG tablet Commonly known as:  ZOLOFT Take 1 tablet (50 mg total) by mouth daily.          Objective:    BP (!) 158/85   Pulse (!) 54   Temp (!) 97 F (36.1 C) (Oral)   Ht 5\' 3"  (1.6 m)   Wt 111 lb (50.3 kg)   BMI 19.66 kg/m   Wt Readings from Last 3 Encounters:  02/27/18 111 lb (50.3 kg)  02/12/18 112 lb 6.4 oz (51 kg)  01/09/18 111 lb (50.3 kg)    Physical Exam  Constitutional: She is oriented to person, place, and time. She appears well-developed and well-nourished. No distress.  Eyes: Conjunctivae are normal.  Cardiovascular: Normal rate, regular rhythm, normal heart sounds and intact distal pulses.  No murmur heard. Pulmonary/Chest: Effort normal and breath sounds normal. No respiratory distress. She has no wheezes.  Musculoskeletal: Normal range of motion. She exhibits no edema.  Neurological: She is alert and oriented to person, place, and time. Coordination normal.  Skin: Skin  is warm and dry. No rash noted. She is not diaphoretic.  Psychiatric: She has a normal mood and affect. Her behavior is normal.  Nursing note and vitals reviewed.     Assessment & Plan:   Problem List Items Addressed This Visit      Cardiovascular and Mediastinum   Essential hypertension - Primary   Relevant Medications   amLODipine (NORVASC) 5 MG tablet       Follow up plan: Return in about 2 weeks (around 03/13/2018), or if symptoms worsen or fail to improve, for Hypertension follow-up with PCP Lawanna Kobus.  Counseling provided for all of the vaccine components No orders of the defined types were placed in this  encounter.   Arville Care, MD [facility] 02/27/2018, 1:59 PM

## 2018-03-04 ENCOUNTER — Encounter: Payer: Self-pay | Admitting: Physical Therapy

## 2018-03-04 ENCOUNTER — Telehealth: Payer: Self-pay | Admitting: Physician Assistant

## 2018-03-04 ENCOUNTER — Ambulatory Visit: Payer: Medicare Other | Attending: Physician Assistant | Admitting: Physical Therapy

## 2018-03-04 VITALS — BP 176/74 | HR 52

## 2018-03-04 DIAGNOSIS — M545 Low back pain, unspecified: Secondary | ICD-10-CM

## 2018-03-04 NOTE — Telephone Encounter (Signed)
Patient came in and was seen by Dr. Louanne Skye.  He added amlodipine 5 mg to her benazepril 40 mg.   Her evening blood pressures for the past days have been:   188/78,  186/64,  176/78,  184/76,  198/78.   Please advise on what should be done next.

## 2018-03-04 NOTE — Telephone Encounter (Signed)
Daughter aware.

## 2018-03-04 NOTE — Telephone Encounter (Signed)
The medication needs another week or so to see it's full effect on the BP, so continue to monitor and call back or can make a follow up in 1 week or so.

## 2018-03-04 NOTE — Therapy (Signed)
[facility] [address] [place], [place], [address] Phone: [phone]   Fax:  [phone]  Physical Therapy Treatment  Patient Details  Name: [place] Bennett MRN: [id number] Date of Birth: Oct 30, 1940 Referring Provider (PT): Prudy Feeler PA-C.   Encounter Date: 03/04/2018  PT End of Session - 03/04/18 1123    Visit Number  13    Number of Visits  16    Date for PT Re-Evaluation  03/25/18    Authorization Type  FOTO AT LEAST EVERY 5TH VISIT, 10TH VISIT PROGRESS NOTE AND KX MODIFIER AFTER THE 15 VISIT.    PT Start Time  1117    PT Stop Time  1203    PT Time Calculation (min)  46 min    Activity Tolerance  Patient tolerated treatment well    Behavior During Therapy  WFL for tasks assessed/performed       Past Medical History:  Diagnosis Date  . Chronic diarrhea   . Contact dermatitis and other eczema, due to unspecified cause   . Depressive disorder, not elsewhere classified   . Osteoporosis, unspecified   . Other and unspecified hyperlipidemia   . Other B-complex deficiencies   . Unspecified essential hypertension   . Unspecified gastritis and gastroduodenitis without mention of hemorrhage   . Unspecified vitamin D deficiency     Past Surgical History:  Procedure Laterality Date  . COLONOSCOPY     9 YEARS AGO    Vitals:   03/04/18 1124  BP: (!) 176/74  Pulse: (!) 52    Subjective Assessment - 03/04/18 1122    Subjective  Reports that her back has improved but her BP is still high after adding another medication. Reports that they are currently waiting on a car from the MD.    Patient is accompained by:  Family member    Pertinent History  OP; some cognitive impairment.    Diagnostic tests  X-ray...please look under "imaging" tab.    Patient Stated Goals  Get out of pain.    Currently in Pain?  No/denies         [facility] PT Assessment - 03/04/18 0001      Assessment   Medical Diagnosis  Right sided low back pain.       Restrictions   Weight Bearing Restrictions  No                   [facility] Adult PT Treatment/Exercise - 03/04/18 0001      Lumbar Exercises: Aerobic   Nustep  L5 x11 min      Modalities   Modalities  Electrical Stimulation;Moist Heat      Moist Heat Therapy   Number Minutes Moist Heat  15 Minutes    Moist Heat Location  Lumbar Spine      Electrical Stimulation   Electrical Stimulation Location  B low back    Electrical Stimulation Action  Pre-Mod    Electrical Stimulation Parameters  80-150 hz x15 min    Electrical Stimulation Goals  Pain      Manual Therapy   Manual Therapy  Soft tissue mobilization    Soft tissue mobilization  STW to R lumbar paraspinals, QL to reduce pain and tenderness               PT Short Term Goals - 12/25/17 1454      PT SHORT TERM GOAL #1   Title  STG's=LTG's.        PT Long  Term Goals - 02/18/18 1244      PT LONG TERM GOAL #1   Title  Independent with HEP.    Time  8    Period  Weeks    Status  On-going   not doing HEP per family member 02/18/18     PT LONG TERM GOAL #2   Title  Perform ADL's with pain not > 2/10.    Time  8    Period  Weeks    Status  On-going      PT LONG TERM GOAL #3   Title  Lift dog with pain not > 2/10.    Time  8    Period  Weeks    Status  Achieved   no difficulty reported 02/18/18           Plan - 03/04/18 1201    Clinical Impression Statement  Patient arrived to clinic with friend with requests to check BP. BP assessed prior to Nustep at 176/74 and then after Nustep for 11 minutes as 160/65. Patient denied any pain during today's treatment. Patient reported tenderness along R lumbar paraspinals region and only minimal muscle tightness palpable. Normal modalities response noted following removal of the modalities. Vitals taken following today's treatment was 142/70, 98%, 55 bpm.    Rehab Potential  Excellent    Clinical Impairments Affecting Rehab Potential  FOTO 5th visit  41% 10th visit 43%    PT Frequency  2x / week    PT Duration  8 weeks    PT Treatment/Interventions  ADLs/Self Care Home Management;Cryotherapy;Electrical Stimulation;Ultrasound;Moist Heat;Therapeutic activities;Therapeutic exercise;Patient/family education;Manual techniques;Dry needling    PT Next Visit Plan  please monitor BP /Continue with nustep and low level core exercises and consider standing progression (mindful of OP) and STW/modalities PRN    Consulted and Agree with Plan of Care  Patient       Patient will benefit from skilled therapeutic intervention in order to improve the following deficits and impairments:  Pain, Increased muscle spasms, Postural dysfunction  Visit Diagnosis: Acute right-sided low back pain without sciatica     Problem List Patient Active Problem List   Diagnosis Date Noted  . Mild cognitive impairment 03/31/2017  . Alzheimer's dementia without behavioral disturbance (HCC) 12/02/2016  . Vitamin B12 deficiency 12/02/2016  . Essential hypertension 04/19/2016  . Mild episode of recurrent major depressive disorder (HCC) 04/19/2016  . Memory loss 04/19/2016    Marvell Fuller, PTA 03/04/2018, 12:19 PM  [facility] [address] [place], [place], [address] Phone: [phone]   Fax:  [phone]  Name: Susan Bennett MRN: [id number] Date of Birth: Jun 05, 1940

## 2018-03-06 ENCOUNTER — Other Ambulatory Visit: Payer: Self-pay | Admitting: Physician Assistant

## 2018-03-06 DIAGNOSIS — I10 Essential (primary) hypertension: Secondary | ICD-10-CM

## 2018-03-06 MED ORDER — AMLODIPINE BESYLATE 10 MG PO TABS: 5.0000 mg | ORAL_TABLET | Freq: Every day | ORAL | 1 refills | Status: DC

## 2018-03-06 NOTE — Telephone Encounter (Signed)
Sent Norvasc 10 mg one dail, this is an increase

## 2018-03-06 NOTE — Telephone Encounter (Signed)
Pt daughter has called this morning said that last night her BP was 204/78 was sweaty, not feeling right, was breathing little harder than normal. This morning her BP was 170/76 daughter is concerned, she understands that it can take a while for medication to get into system but she is just concerned with her moms BP and wants to know what AJ suggest.

## 2018-03-06 NOTE — Telephone Encounter (Signed)
Daughter aware and verbalizes understanding. 

## 2018-03-11 ENCOUNTER — Ambulatory Visit (INDEPENDENT_AMBULATORY_CARE_PROVIDER_SITE_OTHER): Payer: Medicare Other | Admitting: Physician Assistant

## 2018-03-11 ENCOUNTER — Ambulatory Visit: Payer: Medicare Other | Admitting: Physical Therapy

## 2018-03-11 ENCOUNTER — Encounter: Payer: Self-pay | Admitting: Physician Assistant

## 2018-03-11 ENCOUNTER — Encounter: Payer: Self-pay | Admitting: Physical Therapy

## 2018-03-11 VITALS — BP 157/71

## 2018-03-11 DIAGNOSIS — M545 Low back pain, unspecified: Secondary | ICD-10-CM

## 2018-03-11 DIAGNOSIS — I10 Essential (primary) hypertension: Secondary | ICD-10-CM

## 2018-03-11 MED ORDER — AMLODIPINE BESYLATE 10 MG PO TABS: 10.0000 mg | ORAL_TABLET | Freq: Every day | ORAL | 1 refills | Status: DC

## 2018-03-11 NOTE — Therapy (Addendum)
[facility] [place], [place], [address] Phone: [phone]   Fax:  [phone]  Physical Therapy Treatment  Patient Details  Name: Susan Bennett MRN: [id number] Date of Birth: Aug 02, 1940 Referring Provider (PT): Particia Nearing PA-C.   Encounter Date: 03/11/2018  PT End of Session - 03/11/18 1341    Visit Number  14    Number of Visits  16    Date for PT Re-Evaluation  03/25/18    Authorization Type  FOTO AT LEAST EVERY 5TH VISIT, 10TH VISIT PROGRESS NOTE AND KX MODIFIER AFTER THE 15 VISIT.    PT Start Time  1257    PT Stop Time  1349    PT Time Calculation (min)  52 min    Activity Tolerance  Patient tolerated treatment well    Behavior During Therapy  WFL for tasks assessed/performed       Past Medical History:  Diagnosis Date  . Chronic diarrhea   . Contact dermatitis and other eczema, due to unspecified cause   . Depressive disorder, not elsewhere classified   . Osteoporosis, unspecified   . Other and unspecified hyperlipidemia   . Other B-complex deficiencies   . Unspecified essential hypertension   . Unspecified gastritis and gastroduodenitis without mention of hemorrhage   . Unspecified vitamin D deficiency     Past Surgical History:  Procedure Laterality Date  . COLONOSCOPY     9 YEARS AGO    Vitals:   03/11/18 1301  BP: (!) 157/71    Subjective Assessment - 03/11/18 1301    Subjective  Patient arrived and reported doing well, family member reported this may be last day due to patient doing as well as she can and will go to MD to decide to DC    Patient is accompained by:  Family member    Pertinent History  OP; some cognitive impairment.    Diagnostic tests  X-ray...please look under "imaging" tab.    Currently in Pain?  --   no number given today   Pain Location  Back    Pain Orientation  Right    Pain Onset  More than a month ago    Pain Frequency  Intermittent    Aggravating Factors   certain  activity at home    Pain Relieving Factors  at rest                       [facility] Adult PT Treatment/Exercise - 03/11/18 0001      Exercises   Exercises  Lumbar;Knee/Hip      Lumbar Exercises: Aerobic   Nustep  L5 x5mn UE/LE activity, monitored for progression      Lumbar Exercises: Supine   Bridge  20 reps      Knee/Hip Exercises: Standing   Heel Raises  20 reps    Knee Flexion  Strengthening;Both;20 reps    Hip Abduction  Stengthening;Both;20 reps;Knee straight    Hip Extension  Stengthening;Both;20 reps;Knee straight      Knee/Hip Exercises: Seated   Long Arc Quad  Strengthening;Both;3 sets;10 reps    Knee/Hip Flexion  2x10    Other Seated Knee/Hip Exercises  heel lift bil 2x10    Marching  Strengthening;Both;3 sets;10 reps    Abduction/Adduction   Strengthening;Both;20 reps;10 reps   yellow t-band   Sit to Sand  10 reps      Moist Heat Therapy   Number Minutes Moist Heat  15 Minutes  Moist Heat Location  Lumbar Spine      Electrical Stimulation   Electrical Stimulation Location  B low back    Electrical Stimulation Action  premod     Electrical Stimulation Parameters  80-_0  x27mn    Electrical Stimulation Goals  Pain             PT Education - 03/11/18 1329    Education Details  HEP     Person(s) Educated  Patient;Other (comment)    Methods  Explanation;Demonstration;Handout    Comprehension  Verbalized understanding;Returned demonstration       PT Short Term Goals - 12/25/17 1454      PT SHORT TERM GOAL #1   Title  STG's=LTG's.        PT Long Term Goals - 03/11/18 1307      PT LONG TERM GOAL #1   Title  Independent with HEP.    Time  8    Period  Weeks    Status  On-going   family member reported that patient will not do exercises at home 03/11/18     PT LONG TERM GOAL #2   Title  Perform ADL's with pain not > 2/10.    Time  8    Period  Weeks    Status  Achieved   2/10 at times per repoted by patient and family  member 03/11/18     PT LONG TERM GOAL #3   Title  Lift dog with pain not > 2/10.    Time  8    Period  Weeks    Status  Achieved            Plan - 03/11/18 1310    Clinical Impression Statement  Patient tolerated treatment well. Upon arrival BP 157/71 then after 143m of nustep 166/66, and 148/69 after 22m57mof standing activity. HEP provided today to patient and family member with education on daily exercises and activity to improve functional independence. Patient and family member has reported overall improvement and only 2/10 discomfort at times with certain movements and activity at home.  All current goals met except first one due to patient living alone and not doing HEP on her own. Patient going to MD to decide DC, patients family member feels like she has progressed as well as she can for her condition. MD note sent today.     Rehab Potential  Excellent    Clinical Impairments Affecting Rehab Potential  FOTO 5th visit 41% 10th visit 43%    PT Frequency  2x / week    PT Duration  8 weeks    PT Treatment/Interventions  ADLs/Self Care Home Management;Cryotherapy;Electrical Stimulation;Ultrasound;Moist Heat;Therapeutic activities;Therapeutic exercise;Patient/family education;Manual techniques;Dry needling    PT Next Visit Plan  to MD for possible DC per discression    Consulted and Agree with Plan of Care  Patient       Patient will benefit from skilled therapeutic intervention in order to improve the following deficits and impairments:  Pain, Increased muscle spasms, Postural dysfunction  Visit Diagnosis: Acute right-sided low back pain without sciatica     Problem List Patient Active Problem List   Diagnosis Date Noted  . Mild cognitive impairment 03/31/2017  . Alzheimer's dementia without behavioral disturbance ([place]CC[place]7/07/2016  . Vitamin B12 deficiency 12/02/2016  . Essential hypertension 04/19/2016  . Mild episode of recurrent major depressive disorder ([place]CC[place]11/17/2017  . Memory loss 04/19/2016    ChrLadean RayaTA 03/11/18 1:55 PM  [facility] [place], [place], [address] Phone: [phone]   Fax:  [phone]  Name: [place] MRN: [id number] Date of Birth: 03-20-1941  PHYSICAL THERAPY DISCHARGE SUMMARY  Visits from Start of Care: 14.  Current functional level related to goals / functional outcomes: See above.   Remaining deficits: Goals essentially met.   Education / Equipment: HEP. Plan: Patient agrees to discharge.  Patient goals were met. Patient is being discharged due to meeting the stated rehab goals.  ?????         Mali Applegate MPT

## 2018-03-11 NOTE — Progress Notes (Signed)
BP (!) 151/65   Pulse (!) 52   Temp (!) 97 F (36.1 C) (Oral)   Ht 5\' 3"  (1.6 m)   Wt 111 lb 6.4 oz (50.5 kg)   BMI 19.73 kg/m    Subjective:    Patient ID: [place], female    DOB: [birth year], 77 y.o.   MRN: [id number]  HPI: [place] is a 77 y.o. female presenting on 03/11/2018 for Hypertension (2 week follow up )  This patient comes in for recheck on her blood pressure.  Over the past few weeks her blood pressure was found to be very elevated.  She had gotten up into the 180s over 70s to 80s.  She is currently taking benazepril 40 mg 1 daily.  She was started on amlodipine 10 mg 1/2tablet daily for the past 5 days.  There is been a great decrease in her blood pressure readings after starting the medication.  On Thursday last week she had an episode where she felt somewhat confused, had some sweating.  Her daughter was there to witness it.  Her blood pressure at that time was 207/71.  It did come down after this.  And the blood pressure medication was changed.  We discussed that if she ever had an episode like this again and it is persistent they are to go to the emergency room.  She understands.  Past Medical History:  Diagnosis Date  . Chronic diarrhea   . Contact dermatitis and other eczema, due to unspecified cause   . Depressive disorder, not elsewhere classified   . Osteoporosis, unspecified   . Other and unspecified hyperlipidemia   . Other B-complex deficiencies   . Unspecified essential hypertension   . Unspecified gastritis and gastroduodenitis without mention of hemorrhage   . Unspecified vitamin D deficiency    Relevant past medical, surgical, family and social history reviewed and updated as indicated. Interim medical history since our last visit reviewed. Allergies and medications reviewed and updated. DATA REVIEWED: CHART IN EPIC  Family History reviewed for pertinent findings.  Review of Systems  Constitutional: Negative.   HENT: Negative.   Eyes:  Negative.   Respiratory: Negative.   Gastrointestinal: Negative.   Genitourinary: Negative.     Allergies as of 03/11/2018   No Known Allergies     Medication List        Accurate as of 03/11/18  2:39 PM. Always use your most recent med list.          ALPRAZolam 0.25 MG tablet Commonly known as:  XANAX Take 0.25 mg by mouth 2 (two) times daily as needed for anxiety.   amLODipine 10 MG tablet Commonly known as:  NORVASC Take 1 tablet (10 mg total) by mouth daily.   benazepril 40 MG tablet Commonly known as:  LOTENSIN TAKE 1 TABLET BY MOUTH ONCE DAILY   clotrimazole-betamethasone cream Commonly known as:  LOTRISONE Apply 1 application topically 2 (two) times daily.   diclofenac 50 MG EC tablet Commonly known as:  VOLTAREN Take 1 tablet (50 mg total) by mouth 2 (two) times daily.   memantine 10 MG tablet Commonly known as:  NAMENDA Take 0.5 tablets (5 mg total) by mouth 2 (two) times daily.   sertraline 50 MG tablet Commonly known as:  ZOLOFT Take 1 tablet (50 mg total) by mouth daily.          Objective:    BP (!) 151/65   Pulse (!) 52  Temp (!) 97 F (36.1 C) (Oral)   Ht 5\' 3"  (1.6 m)   Wt 111 lb 6.4 oz (50.5 kg)   BMI 19.73 kg/m   No Known Allergies  Wt Readings from Last 3 Encounters:  03/11/18 111 lb 6.4 oz (50.5 kg)  02/27/18 111 lb (50.3 kg)  02/12/18 112 lb 6.4 oz (51 kg)    Physical Exam  Constitutional: She is oriented to person, place, and time. She appears well-developed and well-nourished.  HENT:  Head: Normocephalic and atraumatic.  Eyes: Pupils are equal, round, and reactive to light. Conjunctivae and EOM are normal.  Cardiovascular: Normal rate, regular rhythm, normal heart sounds and intact distal pulses.  Pulmonary/Chest: Effort normal and breath sounds normal.  Abdominal: Soft. Bowel sounds are normal.  Neurological: She is alert and oriented to person, place, and time. She has normal reflexes.  Skin: Skin is warm and dry. No  rash noted.  Psychiatric: She has a normal mood and affect. Her behavior is normal. Judgment and thought content normal.        Assessment & Plan:   1. Essential hypertension - amLODipine (NORVASC) 10 MG tablet; Take 1 tablet (10 mg total) by mouth daily.  Dispense: 90 tablet; Refill: 1   Continue all other maintenance medications as listed above.  Follow up plan: Return in about 3 months (around 06/11/2018) for recheck.  Educational handout given for survey  Remus Loffler PA-C [facility] [address]  [place], [place] [address] [phone]   03/11/2018, 2:39 PM

## 2018-03-11 NOTE — Patient Instructions (Signed)
  Half Squat to Chair   Stand with feet shoulder width apart. Push buttocks backward and lower slowly, sitting in chair lightly and returning to standing position. Complete _2_ sets of 10_ repetitions. Perform __2-3_ sessions per day.    Toe-Up (Ankle Plantar Flexion and Dorsiflexion)   Holding a stable object, rise up on toes. Hold ____ seconds. Then rock back on heels and Hold 2____ seconds. Repeat _5-10___ times. Do _1-2___ sessions per day.   High Stepping   Using support, lift knees, taking high steps. Repeat __5-10__ times. Do __1-2__ sessions per day.   Walk to the Side   Step to the side with stronger leg and follow with involved leg. Then return. Hold chair if necessary. Repeat __5-10__ times. Do _1-2___ sessions per day.    Bridging   Slowly raise buttocks from floor, keeping stomach tight. Repeat _10___ times per set. Do __2__ sets per session. Do __2__ sessions per day.  Knee High   Holding stable object, raise knee to hip level, then lower knee. Repeat with other knee. Complete __10-30_ repetitions. Do __2__ sessions per day.        Hip abduction   While sitting with good posture, tie theraband around knees and pull apart. Slowly resume starting position. x30 1-2 x day   Heel Raise (Sitting)    Raise heels, keeping toes on floor.  Tighten abdomin and buttock (draw in) Repeat __10__ times per set. Do __1-2__ sets per session. Do __1-2__ sessions per day.   FLEXION: Sitting (Active)    Sit, both feet flat. Lift right knee toward ceiling. Tighten abdomin and buttock (draw in) Complete _10__ sets of _1-2__ repetitions. Perform _1-2__ sessions per day.

## 2018-03-12 ENCOUNTER — Ambulatory Visit: Payer: Medicare Other | Admitting: Physician Assistant

## 2018-03-12 DIAGNOSIS — H40013 Open angle with borderline findings, low risk, bilateral: Secondary | ICD-10-CM | POA: Diagnosis not present

## 2018-03-12 DIAGNOSIS — H35033 Hypertensive retinopathy, bilateral: Secondary | ICD-10-CM | POA: Diagnosis not present

## 2018-03-12 DIAGNOSIS — Z961 Presence of intraocular lens: Secondary | ICD-10-CM | POA: Diagnosis not present

## 2018-03-16 ENCOUNTER — Other Ambulatory Visit: Payer: Self-pay | Admitting: Physician Assistant

## 2018-03-17 ENCOUNTER — Other Ambulatory Visit: Payer: Self-pay | Admitting: Physician Assistant

## 2018-03-17 MED ORDER — SERTRALINE HCL 50 MG PO TABS: 50.0000 mg | ORAL_TABLET | Freq: Every day | ORAL | 3 refills | Status: DC

## 2018-03-17 NOTE — Telephone Encounter (Signed)
Patient daughter aware. °

## 2018-03-17 NOTE — Telephone Encounter (Signed)
sent 

## 2018-04-16 ENCOUNTER — Other Ambulatory Visit: Payer: Self-pay | Admitting: Physician Assistant

## 2018-04-16 DIAGNOSIS — I10 Essential (primary) hypertension: Secondary | ICD-10-CM

## 2018-05-28 ENCOUNTER — Other Ambulatory Visit: Payer: Self-pay | Admitting: Physician Assistant

## 2018-05-28 DIAGNOSIS — G309 Alzheimer's disease, unspecified: Principal | ICD-10-CM

## 2018-05-28 DIAGNOSIS — F0280 Dementia in other diseases classified elsewhere without behavioral disturbance: Secondary | ICD-10-CM

## 2018-10-02 ENCOUNTER — Telehealth: Payer: Self-pay | Admitting: Physician Assistant

## 2018-10-15 ENCOUNTER — Other Ambulatory Visit: Payer: Self-pay | Admitting: Physician Assistant

## 2018-10-15 DIAGNOSIS — I10 Essential (primary) hypertension: Secondary | ICD-10-CM

## 2018-12-04 ENCOUNTER — Other Ambulatory Visit: Payer: Self-pay

## 2018-12-07 ENCOUNTER — Other Ambulatory Visit: Payer: Self-pay

## 2018-12-07 ENCOUNTER — Ambulatory Visit (INDEPENDENT_AMBULATORY_CARE_PROVIDER_SITE_OTHER): Payer: Medicare Other | Admitting: Physician Assistant

## 2018-12-07 DIAGNOSIS — G309 Alzheimer's disease, unspecified: Secondary | ICD-10-CM

## 2018-12-07 DIAGNOSIS — F0280 Dementia in other diseases classified elsewhere without behavioral disturbance: Secondary | ICD-10-CM

## 2018-12-07 DIAGNOSIS — I10 Essential (primary) hypertension: Secondary | ICD-10-CM

## 2018-12-07 DIAGNOSIS — E538 Deficiency of other specified B group vitamins: Secondary | ICD-10-CM | POA: Diagnosis not present

## 2018-12-07 MED ORDER — BENAZEPRIL HCL 40 MG PO TABS: 40.0000 mg | ORAL_TABLET | Freq: Every day | ORAL | 1 refills | Status: DC

## 2018-12-07 MED ORDER — MEMANTINE HCL 10 MG PO TABS: ORAL_TABLET | ORAL | 11 refills | Status: DC

## 2018-12-09 ENCOUNTER — Ambulatory Visit (INDEPENDENT_AMBULATORY_CARE_PROVIDER_SITE_OTHER): Payer: Medicare Other | Admitting: *Deleted

## 2018-12-09 ENCOUNTER — Other Ambulatory Visit: Payer: Self-pay

## 2018-12-09 DIAGNOSIS — E538 Deficiency of other specified B group vitamins: Secondary | ICD-10-CM

## 2018-12-09 NOTE — Progress Notes (Signed)
Pt given Cyanocobalamin inj Tolerated well 

## 2018-12-14 ENCOUNTER — Encounter: Payer: Self-pay | Admitting: Physician Assistant

## 2018-12-14 NOTE — Progress Notes (Signed)
Telephone visit  Subjective: CC: Recheck on chronic medical conditions PCP: Remus LofflerJones, Diora Bellizzi S, PA-C XBJ:YNWGHPI:Susan Bennett is a 78 y.o. female calls for telephone consult today. Patient provides verbal consent for consult held via phone.  Patient is identified with 2 separate identifiers.  At this time the entire area is on COVID-19 social distancing and stay home orders are in place.  Patient is of higher risk and therefore we are performing this by a virtual method.  Location of patient: Home Location of provider: [facility] Others present for call: Daughter  The patient is having a.  Recheck on her chronic medical conditions which do include hypertension, dementia, B12 deficiency.  She has not been coming and getting her B12 shots.  I have reiterated to her and to her daughter that this hurts her dementia.  It can make things worse.  B12 deficiency can cause its own symptoms of dementia.  They are going to get restarted with coming on a regular basis.  She has been taking Namenda at a 5 mg dose we are going to increase her to 10 mg.    ROS: Per HPI  No Known Allergies Past Medical History:  Diagnosis Date  . Chronic diarrhea   . Contact dermatitis and other eczema, due to unspecified cause   . Depressive disorder, not elsewhere classified   . Osteoporosis, unspecified   . Other and unspecified hyperlipidemia   . Other B-complex deficiencies   . Unspecified essential hypertension   . Unspecified gastritis and gastroduodenitis without mention of hemorrhage   . Unspecified vitamin D deficiency     Current Outpatient Medications:  .  amLODipine (NORVASC) 10 MG tablet, Take 1 tablet (10 mg total) by mouth daily., Disp: 90 tablet, Rfl: 1 .  benazepril (LOTENSIN) 40 MG tablet, Take 1 tablet (40 mg total) by mouth daily., Disp: 90 tablet, Rfl: 1 .  clotrimazole-betamethasone (LOTRISONE) cream, Apply 1 application topically 2 (two) times daily., Disp: 45 g, Rfl: 5 .  diclofenac (VOLTAREN)  50 MG EC tablet, Take 1 tablet (50 mg total) by mouth 2 (two) times daily., Disp: 60 tablet, Rfl: 2 .  memantine (NAMENDA) 10 MG tablet, TAKE 1/2 (ONE-HALF) TABLET BY MOUTH TWICE DAILY, Disp: 60 tablet, Rfl: 11 .  sertraline (ZOLOFT) 50 MG tablet, Take 1 tablet (50 mg total) by mouth daily., Disp: 90 tablet, Rfl: 3  Current Facility-Administered Medications:  .  cyanocobalamin ((VITAMIN B-12)) injection 1,000 mcg, 1,000 mcg, Intramuscular, Q30 days, Prudy FeelerJones, Etoy Mcdonnell S, PA-C, 1,000 mcg at 12/09/18 1426  Assessment/ Plan: 78 y.o. female   1. Essential hypertension - benazepril (LOTENSIN) 40 MG tablet; Take 1 tablet (40 mg total) by mouth daily.  Dispense: 90 tablet; Refill: 1  2. Alzheimer's dementia without behavioral disturbance, unspecified timing of dementia onset (HCC) - memantine (NAMENDA) 10 MG tablet; TAKE 1/2 (ONE-HALF) TABLET BY MOUTH TWICE DAILY  Dispense: 60 tablet; Refill: 11  3. Vitamin B12 deficiency Restart monthly injections   No follow-ups on file.  Continue all other maintenance medications as listed above.  Start time: 2:30 PM End time: 2:44 PM  Meds ordered this encounter  Medications  . benazepril (LOTENSIN) 40 MG tablet    Sig: Take 1 tablet (40 mg total) by mouth daily.    Dispense:  90 tablet    Refill:  1    Order Specific Question:   Supervising Provider    Answer:   Raliegh IpGOTTSCHALK, ASHLY M [[id number]]  . memantine (NAMENDA) 10  MG tablet    Sig: TAKE 1/2 (ONE-HALF) TABLET BY MOUTH TWICE DAILY    Dispense:  60 tablet    Refill:  11    Order Specific Question:   Supervising Provider    Answer:   Janora Norlander [[id number]]    Particia Nearing PA-C [place] [phone]

## 2018-12-28 ENCOUNTER — Other Ambulatory Visit: Payer: Self-pay

## 2018-12-28 ENCOUNTER — Encounter: Payer: Self-pay | Admitting: Physician Assistant

## 2018-12-28 ENCOUNTER — Ambulatory Visit (INDEPENDENT_AMBULATORY_CARE_PROVIDER_SITE_OTHER): Payer: Medicare Other | Admitting: Physician Assistant

## 2018-12-28 ENCOUNTER — Telehealth: Payer: Self-pay | Admitting: Physician Assistant

## 2018-12-28 VITALS — BP 179/69 | HR 57 | Temp 99.0°F | Ht 63.0 in | Wt 111.6 lb

## 2018-12-28 DIAGNOSIS — G8929 Other chronic pain: Secondary | ICD-10-CM

## 2018-12-28 DIAGNOSIS — M25551 Pain in right hip: Secondary | ICD-10-CM

## 2018-12-29 NOTE — Progress Notes (Signed)
BP (!) 179/69   Pulse (!) 57   Temp 99 F (37.2 C) (Oral)   Ht 5\' 3"  (1.6 m)   Wt 111 lb 9.6 oz (50.6 kg)   BMI 19.77 kg/m    Subjective:    Patient ID: Susan Bennett, female    DOB: 1940/06/29, 78 y.o.   MRN: [id number]  HPI: Susan Bennett is a 78 y.o. female presenting on 12/28/2018 for Hip Pain (right )  Pt with mild dementia accompanied by her daughter. Has had worsening right hip pain over the past couple of weeks. She has pain with standing or changing positions. She even hurts when she lays on it. There has been no injury to the hip.Denies any falls, but does have to walk slower to stay steady. Has a walker at home and will be trying to use it more.   Past Medical History:  Diagnosis Date  . Chronic diarrhea   . Contact dermatitis and other eczema, due to unspecified cause   . Depressive disorder, not elsewhere classified   . Osteoporosis, unspecified   . Other and unspecified hyperlipidemia   . Other B-complex deficiencies   . Unspecified essential hypertension   . Unspecified gastritis and gastroduodenitis without mention of hemorrhage   . Unspecified vitamin D deficiency    Relevant past medical, surgical, family and social history reviewed and updated as indicated. Interim medical history since our last visit reviewed. Allergies and medications reviewed and updated. DATA REVIEWED: CHART IN EPIC  Family History reviewed for pertinent findings.  Review of Systems  Constitutional: Negative.   HENT: Negative.   Eyes: Negative.   Respiratory: Negative.   Gastrointestinal: Negative.   Genitourinary: Negative.   Musculoskeletal: Positive for arthralgias and gait problem.    Allergies as of 12/28/2018   No Known Allergies     Medication List       Accurate as of December 28, 2018 11:59 PM. If you have any questions, ask your nurse or doctor.        amLODipine 10 MG tablet Commonly known as: NORVASC Take 1 tablet (10 mg total) by mouth daily.   benazepril 40  MG tablet Commonly known as: LOTENSIN Take 1 tablet (40 mg total) by mouth daily.   clotrimazole-betamethasone cream Commonly known as: LOTRISONE Apply 1 application topically 2 (two) times daily.   diclofenac 50 MG EC tablet Commonly known as: VOLTAREN Take 1 tablet (50 mg total) by mouth 2 (two) times daily.   memantine 10 MG tablet Commonly known as: NAMENDA TAKE 1/2 (ONE-HALF) TABLET BY MOUTH TWICE DAILY   sertraline 50 MG tablet Commonly known as: ZOLOFT Take 1 tablet (50 mg total) by mouth daily.          Objective:    BP (!) 179/69   Pulse (!) 57   Temp 99 F (37.2 C) (Oral)   Ht 5\' 3"  (1.6 m)   Wt 111 lb 9.6 oz (50.6 kg)   BMI 19.77 kg/m   No Known Allergies  Wt Readings from Last 3 Encounters:  12/28/18 111 lb 9.6 oz (50.6 kg)  03/11/18 111 lb 6.4 oz (50.5 kg)  02/27/18 111 lb (50.3 kg)    Physical Exam Constitutional:      Appearance: She is well-developed.  HENT:     Head: Normocephalic and atraumatic.  Eyes:     Conjunctiva/sclera: Conjunctivae normal.     Pupils: Pupils are equal, round, and reactive to light.  Cardiovascular:  Rate and Rhythm: Normal rate and regular rhythm.     Heart sounds: Normal heart sounds.  Pulmonary:     Effort: Pulmonary effort is normal.  Skin:    General: Skin is warm and dry.     Findings: No rash.  Neurological:     Mental Status: She is alert and oriented to person, place, and time.     Deep Tendon Reflexes: Reflexes are normal and symmetric.  Psychiatric:        Behavior: Behavior normal.         Assessment & Plan:   1. Hip pain, chronic, right - Ambulatory referral to Orthopedic Surgery   Continue all other maintenance medications as listed above.  Follow up plan: No follow-ups on file.  Educational handout given for survey  Remus LofflerAngel S. Shirly Bartosiewicz PA-C [facility] [address]  [place], [place] [address] [phone]   12/29/2018, 10:24 PM

## 2019-01-11 ENCOUNTER — Other Ambulatory Visit: Payer: Self-pay | Admitting: Physician Assistant

## 2019-01-11 DIAGNOSIS — I10 Essential (primary) hypertension: Secondary | ICD-10-CM

## 2019-01-11 NOTE — Telephone Encounter (Signed)
Lmtcb.  Patient should have refills on benazepril at [facility]

## 2019-01-13 ENCOUNTER — Ambulatory Visit (INDEPENDENT_AMBULATORY_CARE_PROVIDER_SITE_OTHER): Payer: Medicare Other

## 2019-01-13 ENCOUNTER — Other Ambulatory Visit: Payer: Self-pay

## 2019-01-13 ENCOUNTER — Ambulatory Visit (INDEPENDENT_AMBULATORY_CARE_PROVIDER_SITE_OTHER): Payer: Medicare Other | Admitting: Orthopaedic Surgery

## 2019-01-13 ENCOUNTER — Encounter: Payer: Self-pay | Admitting: Orthopaedic Surgery

## 2019-01-13 VITALS — Ht 63.0 in | Wt 109.0 lb

## 2019-01-13 DIAGNOSIS — M4807 Spinal stenosis, lumbosacral region: Secondary | ICD-10-CM

## 2019-01-13 DIAGNOSIS — M545 Low back pain: Secondary | ICD-10-CM

## 2019-01-13 DIAGNOSIS — G8929 Other chronic pain: Secondary | ICD-10-CM

## 2019-01-13 NOTE — Progress Notes (Signed)
Office Visit Note   Patient: Susan Bennett           Date of Birth: 08-Jan-1941           MRN: [id number] Visit Date: 01/13/2019              Requested by: Terald Sleeper, PA-C [address] [place],  [place] [address] PCP: Terald Sleeper, PA-C   Assessment & Plan: Visit Diagnoses:  1. Chronic right-sided low back pain, unspecified whether sciatica present   2. Spinal stenosis of lumbosacral region     Plan: Diffuse degenerative changes lumbar spine with probable stenosis predominately affecting right lower extremity.  Will order MRI scan of lumbar spine and any pain and start a course of physical therapy at [facility] in [place].  Office visit over 45 minutes reviewing her records from her primary care physician's office as well as discussing her diagnosis and treatment options.  50% of the time in counseling  Follow-Up Instructions: Return After MRI scan lumbar spine.   Orders:  Orders Placed This Encounter  Procedures   XR Pelvis 1-2 Views   XR Lumbar Spine 2-3 Views   MR Lumbar Spine w/o contrast   Ambulatory referral to Physical Therapy   No orders of the defined types were placed in this encounter.     Procedures: No procedures performed   Clinical Data: No additional findings.   Subjective: Chief Complaint  Patient presents with   Right Hip - Pain  Patient presents today for right hip pain. She states that it has hurt for months. No known injury. Her pain is located in her right buttock and radiates down the back of her leg. No numbness or tingling. Her pain is worse upon getting up after resting for a prolonged period. He takes Aspirin for pain. No previous surgery. [name] is accompanied by her daughter who has help with the history.  She has had problem with her right low back for "months".  No injury or trauma.  Her worst position is sitting where she has to change position frequently.  She also has trouble when she stands for any length of time  or walks with pain in her buttock and the posterior aspect of her right leg.  She is not had any numbness or tingling.  She does not have any trouble sleeping.  She is not had any trouble with her left lower extremity.  She has been on diclofenac which is given her some relief of her discomfort.  HPI  Review of Systems   Objective: Vital Signs: Ht 5\' 3"  (1.6 m)    Wt 109 lb (49.4 kg)    BMI 19.31 kg/m   Physical Exam Constitutional:      Appearance: She is well-developed.  Eyes:     Pupils: Pupils are equal, round, and reactive to light.  Pulmonary:     Effort: Pulmonary effort is normal.  Skin:    General: Skin is warm and dry.  Neurological:     Mental Status: She is alert and oriented to person, place, and time.  Psychiatric:        Behavior: Behavior normal.     Ortho Exam evaluated sitting.  Straight leg raise negative.  Painless range of motion of both hips without any loss of motion.  Comparable flexion-extension of both knees without pain.  No distal edema.  Motor exam intact.  Very mild percussible tenderness in the right para lumbosacral region.  No pain in the midline or over the sacrum.  No masses  Specialty Comments:  No specialty comments available.  Imaging: Xr Lumbar Spine 2-3 Views  Result Date: 01/13/2019 AP and lateral views of the lumbar spine were obtained.  There is a left degenerative lumbar scoliosis centered about L3 of just under 10 degrees.  Diffuse degenerative changes throughout the lumbar spine particularly at L3-4 L4-5 and L5-S1 with disc space narrowing and probably some encroachment on the posterior canal.  Minimal ectopic calcification within the abdominal aorta without obvious aneurysmal dilatation.  No listhesis.  No obvious compression fracture  Xr Pelvis 1-2 Views  Result Date: 01/13/2019 AP the pelvis was obtained without significant degenerative changes in either hip.  There may be slight joint space narrowing but patient is asymptomatic.   Sacroiliac joints with mild sclerosis.  Considerable degenerative change none a limited AP view of the lower lumbar spine which will be addressed on the plain films of the lumbar spine.    PMFS History: Patient Active Problem List   Diagnosis Date Noted   Mild cognitive impairment 03/31/2017   Alzheimer's dementia without behavioral disturbance (HCC) 12/02/2016   Vitamin B12 deficiency 12/02/2016   Essential hypertension 04/19/2016   Mild episode of recurrent major depressive disorder (HCC) 04/19/2016   Memory loss 04/19/2016   Past Medical History:  Diagnosis Date   Chronic diarrhea    Contact dermatitis and other eczema, due to unspecified cause    Depressive disorder, not elsewhere classified    Osteoporosis, unspecified    Other and unspecified hyperlipidemia    Other B-complex deficiencies    Unspecified essential hypertension    Unspecified gastritis and gastroduodenitis without mention of hemorrhage    Unspecified vitamin D deficiency     Family History  Problem Relation Age of Onset   Healthy Sister    Healthy Brother    Throat cancer Brother    Stroke Brother    Heart disease Brother    Throat cancer Maternal Aunt     Past Surgical History:  Procedure Laterality Date   COLONOSCOPY     9 YEARS AGO   Social History   Occupational History   Occupation: retired   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

## 2019-01-18 ENCOUNTER — Other Ambulatory Visit: Payer: Self-pay

## 2019-01-20 ENCOUNTER — Ambulatory Visit (HOSPITAL_COMMUNITY)
Admission: RE | Admit: 2019-01-20 | Discharge: 2019-01-20 | Disposition: A | Discharge status: Home / Self Care | Payer: Medicare Other | Source: Ambulatory Visit | Attending: Orthopaedic Surgery | Admitting: Orthopaedic Surgery

## 2019-01-20 ENCOUNTER — Ambulatory Visit (INDEPENDENT_AMBULATORY_CARE_PROVIDER_SITE_OTHER): Payer: Medicare Other | Admitting: *Deleted

## 2019-01-20 ENCOUNTER — Encounter: Payer: Self-pay | Admitting: Physical Therapy

## 2019-01-20 ENCOUNTER — Other Ambulatory Visit: Payer: Self-pay

## 2019-01-20 ENCOUNTER — Ambulatory Visit: Payer: Medicare Other | Attending: Orthopaedic Surgery | Admitting: Physical Therapy

## 2019-01-20 DIAGNOSIS — M47816 Spondylosis without myelopathy or radiculopathy, lumbar region: Secondary | ICD-10-CM | POA: Diagnosis not present

## 2019-01-20 DIAGNOSIS — M545 Low back pain, unspecified: Secondary | ICD-10-CM

## 2019-01-20 DIAGNOSIS — M5127 Other intervertebral disc displacement, lumbosacral region: Secondary | ICD-10-CM | POA: Diagnosis not present

## 2019-01-20 DIAGNOSIS — M6281 Muscle weakness (generalized): Secondary | ICD-10-CM

## 2019-01-20 DIAGNOSIS — E538 Deficiency of other specified B group vitamins: Secondary | ICD-10-CM | POA: Diagnosis not present

## 2019-01-20 DIAGNOSIS — M4807 Spinal stenosis, lumbosacral region: Secondary | ICD-10-CM

## 2019-01-20 DIAGNOSIS — M48061 Spinal stenosis, lumbar region without neurogenic claudication: Secondary | ICD-10-CM | POA: Diagnosis not present

## 2019-01-20 NOTE — Therapy (Signed)
[facility] [address] [place], [place], [address] Phone: [phone]   Fax:  [phone]  Physical Therapy Treatment  Patient Details  Name: Susan Bennett MRN: [id number] Date of Birth: 11-06-1940 Referring Provider (PT): Norlene CampbellPeter Whitfield, MD   Encounter Date: 01/20/2019  PT End of Session - 01/20/19 1433    Visit Number  1    Number of Visits  8    Date for PT Re-Evaluation  03/24/19    Authorization Type  Progress note every 10th visit; KX modifier at 15th    PT Start Time  1345    PT Stop Time  1415   requested to leave early for another appointment   PT Time Calculation (min)  30 min    Activity Tolerance  Patient tolerated treatment well    Behavior During Therapy  [facility] for tasks assessed/performed       Past Medical History:  Diagnosis Date  . Chronic diarrhea   . Contact dermatitis and other eczema, due to unspecified cause   . Depressive disorder, not elsewhere classified   . Osteoporosis, unspecified   . Other and unspecified hyperlipidemia   . Other B-complex deficiencies   . Unspecified essential hypertension   . Unspecified gastritis and gastroduodenitis without mention of hemorrhage   . Unspecified vitamin D deficiency     Past Surgical History:  Procedure Laterality Date  . COLONOSCOPY     9 YEARS AGO    There were no vitals filed for this visit.  Subjective Assessment - 01/20/19 1429    Subjective  COVID-19 screening performed upon arrival.Patient arrives to physical therapy with her niece with reports of low back pain R>L and difficulties with transitions when sitting for long periods of time. Patient's niece reports patient lives a sedentary lifestyle and only gets up from the recliner when necessary. Patient reports she has difficulties with getting in/out of her tub but is getting grab bars installed and is getting a tub bench. Patient has family support from her daughter and her niece who visit  frequently. Patient's goals are to decrease low back pain and improve ability to perform home activities.    Pertinent History  Osteoporosis    How long can you sit comfortably?  unlimited in recliner but pain when coming to standing.    How long can you walk comfortably?  between rooms    Diagnostic tests  MRI: 01/20/2019    Currently in Pain?  Yes    Pain Score  4     Pain Location  Back    Pain Orientation  Right;Lower    Pain Descriptors / Indicators  Sore;Aching;Burning    Pain Type  Chronic pain    Pain Radiating Towards  right knee    Pain Onset  More than a month ago    Pain Frequency  Constant    Aggravating Factors   transitions from sitting to standing, walking    Pain Relieving Factors  sitting and heating pad         [facility] PT Assessment - 01/20/19 0001      Assessment   Medical Diagnosis  Chronic right-sided low back pain, unspecified whether sciatica is present; spinal stenosis of lumbosacral region    Referring Provider (PT)  Norlene CampbellPeter Whitfield, MD    Onset Date/Surgical Date  --   ongoing   Next MD Visit  to be determined    Prior Therapy  yes      Precautions   Precautions  Fall      Restrictions   Weight Bearing Restrictions  No      Balance Screen   Has the patient fallen in the past 6 months  No    Has the patient had a decrease in activity level because of a fear of falling?   Yes    Is the patient reluctant to leave their home because of a fear of falling?   No      Home Nurse, mental healthnvironment   Living Environment  Private residence    Living Arrangements  Alone      Prior Function   Level of Independence  Independent with basic ADLs      ROM / Strength   AROM / PROM / Strength  Strength      Strength   Strength Assessment Site  Knee;Hip    Right/Left Hip  Right;Left    Right Hip Flexion  4-/5    Right Hip Extension  3+/5    Right Hip ABduction  3+/5    Left Hip Flexion  4-/5    Left Hip Extension  3+/5    Left Hip ABduction  3+/5    Right/Left Knee   Right;Left    Right Knee Flexion  3+/5    Right Knee Extension  4/5    Left Knee Flexion  4-/5    Left Knee Extension  4/5      Flexibility   Soft Tissue Assessment /Muscle Length  yes    Hamstrings  L>R      Palpation   Palpation comment  tender to palpation to right lumbar paraspinals, right QL and right hamstring      Transfers   Comments  increased time to perform transfers; supine to long sit transition.      Ambulation/Gait   Gait Pattern  Step-through pattern;Decreased stride length;Decreased stance time - right;Decreased weight shift to right;Antalgic;Trunk flexed    Gait Comments  hand held assist from niece cautious gait                           PT Education - 01/20/19 1431    Education Details  draw ins, hip adduction, marching    Person(s) Educated  Patient;Other (comment)   Niece   Methods  Explanation;Demonstration;Handout    Comprehension  Verbalized understanding;Returned demonstration          PT Long Term Goals - 01/20/19 1434      PT LONG TERM GOAL #1   Title  Patient will be independent with HEP.    Time  8    Period  Weeks    Status  New      PT LONG TERM GOAL #2   Title  Patient will report ability to perform ADLs and dog care giving activities with low back pain less than or equal to 3/10    Time  8    Period  Weeks    Status  New      PT LONG TERM GOAL #3   Title  Patient will demonstrate 4+/5 bilateral LE strength to improve stablity during functional tasks.    Time  8    Period  Weeks    Status  New            Plan - 01/20/19 1510    Clinical Impression Statement  Patient is a 78 year old female who presents to physical therapy with low back pain R>L, radiating symptoms to  posterior right knee, and decreased MMT bilaterally. Patient is a poor historian and niece was able to help recall medical history. Patient requires increased time for transitions from sit to stand, bed mobility, and supine to sit. Patient  ambulates with hand held assist from niece with decreased gait speed, decreased stride length, and decreased stance time on right. Patient, patient's niece and PT discussed and reviewed HEP to which patient reported understanding. Patient would benefit from skilled physical therapy to address deficits and address patient's goals.    Personal Factors and Comorbidities  Age;Comorbidity 1    Comorbidities  osteoporosis    Examination-Activity Limitations  Bed Mobility;Bathing;Bend;Carry;Dressing;Toileting;Stand;Locomotion Level;Transfers    Stability/Clinical Decision Making  Stable/Uncomplicated    Clinical Decision Making  Low    Rehab Potential  Fair    PT Frequency  1x / week    PT Duration  8 weeks    PT Treatment/Interventions  ADLs/Self Care Home Management;Cryotherapy;Electrical Stimulation;Iontophoresis 4mg /ml Dexamethasone;Functional mobility training;Moist Heat;Ultrasound;Therapeutic exercise;Therapeutic activities;Stair training;Gait training;Neuromuscular re-education;Patient/family education;Passive range of motion;Manual techniques    PT Next Visit Plan  nustep, gentle strengthening exercises, modalities PRN for pain relief.    PT Home Exercise Plan  see patient education section    Consulted and Agree with Plan of Care  Patient       Patient will benefit from skilled therapeutic intervention in order to improve the following deficits and impairments:  Decreased activity tolerance, Decreased mobility, Decreased strength, Difficulty walking, Postural dysfunction, Pain  Visit Diagnosis: 1. Acute right-sided low back pain without sciatica   2. Muscle weakness (generalized)        Problem List Patient Active Problem List   Diagnosis Date Noted  . Mild cognitive impairment 03/31/2017  . Alzheimer's dementia without behavioral disturbance ([place]) 12/02/2016  . Vitamin B12 deficiency 12/02/2016  . Essential hypertension 04/19/2016  . Mild episode of recurrent major depressive  disorder ([place]) 04/19/2016  . Memory loss 04/19/2016    Gabriela Eves, PT, DPT 01/20/2019, 3:13 PM  [facility] [address] [place], [place], [address] Phone: [phone]   Fax:  [phone]  Name: Susan Bennett MRN: [id number] Date of Birth: [birth year]

## 2019-01-20 NOTE — Progress Notes (Signed)
PT GIVEN CYANOCOBALAMIN INJ TOLERATED WELL

## 2019-01-27 ENCOUNTER — Encounter: Payer: Self-pay | Admitting: Orthopaedic Surgery

## 2019-01-27 ENCOUNTER — Ambulatory Visit: Payer: Medicare Other | Admitting: Physical Therapy

## 2019-01-27 ENCOUNTER — Ambulatory Visit (INDEPENDENT_AMBULATORY_CARE_PROVIDER_SITE_OTHER): Payer: Medicare Other | Admitting: Orthopaedic Surgery

## 2019-01-27 ENCOUNTER — Other Ambulatory Visit: Payer: Self-pay

## 2019-01-27 VITALS — BP 176/84 | HR 58 | Ht 64.0 in | Wt 109.0 lb

## 2019-01-27 DIAGNOSIS — G8929 Other chronic pain: Secondary | ICD-10-CM | POA: Diagnosis not present

## 2019-01-27 DIAGNOSIS — M545 Low back pain, unspecified: Secondary | ICD-10-CM | POA: Insufficient documentation

## 2019-01-27 DIAGNOSIS — M5441 Lumbago with sciatica, right side: Secondary | ICD-10-CM

## 2019-01-27 NOTE — Progress Notes (Signed)
Office Visit Note   Patient: Susan Bennett           Date of Birth: November 11, 1940           MRN: [id number] Visit Date: 01/27/2019              Requested by: Remus LofflerJones, Angel S, PA-C [address] [place],  [place] [id number] PCP: Remus LofflerJones, Angel S, PA-C   Assessment & Plan: Visit Diagnoses:  1. Chronic right-sided low back pain, unspecified whether sciatica present   2. Chronic right-sided low back pain with right-sided sciatica     Plan: MRI scan demonstrates significant stenosis both along the foramen and centrally at L3-4 and L4-5.  There is also a right foraminal disc protrusion contacting the descending right L5 nerve root with severe bilateral neural foraminal narrowing and moderate canal stenosis.  Long discussion with Mrs. Katrinka BlazingSmith and her daughter.  I think the next best step would be an epidural steroid injection.  [place] all of the above over 30 minutes 50% of the time in counseling.  We will schedule the injection and have her return shortly thereafter  Follow-Up Instructions: Return for After epidural steroid injection.   Orders:  Orders Placed This Encounter  Procedures  . Ambulatory referral to Physical Medicine Rehab   No orders of the defined types were placed in this encounter.     Procedures: No procedures performed   Clinical Data: No additional findings.   Subjective: Chief Complaint  Patient presents with  . Lower Back - Follow-up  Patient presents today for follow up on her right leg pain. She had an MRI on 01/20/2019 and is here today for those results. She has OTC pain medicines but states that they do not help.  No change in symptoms with predominantly back and right lower extremity pain the longer she stands or the further she walks  HPI  Review of Systems   Objective: Vital Signs: BP (!) 176/84   Pulse (!) 58   Ht 5\' 4"  (1.626 m)   Wt 109 lb (49.4 kg)   BMI 18.71 kg/m   Physical Exam Constitutional:      Appearance: She is well-developed.  Eyes:      Pupils: Pupils are equal, round, and reactive to light.  Pulmonary:     Effort: Pulmonary effort is normal.  Skin:    General: Skin is warm and dry.  Neurological:     Mental Status: She is alert and oriented to person, place, and time.  Psychiatric:        Behavior: Behavior normal.     Ortho Exam examined sitting.  Straight leg raise is negative.  Painless range of motion both hips.  No pain along either hip or thigh.  No significant percussible tenderness along the lumbar spine.  Some mild discomfort along the parasacral region on the right.  No masses  Specialty Comments:  No specialty comments available.  Imaging: No results found.   PMFS History: Patient Active Problem List   Diagnosis Date Noted  . Low back pain 01/27/2019  . Mild cognitive impairment 03/31/2017  . Alzheimer's dementia without behavioral disturbance (HCC) 12/02/2016  . Vitamin B12 deficiency 12/02/2016  . Essential hypertension 04/19/2016  . Mild episode of recurrent major depressive disorder (HCC) 04/19/2016  . Memory loss 04/19/2016   Past Medical History:  Diagnosis Date  . Chronic diarrhea   . Contact dermatitis and other eczema, due to unspecified cause   . Depressive disorder, not  elsewhere classified   . Osteoporosis, unspecified   . Other and unspecified hyperlipidemia   . Other B-complex deficiencies   . Unspecified essential hypertension   . Unspecified gastritis and gastroduodenitis without mention of hemorrhage   . Unspecified vitamin D deficiency     Family History  Problem Relation Age of Onset  . Healthy Sister   . Healthy Brother   . Throat cancer Brother   . Stroke Brother   . Heart disease Brother   . Throat cancer Maternal Aunt     Past Surgical History:  Procedure Laterality Date  . COLONOSCOPY     9 YEARS AGO   Social History   Occupational History  . Occupation: retired  . 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 Smoking status:  Former Smoker    Types: Cigarettes    Quit date: 02/20/2001    Years since quitting: 17.9  . Smokeless tobacco: Never Used  Substance and Sexual Activity  . Alcohol use: No  . Drug use: No  . Sexual activity: Not Currently

## 2019-02-17 ENCOUNTER — Encounter: Payer: Self-pay | Admitting: Physical Medicine and Rehabilitation

## 2019-02-17 ENCOUNTER — Ambulatory Visit: Payer: Self-pay

## 2019-02-17 ENCOUNTER — Ambulatory Visit (INDEPENDENT_AMBULATORY_CARE_PROVIDER_SITE_OTHER): Payer: Medicare Other | Admitting: Physical Medicine and Rehabilitation

## 2019-02-17 VITALS — BP 183/77 | HR 53

## 2019-02-17 DIAGNOSIS — M48062 Spinal stenosis, lumbar region with neurogenic claudication: Secondary | ICD-10-CM

## 2019-02-17 DIAGNOSIS — M5416 Radiculopathy, lumbar region: Secondary | ICD-10-CM

## 2019-02-17 MED ORDER — METHYLPREDNISOLONE ACETATE 80 MG/ML IJ SUSP
80.0000 mg | Freq: Once | INTRAMUSCULAR | Status: AC
  Administered 2019-02-17: 80 mg

## 2019-02-17 NOTE — Progress Notes (Signed)
Susan Bennett - 78 y.o. female MRN [id number]  Date of birth: December 03, 1940  Office Visit Note: Visit Date: 02/17/2019 PCP: Terald Sleeper, PA-C Referred by: Terald Sleeper, PA-C  Subjective: Chief Complaint  Patient presents with  . Lower Back - Pain  . Right Leg - Pain   HPI:  Susan Bennett is a 78 y.o. female who comes in today At the request of Dr. Joni Fears for diagnostic and hopefully therapeutic epidural injection.  Patient has multilevel spine issues and MRI reviewed today.  She has L3-4 disc protrusion on the right could affect the L4 nerve root in the lateral recess there is moderate narrowing centrally.  Similar findings at L4-5 just worse.  Again moderate stenosis more right lateral recess narrowing.  She is not done well with conservative care and she has had no prior surgery.  ROS Otherwise per HPI.  Assessment & Plan: Visit Diagnoses:  1. Spinal stenosis of lumbar region with neurogenic claudication   2. Lumbar radiculopathy     Plan: No additional findings.   Meds & Orders:  Meds ordered this encounter  Medications  . methylPREDNISolone acetate (DEPO-MEDROL) injection 80 mg    Orders Placed This Encounter  Procedures  . XR C-ARM NO REPORT  . Epidural Steroid injection    Follow-up: Return if symptoms worsen or fail to improve.   Procedures: No procedures performed  Lumbar Epidural Steroid Injection - Interlaminar Approach with Fluoroscopic Guidance  Patient: Susan Bennett      Date of Birth: [birth year] MRN: [id number] PCP: Terald Sleeper, PA-C      Visit Date: 02/17/2019   Universal Protocol:     Consent Given By: the patient  Position: PRONE  Additional Comments: Vital signs were monitored before and after the procedure. Patient was prepped and draped in the usual sterile fashion. The correct patient, procedure, and site was verified.   Injection Procedure Details:  Procedure Site One Meds Administered:  Meds ordered this encounter   Medications  . methylPREDNISolone acetate (DEPO-MEDROL) injection 80 mg     Laterality: Right  Location/Site:  L5-S1  Needle size: 20 G  Needle type: Tuohy  Needle Placement: Paramedian epidural  Findings:   -Comments: Excellent flow of contrast into the epidural space.  Procedure Details: Using a paramedian approach from the side mentioned above, the region overlying the inferior lamina was localized under fluoroscopic visualization and the soft tissues overlying this structure were infiltrated with 4 ml. of 1% Lidocaine without Epinephrine. The Tuohy needle was inserted into the epidural space using a paramedian approach.   The epidural space was localized using loss of resistance along with lateral and bi-planar fluoroscopic views.  After negative aspirate for air, blood, and CSF, a 2 ml. volume of Isovue-250 was injected into the epidural space and the flow of contrast was observed. Radiographs were obtained for documentation purposes.    The injectate was administered into the level noted above.   Additional Comments:  The patient tolerated the procedure well Dressing: 2 x 2 sterile gauze and Band-Aid    Post-procedure details: Patient was observed during the procedure. Post-procedure instructions were reviewed.  Patient left the clinic in stable condition.   Clinical History: MRI LUMBAR SPINE WITHOUT CONTRAST  TECHNIQUE: Multiplanar, multisequence MR imaging of the lumbar spine was performed. No intravenous contrast was administered.  COMPARISON:  None.  FINDINGS: Segmentation: There are 5 non-rib bearing lumbar type vertebral bodies with the last intervertebral disc space labeled as  L5-S1.  Alignment: There is a minimal retrolisthesis of L5 on S1. there is also a minimal retrolisthesis of L2 on L3.  Vertebrae: The vertebral body heights are well maintained. There is endplate reactive changes seen at L3-L4 and L5-S1.  Conus medullaris and cauda  equina: Conus extends to the L1 level. Conus and cauda equina appear normal.  Paraspinal and other soft tissues: There is a 1.7 cm cystic lesion adjacent to the iliac vasculature best seen on series 6, image 46. The remainder of the paraspinal soft tissues and visualized retroperitoneal structures are unremarkable. The sacroiliac joints are intact.  Disc levels:  T12-L1:  No significant canal or neural foraminal narrowing.  L1-L2: There is a broad-based disc bulge with a tiny focal central disc protrusion. There is facet arthrosis and ligamentum flavum hypertrophy. Mild effacement anterior thecal sac is seen.  L2-L3: There is a broad-based disc bulge with facet arthrosis and ligamentum flavum hypertrophy. Moderate right and mild left neural foraminal narrowing is seen. There is mild effacement of the anterior thecal sac.  L3-L4: There is a broad-based disc bulge with a right lateral recess/foraminal disc protrusion which contacts the descending right L4 nerve root. There is severe right and mild left neural foraminal narrowing. The central thecal sac is effaced measuring 7 mm in AP diameter.  L4-L5: There is a broad-based disc bulge with a right foraminal disc protrusion which contacts the descending right L5 nerve root. There is severe bilateral neural foraminal narrowing. The central thecal sac measures 6 mm in AP diameter.  L5-S1: There is a broad-based diffuse bulge which is eccentric to the left with facet arthrosis and ligamentum flavum hypertrophy. Severe left and moderate right neural foraminal narrowing are seen.  IMPRESSION: 1. Minimal retrolisthesis of L2 on L3 and L5 on S1. 2. Lumbar spine spondylosis most notable at L3-L4 with a right lateral recess/foraminal disc protrusion contacting the descending right L4 nerve root with severe right neural foraminal narrowing and mild to moderate central canal stenosis. Also at L4-L5 there is a right foraminal  disc protrusion contacting the descending right L5 nerve root with severe bilateral neural foraminal narrowing and moderate central canal stenosis.   Electronically Signed   By: Jonna ClarkBindu  Avutu M.D.   On: 01/21/2019 14:26     Objective:  VS:  HT:    WT:   BMI:     BP:(!) 183/77  HR:(!) 53bpm  TEMP: ( )  RESP:  Physical Exam  Ortho Exam Imaging: No results found.

## 2019-02-17 NOTE — Progress Notes (Signed)
 .  Numeric Pain Rating Scale and Functional Assessment Average Pain 8   In the last MONTH (on 0-10 scale) has pain interfered with the following?  1. General activity like being  able to carry out your everyday physical activities such as walking, climbing stairs, carrying groceries, or moving a chair?  Rating(7)   +Driver, -BT, -Dye Allergies.  

## 2019-02-23 ENCOUNTER — Other Ambulatory Visit: Payer: Self-pay | Admitting: Physician Assistant

## 2019-03-08 NOTE — Procedures (Signed)
Lumbar Epidural Steroid Injection - Interlaminar Approach with Fluoroscopic Guidance  Patient: Susan Bennett      Date of Birth: 11-19-1940 MRN: [id number] PCP: Terald Sleeper, PA-C      Visit Date: 02/17/2019   Universal Protocol:     Consent Given By: the patient  Position: PRONE  Additional Comments: Vital signs were monitored before and after the procedure. Patient was prepped and draped in the usual sterile fashion. The correct patient, procedure, and site was verified.   Injection Procedure Details:  Procedure Site One Meds Administered:  Meds ordered this encounter  Medications  . methylPREDNISolone acetate (DEPO-MEDROL) injection 80 mg     Laterality: Right  Location/Site:  L5-S1  Needle size: 20 G  Needle type: Tuohy  Needle Placement: Paramedian epidural  Findings:   -Comments: Excellent flow of contrast into the epidural space.  Procedure Details: Using a paramedian approach from the side mentioned above, the region overlying the inferior lamina was localized under fluoroscopic visualization and the soft tissues overlying this structure were infiltrated with 4 ml. of 1% Lidocaine without Epinephrine. The Tuohy needle was inserted into the epidural space using a paramedian approach.   The epidural space was localized using loss of resistance along with lateral and bi-planar fluoroscopic views.  After negative aspirate for air, blood, and CSF, a 2 ml. volume of Isovue-250 was injected into the epidural space and the flow of contrast was observed. Radiographs were obtained for documentation purposes.    The injectate was administered into the level noted above.   Additional Comments:  The patient tolerated the procedure well Dressing: 2 x 2 sterile gauze and Band-Aid    Post-procedure details: Patient was observed during the procedure. Post-procedure instructions were reviewed.  Patient left the clinic in stable condition.

## 2019-04-10 ENCOUNTER — Other Ambulatory Visit: Payer: Self-pay | Admitting: Physician Assistant

## 2019-04-15 ENCOUNTER — Telehealth: Payer: Self-pay | Admitting: Physical Medicine and Rehabilitation

## 2019-04-15 NOTE — Telephone Encounter (Signed)
Repeat  

## 2019-04-16 NOTE — Telephone Encounter (Signed)
Scheduled for 12/9 at 1400 with driver and no blood thinners.

## 2019-05-12 ENCOUNTER — Ambulatory Visit: Payer: Medicare Other

## 2019-05-12 ENCOUNTER — Other Ambulatory Visit: Payer: Self-pay

## 2019-05-12 ENCOUNTER — Encounter: Payer: Self-pay | Admitting: Physical Medicine and Rehabilitation

## 2019-05-12 ENCOUNTER — Ambulatory Visit (INDEPENDENT_AMBULATORY_CARE_PROVIDER_SITE_OTHER): Payer: Medicare Other | Admitting: Physical Medicine and Rehabilitation

## 2019-05-12 VITALS — BP 184/69 | HR 56

## 2019-05-12 DIAGNOSIS — M48062 Spinal stenosis, lumbar region with neurogenic claudication: Secondary | ICD-10-CM | POA: Diagnosis not present

## 2019-05-12 MED ORDER — METHYLPREDNISOLONE ACETATE 80 MG/ML IJ SUSP
80.0000 mg | Freq: Once | INTRAMUSCULAR | Status: AC
  Administered 2019-05-12: 80 mg

## 2019-05-12 NOTE — Progress Notes (Signed)
  Numeric Pain Rating Scale and Functional Assessment Average Pain (5)   In the last MONTH (on 0-10 scale) has pain interfered with the following?  1. General activity like being  able to carry out your everyday physical activities such as walking, climbing stairs, carrying groceries, or moving a chair?  Rating(5)   +Driver, -BT, -Dye Allergies.  

## 2019-05-12 NOTE — Procedures (Signed)
Lumbar Epidural Steroid Injection - Interlaminar Approach with Fluoroscopic Guidance  Patient: Susan Bennett      Date of Birth: Jan 23, 1941 MRN: [id number] PCP: Terald Sleeper, PA-C      Visit Date: 05/12/2019   Universal Protocol:     Consent Given By: the patient  Position: PRONE  Additional Comments: Vital signs were monitored before and after the procedure. Patient was prepped and draped in the usual sterile fashion. The correct patient, procedure, and site was verified.   Injection Procedure Details:  Procedure Site One Meds Administered:  Meds ordered this encounter  Medications  . methylPREDNISolone acetate (DEPO-MEDROL) injection 80 mg     Laterality: Right  Location/Site:  L5-S1  Needle size: 20 G  Needle type: Tuohy  Needle Placement: Paramedian epidural  Findings:   -Comments: Excellent flow of contrast into the epidural space.  Procedure Details: Using a paramedian approach from the side mentioned above, the region overlying the inferior lamina was localized under fluoroscopic visualization and the soft tissues overlying this structure were infiltrated with 4 ml. of 1% Lidocaine without Epinephrine. The Tuohy needle was inserted into the epidural space using a paramedian approach.   The epidural space was localized using loss of resistance along with lateral and bi-planar fluoroscopic views.  After negative aspirate for air, blood, and CSF, a 2 ml. volume of Isovue-250 was injected into the epidural space and the flow of contrast was observed. Radiographs were obtained for documentation purposes.    The injectate was administered into the level noted above.   Additional Comments:  The patient tolerated the procedure well Dressing: 2 x 2 sterile gauze and Band-Aid    Post-procedure details: Patient was observed during the procedure. Post-procedure instructions were reviewed.  Patient left the clinic in stable condition.

## 2019-05-13 NOTE — Progress Notes (Signed)
Susan Bennett - 78 y.o. female MRN [id number]  Date of birth: 1940-12-06  Office Visit Note: Visit Date: 05/12/2019 PCP: Terald Sleeper, PA-C Referred by: Terald Sleeper, PA-C  Subjective: Chief Complaint  Patient presents with  . Lower Back - Pain  . Right Thigh - Pain   HPI:  Susan Bennett is a 78 y.o. female who comes in today Repeat right L5-S1 interlaminar dural steroid injection for right radicular leg pain in setting of stenosis.  Prior injection gave her quite a bit of relief for many months.  Consideration will be given to transforaminal approach depending on how she does with this injection.  We did talk with her about multiple injections and longevity of the pain relief.  She does want to proceed with injection.  Her case is complicated by some dementia and depression.  Blood pressure today was noted is fairly high.  ROS Otherwise per HPI.  Assessment & Plan: Visit Diagnoses:  1. Spinal stenosis of lumbar region with neurogenic claudication     Plan: No additional findings.   Meds & Orders:  Meds ordered this encounter  Medications  . methylPREDNISolone acetate (DEPO-MEDROL) injection 80 mg    Orders Placed This Encounter  Procedures  . XR C-ARM NO REPORT  . Epidural Steroid injection    Follow-up: Return if symptoms worsen or fail to improve.   Procedures: No procedures performed  Lumbar Epidural Steroid Injection - Interlaminar Approach with Fluoroscopic Guidance  Patient: Susan Bennett      Date of Birth: 1940/06/28 MRN: [id number] PCP: Terald Sleeper, PA-C      Visit Date: 05/12/2019   Universal Protocol:     Consent Given By: the patient  Position: PRONE  Additional Comments: Vital signs were monitored before and after the procedure. Patient was prepped and draped in the usual sterile fashion. The correct patient, procedure, and site was verified.   Injection Procedure Details:  Procedure Site One Meds Administered:  Meds ordered this encounter   Medications  . methylPREDNISolone acetate (DEPO-MEDROL) injection 80 mg     Laterality: Right  Location/Site:  L5-S1  Needle size: 20 G  Needle type: Tuohy  Needle Placement: Paramedian epidural  Findings:   -Comments: Excellent flow of contrast into the epidural space.  Procedure Details: Using a paramedian approach from the side mentioned above, the region overlying the inferior lamina was localized under fluoroscopic visualization and the soft tissues overlying this structure were infiltrated with 4 ml. of 1% Lidocaine without Epinephrine. The Tuohy needle was inserted into the epidural space using a paramedian approach.   The epidural space was localized using loss of resistance along with lateral and bi-planar fluoroscopic views.  After negative aspirate for air, blood, and CSF, a 2 ml. volume of Isovue-250 was injected into the epidural space and the flow of contrast was observed. Radiographs were obtained for documentation purposes.    The injectate was administered into the level noted above.   Additional Comments:  The patient tolerated the procedure well Dressing: 2 x 2 sterile gauze and Band-Aid    Post-procedure details: Patient was observed during the procedure. Post-procedure instructions were reviewed.  Patient left the clinic in stable condition.     Clinical History: MRI LUMBAR SPINE WITHOUT CONTRAST  TECHNIQUE: Multiplanar, multisequence MR imaging of the lumbar spine was performed. No intravenous contrast was administered.  COMPARISON:  None.  FINDINGS: Segmentation: There are 5 non-rib bearing lumbar type vertebral bodies with the last intervertebral  disc space labeled as L5-S1.  Alignment: There is a minimal retrolisthesis of L5 on S1. there is also a minimal retrolisthesis of L2 on L3.  Vertebrae: The vertebral body heights are well maintained. There is endplate reactive changes seen at L3-L4 and L5-S1.  Conus medullaris and  cauda equina: Conus extends to the L1 level. Conus and cauda equina appear normal.  Paraspinal and other soft tissues: There is a 1.7 cm cystic lesion adjacent to the iliac vasculature best seen on series 6, image 46. The remainder of the paraspinal soft tissues and visualized retroperitoneal structures are unremarkable. The sacroiliac joints are intact.  Disc levels:  T12-L1:  No significant canal or neural foraminal narrowing.  L1-L2: There is a broad-based disc bulge with a tiny focal central disc protrusion. There is facet arthrosis and ligamentum flavum hypertrophy. Mild effacement anterior thecal sac is seen.  L2-L3: There is a broad-based disc bulge with facet arthrosis and ligamentum flavum hypertrophy. Moderate right and mild left neural foraminal narrowing is seen. There is mild effacement of the anterior thecal sac.  L3-L4: There is a broad-based disc bulge with a right lateral recess/foraminal disc protrusion which contacts the descending right L4 nerve root. There is severe right and mild left neural foraminal narrowing. The central thecal sac is effaced measuring 7 mm in AP diameter.  L4-L5: There is a broad-based disc bulge with a right foraminal disc protrusion which contacts the descending right L5 nerve root. There is severe bilateral neural foraminal narrowing. The central thecal sac measures 6 mm in AP diameter.  L5-S1: There is a broad-based diffuse bulge which is eccentric to the left with facet arthrosis and ligamentum flavum hypertrophy. Severe left and moderate right neural foraminal narrowing are seen.  IMPRESSION: 1. Minimal retrolisthesis of L2 on L3 and L5 on S1. 2. Lumbar spine spondylosis most notable at L3-L4 with a right lateral recess/foraminal disc protrusion contacting the descending right L4 nerve root with severe right neural foraminal narrowing and mild to moderate central canal stenosis. Also at L4-L5 there is a right  foraminal disc protrusion contacting the descending right L5 nerve root with severe bilateral neural foraminal narrowing and moderate central canal stenosis.   Electronically Signed   By: Jonna Clark M.D.   On: 01/21/2019 14:26     Objective:  VS:  HT:    WT:   BMI:     BP:(!) 184/69  HR:(!) 56bpm  TEMP: ( )  RESP:  Physical Exam  Ortho Exam Imaging: XR C-ARM NO REPORT  Result Date: 05/12/2019 Please see Notes tab for imaging impression.

## 2019-06-19 ENCOUNTER — Other Ambulatory Visit: Payer: Self-pay | Admitting: Physician Assistant

## 2019-07-19 ENCOUNTER — Other Ambulatory Visit: Payer: Self-pay | Admitting: Physician Assistant

## 2019-07-19 ENCOUNTER — Telehealth: Payer: Self-pay | Admitting: Physician Assistant

## 2019-07-19 DIAGNOSIS — I10 Essential (primary) hypertension: Secondary | ICD-10-CM

## 2019-07-19 NOTE — Telephone Encounter (Signed)
Already been done today.

## 2019-07-19 NOTE — Telephone Encounter (Signed)
What is the name of the medication? benazepril (LOTENSIN) 40 MG tablet    Have you contacted your pharmacy to request a refill? yes  Which pharmacy would you like this sent to? [facility]    Patient notified that their request is being sent to the clinical staff for review and that they should receive a call once it is complete. If they do not receive a call within 24 hours they can check with their pharmacy or our office.

## 2019-07-27 ENCOUNTER — Other Ambulatory Visit: Payer: Self-pay | Admitting: Physician Assistant

## 2019-08-06 ENCOUNTER — Other Ambulatory Visit: Payer: Self-pay | Admitting: Physician Assistant

## 2019-08-11 ENCOUNTER — Ambulatory Visit: Payer: Medicare Other | Admitting: Physician Assistant

## 2019-08-16 ENCOUNTER — Other Ambulatory Visit: Payer: Self-pay | Admitting: Physician Assistant

## 2019-08-16 DIAGNOSIS — I10 Essential (primary) hypertension: Secondary | ICD-10-CM

## 2019-08-18 ENCOUNTER — Telehealth: Payer: Self-pay | Admitting: Physician Assistant

## 2019-08-18 NOTE — Telephone Encounter (Signed)
Patient has been for 2 shots, but physical therapy. Just FYI if we up the namenda at visit just let daughter know if she needs to up

## 2019-08-18 NOTE — Telephone Encounter (Signed)
Please forward this information to whoever ends up seeing this patient, has an appointment for 08/20/2019

## 2019-08-18 NOTE — Telephone Encounter (Signed)
Thank you for the information.  Is the patient going to neurology anymore at all? I see that she is still taking Namenda which is for dementia, it can be increased.  With the patient accept this?

## 2019-08-18 NOTE — Telephone Encounter (Signed)
Patient's daughter, Lesly Dukes, may not be able to come with patient to her appointment so she wanted to make you aware of some things.  She is concerned that her mother's dementia is getting worse, she hasn't been eating as much as normal, she needs to get her B12 shot while at appointment, and they recently have lost a pet that they had for 16 years and patient is griefing.

## 2019-08-20 ENCOUNTER — Other Ambulatory Visit: Payer: Self-pay

## 2019-08-20 ENCOUNTER — Ambulatory Visit: Payer: Medicare Other | Admitting: Physician Assistant

## 2019-08-20 ENCOUNTER — Ambulatory Visit (INDEPENDENT_AMBULATORY_CARE_PROVIDER_SITE_OTHER): Payer: Medicare Other

## 2019-08-20 DIAGNOSIS — E538 Deficiency of other specified B group vitamins: Secondary | ICD-10-CM

## 2019-08-20 NOTE — Progress Notes (Signed)
Cyanocobalamin injection given to right deltoid.  Patient tolerated well. 

## 2019-08-25 ENCOUNTER — Telehealth: Payer: Self-pay | Admitting: Physician Assistant

## 2019-08-25 NOTE — Chronic Care Management (AMB) (Signed)
  Chronic Care Management   Note  08/25/2019 Name: Susan Bennett MRN: [id number] DOB: [birth year]  Susan Bennett is a 79 y.o. year old female who is a primary care patient of Terald Sleeper, PA-C. I reached out to Silvio Pate by phone today in response to a referral sent by Ms. Susan Bennett's health plan.     [name] was given information about Chronic Care Management services today including:  1. CCM service includes personalized support from designated clinical staff supervised by her physician, including individualized plan of care and coordination with other care providers 2. 24/7 contact phone numbers for assistance for urgent and routine care needs. 3. Service will only be billed when office clinical staff spend 20 minutes or more in a month to coordinate care. 4. Only one practitioner may furnish and bill the service in a calendar month. 5. The patient may stop CCM services at any time (effective at the end of the month) by phone call to the office staff. 6. The patient will be responsible for cost sharing (co-pay) of up to 20% of the service fee (after annual deductible is met).  Susan Bennett patients daughter verbally agreed to assistance and services provided by embedded care coordination/care management team today.  Follow up plan: Telephone appointment with care management team member scheduled for:03/03/2020.  [place], [place] [address] Direct Dial: [phone] [email or web address] Website: [email or web address]

## 2019-08-25 NOTE — Telephone Encounter (Signed)
Please forward to whoever she is scheduled with.

## 2019-08-26 NOTE — Telephone Encounter (Signed)
She is scheduled with Jones.

## 2019-08-26 NOTE — Telephone Encounter (Signed)
Daughter aware and states she will talk with patient and call back to schedule.

## 2019-08-26 NOTE — Telephone Encounter (Signed)
If you get a chance to reschedule her her and then send it to have her she sees.

## 2019-09-10 ENCOUNTER — Ambulatory Visit: Payer: Medicare Other | Admitting: Physician Assistant

## 2019-09-11 IMAGING — MR MRI LUMBAR SPINE WITHOUT CONTRAST
4 of 5 series · 14 of 48 positions shown · non-contrast
Comparison: None.

CLINICAL DATA: Pain in lower back, right leg a 2-3 weeks

EXAM:
MRI LUMBAR SPINE WITHOUT CONTRAST
TECHNIQUE: Multiplanar, multisequence MR imaging of the lumbar spine was
performed. No intravenous contrast was administered.

[Series 3: T2 · sagittal · 4.0mm · 0.68mm/px · 5 of 17 slices shown (1 of 2)]
[im 1/17]
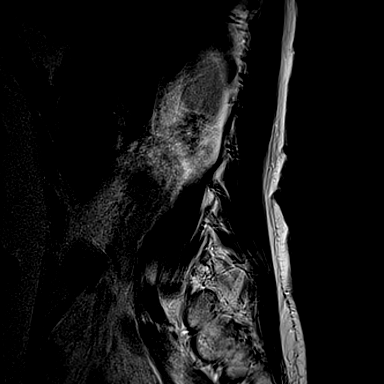
[im 4/17]
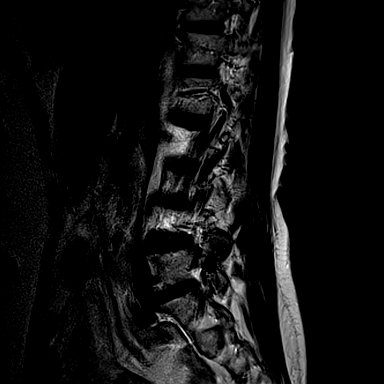
[im 7/17]
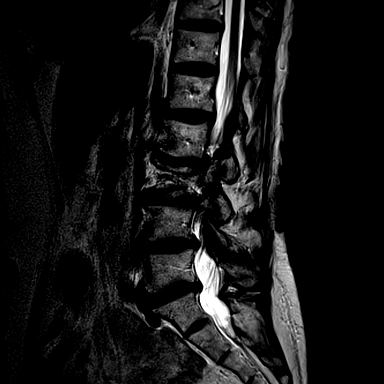
[im 10/17]
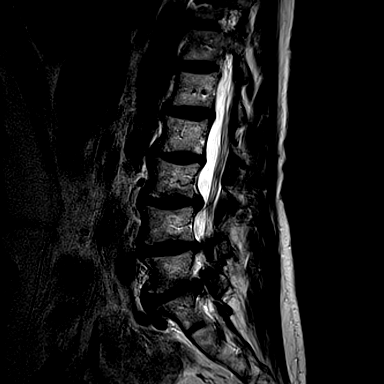
[im 17/17]
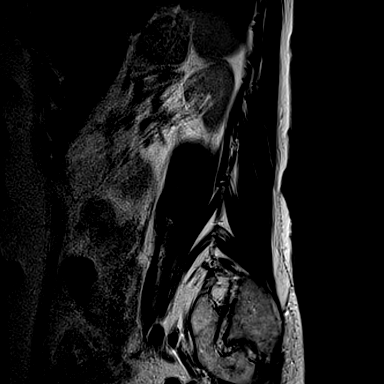

[Series 4: T1 · sagittal · 4.0mm · 0.34mm/px · 3 of 17 slices shown (1 of 2)]
[im 4/17]
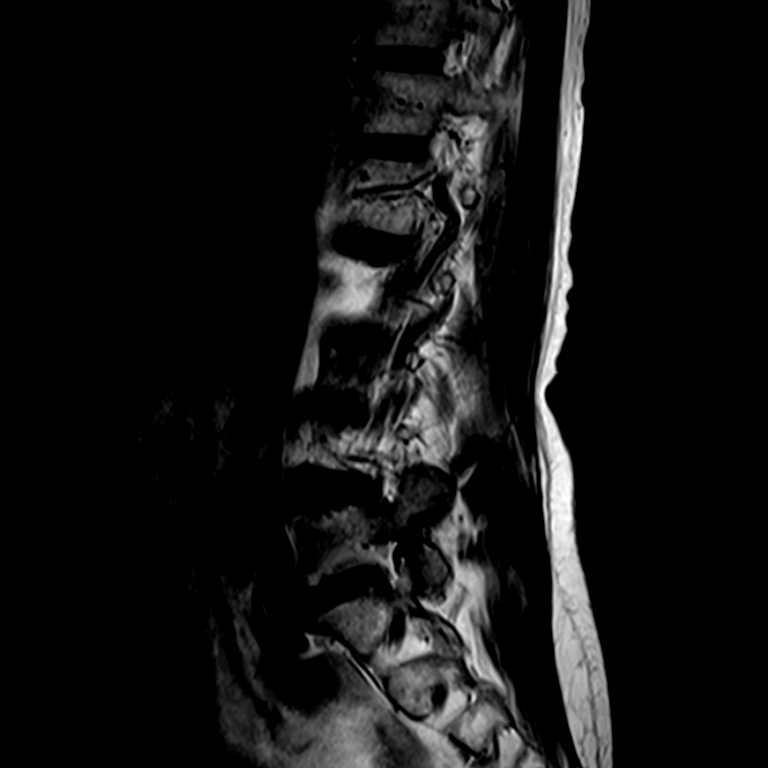
[im 10/17]
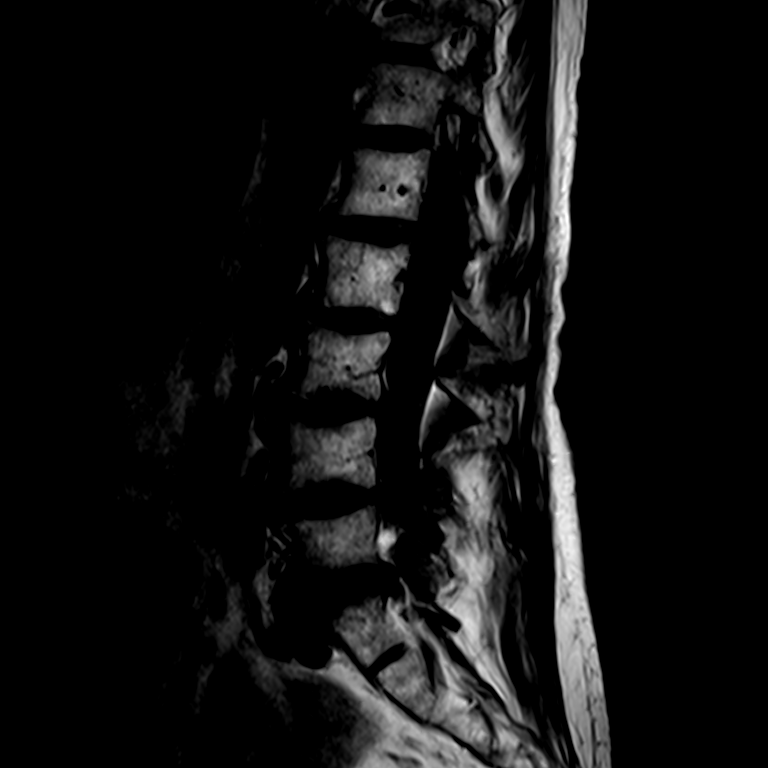
[im 17/17]
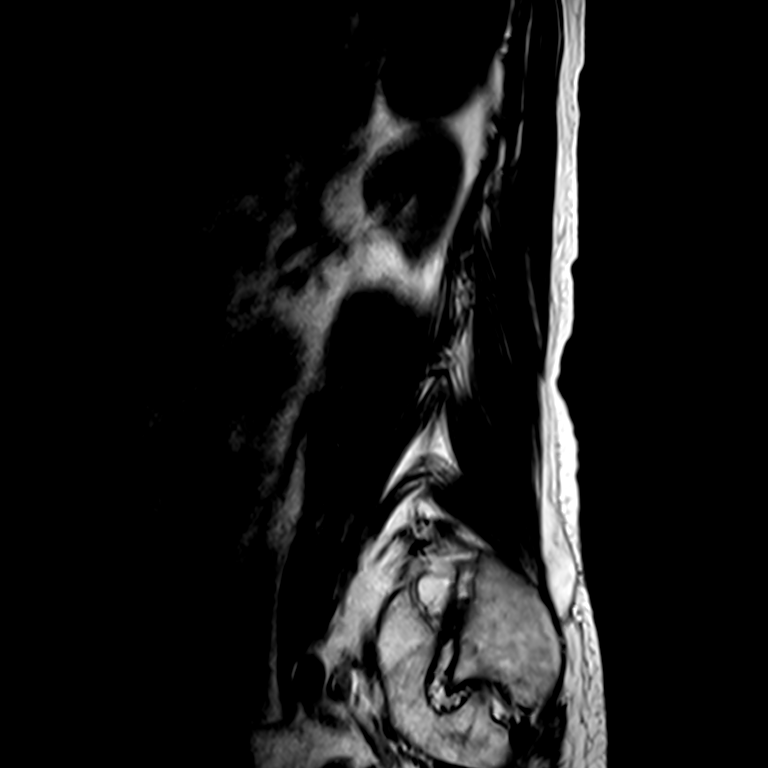

[Series 6: T2 · axial · 4.0mm · 0.27mm/px · z∈[-47,+105]mm · 3 of 46 slices shown (2 of 2)]
[im 7/46]
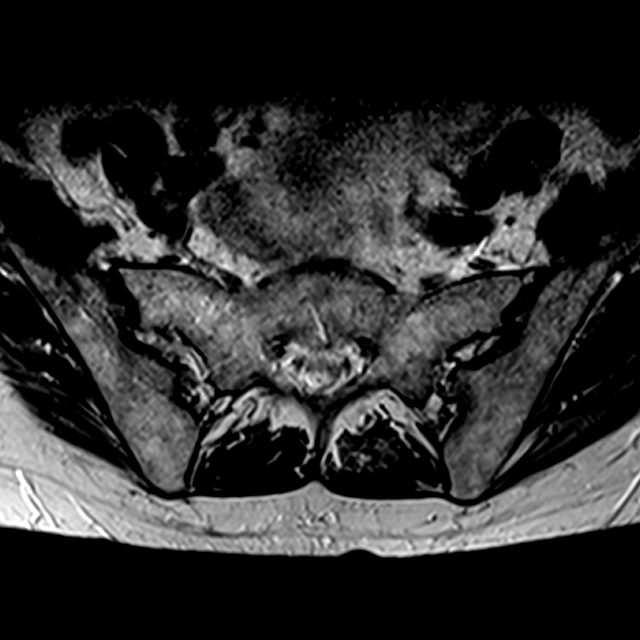
[im 23/46]
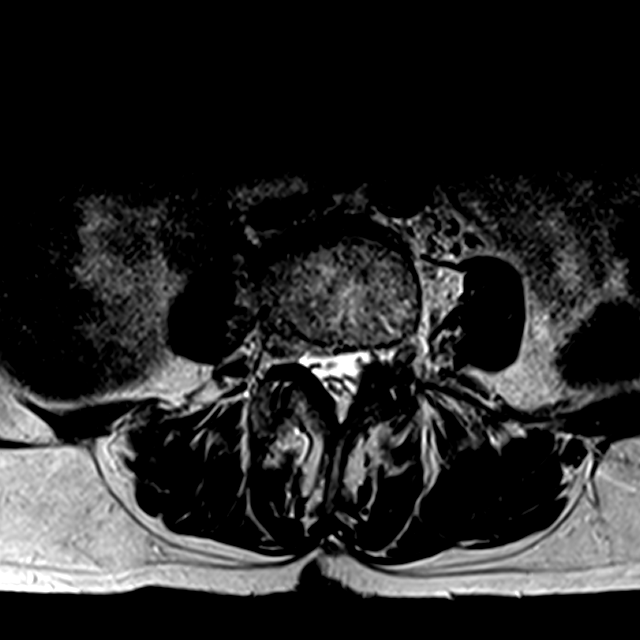
[im 39/46]
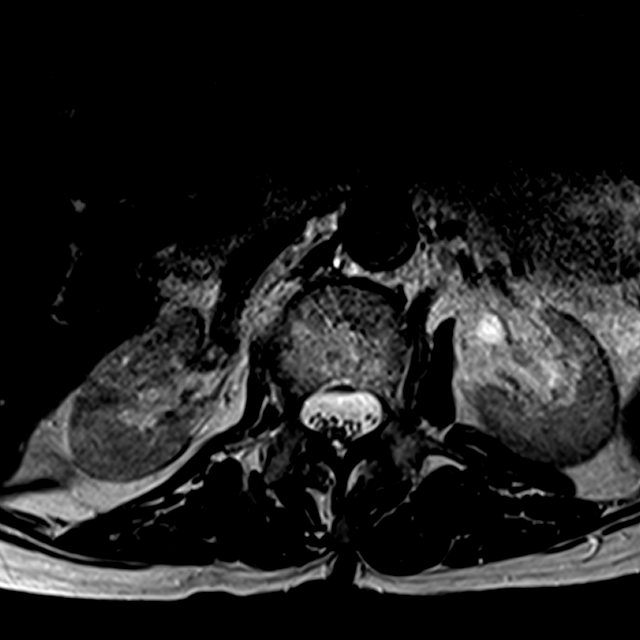

[Series 7: T1 · axial · 4.0mm · 0.27mm/px · z∈[-47,+105]mm · 3 of 46 slices shown (2 of 2)]
[im 7/46]
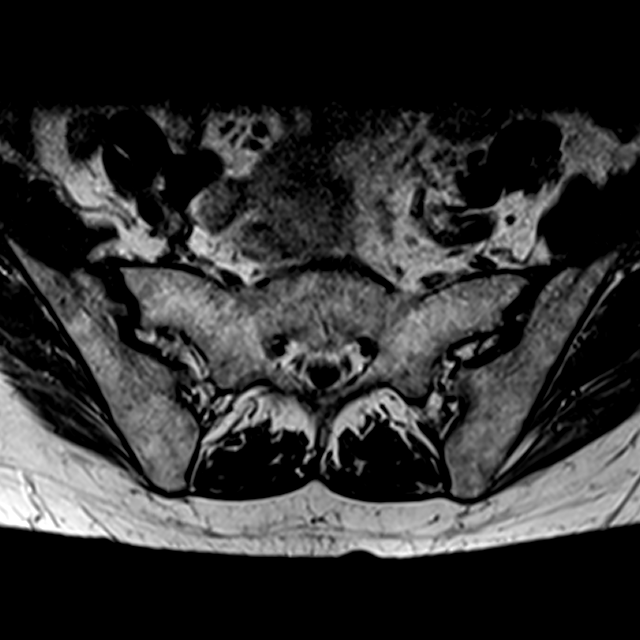
[im 23/46]
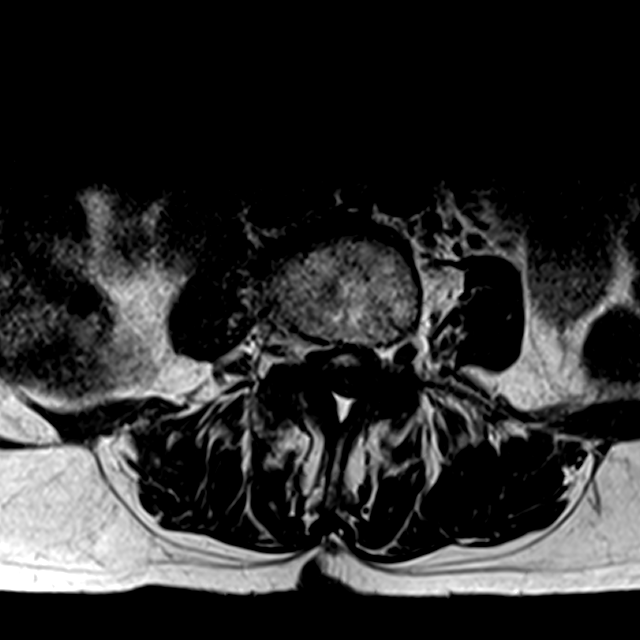
[im 39/46]
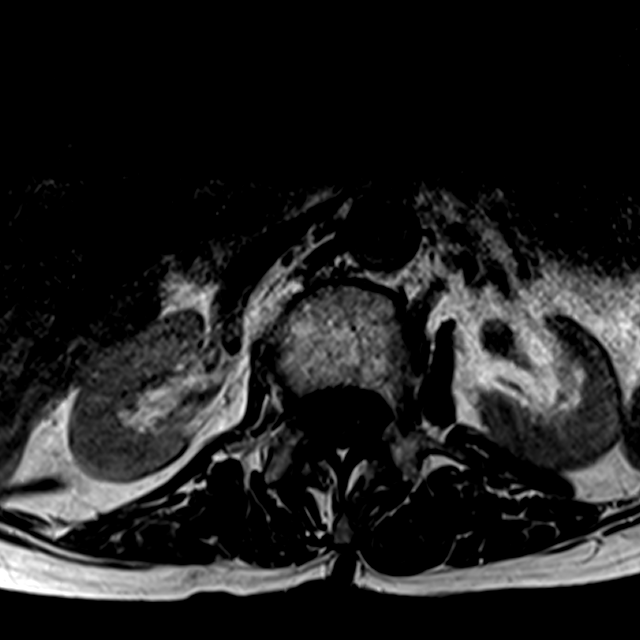

[14 of 48 positions shown; findings below may reference images not displayed]

FINDINGS: Segmentation: There are 5 non-rib bearing lumbar type vertebral
bodies with the last intervertebral disc space labeled as L5-S1.

Alignment: There is a minimal retrolisthesis of L5 on S1. there is
also a minimal retrolisthesis of L2 on L3.

Vertebrae: The vertebral body heights are well maintained. There is
endplate reactive changes seen at L3-L4 and L5-S1.

Conus medullaris and cauda equina: Conus extends to the L1 level.
Conus and cauda equina appear normal.

Paraspinal and other soft tissues: There is a 1.7 cm cystic lesion
adjacent to the iliac vasculature best seen on series 6, image 46.
The remainder of the paraspinal soft tissues and visualized
retroperitoneal structures are unremarkable. The sacroiliac joints
are intact.

Disc levels:

T12-L1:  No significant canal or neural foraminal narrowing.

L1-L2: There is a broad-based disc bulge with a tiny focal central
disc protrusion. There is facet arthrosis and ligamentum flavum
hypertrophy. Mild effacement anterior thecal sac is seen.

L2-L3: There is a broad-based disc bulge with facet arthrosis and
ligamentum flavum hypertrophy. Moderate right and mild left neural
foraminal narrowing is seen. There is mild effacement of the
anterior thecal sac.

L3-L4: There is a broad-based disc bulge with a right lateral
recess/foraminal disc protrusion which contacts the descending right
L4 nerve root. There is severe right and mild left neural foraminal
narrowing. The central thecal sac is effaced measuring 7 mm in AP
diameter.

L4-L5: There is a broad-based disc bulge with a right foraminal disc
protrusion which contacts the descending right L5 nerve root. There
is severe bilateral neural foraminal narrowing. The central thecal
sac measures 6 mm in AP diameter.

L5-S1: There is a broad-based diffuse bulge which is eccentric to
the left with facet arthrosis and ligamentum flavum hypertrophy.
Severe left and moderate right neural foraminal narrowing are seen.
IMPRESSION: 1. Minimal retrolisthesis of L2 on L3 and L5 on S1.
2. Lumbar spine spondylosis most notable at L3-L4 with a right
lateral recess/foraminal disc protrusion contacting the descending
right L4 nerve root with severe right neural foraminal narrowing and
mild to moderate central canal stenosis. Also at L4-L5 there is a
right foraminal disc protrusion contacting the descending right L5
nerve root with severe bilateral neural foraminal narrowing and
moderate central canal stenosis.

## 2019-09-15 ENCOUNTER — Encounter: Payer: Self-pay | Admitting: Family Medicine

## 2019-09-15 ENCOUNTER — Ambulatory Visit (INDEPENDENT_AMBULATORY_CARE_PROVIDER_SITE_OTHER): Payer: Medicare Other | Admitting: Family Medicine

## 2019-09-15 ENCOUNTER — Other Ambulatory Visit: Payer: Self-pay

## 2019-09-15 VITALS — BP 124/65 | HR 60 | Temp 97.2°F | Ht 64.0 in | Wt 108.6 lb

## 2019-09-15 DIAGNOSIS — G309 Alzheimer's disease, unspecified: Secondary | ICD-10-CM | POA: Diagnosis not present

## 2019-09-15 DIAGNOSIS — R630 Anorexia: Secondary | ICD-10-CM | POA: Diagnosis not present

## 2019-09-15 DIAGNOSIS — I10 Essential (primary) hypertension: Secondary | ICD-10-CM | POA: Diagnosis not present

## 2019-09-15 DIAGNOSIS — Z7689 Persons encountering health services in other specified circumstances: Secondary | ICD-10-CM | POA: Diagnosis not present

## 2019-09-15 DIAGNOSIS — R634 Abnormal weight loss: Secondary | ICD-10-CM

## 2019-09-15 DIAGNOSIS — F0280 Dementia in other diseases classified elsewhere without behavioral disturbance: Secondary | ICD-10-CM | POA: Diagnosis not present

## 2019-09-15 DIAGNOSIS — F4321 Adjustment disorder with depressed mood: Secondary | ICD-10-CM

## 2019-09-15 MED ORDER — BENAZEPRIL HCL 40 MG PO TABS: ORAL_TABLET | ORAL | 1 refills | Status: DC

## 2019-09-15 MED ORDER — DICLOFENAC SODIUM 50 MG PO TBEC: 50.0000 mg | DELAYED_RELEASE_TABLET | Freq: Two times a day (BID) | ORAL | 0 refills | Status: DC | PRN

## 2019-09-15 MED ORDER — SERTRALINE HCL 50 MG PO TABS: 75.0000 mg | ORAL_TABLET | Freq: Every day | ORAL | 2 refills | Status: DC

## 2019-09-15 MED ORDER — MEMANTINE HCL 10 MG PO TABS: ORAL_TABLET | ORAL | 2 refills | Status: DC

## 2019-09-15 MED ORDER — CLOTRIMAZOLE-BETAMETHASONE 1-0.05 % EX CREA: TOPICAL_CREAM | Freq: Two times a day (BID) | CUTANEOUS | 0 refills | Status: DC

## 2019-09-15 MED ORDER — AMLODIPINE BESYLATE 10 MG PO TABS: 10.0000 mg | ORAL_TABLET | Freq: Every day | ORAL | 1 refills | Status: DC

## 2019-09-15 NOTE — Progress Notes (Signed)
Subjective: CC: est care. Dementia, depression, weight loss PCP: Janora Norlander, DO Susan Bennett is a 79 y.o. female presenting to clinic today for:  1. Dementia Patient on Namenda 51m BID.  Her daughter is not present today but writes a note stating that patient's memory seems to be worse.  She wonders if a dose increase is needed.  2.  Depression Patient is on Zoloft 50 mg daily.  She tolerates this without difficulty but recently lost her dog of 16 years, LMendel Ryder shihzu.  She does report some down feelings about this.  Her daughter reports that her appetite has been down and she subsequently had some weight loss.  When patients daily food intake was reviewed she notes that she typically eats a piece of toast and perhaps an egg, banana sandwich at lunch and, a full dinner at supper (which is typically provided by one of her family members).  She also has a daily boost drink.  She drinks several sodas per day and rarely drinks water because she does not like the taste.  She is accompanied today by her niece who reiterates that she definitely drinks a lot of soda and does not have a strong appetite despite what she reports today.  She is also observed some weight loss.   ROS: Per HPI  No Known Allergies Past Medical History:  Diagnosis Date  . Chronic diarrhea   . Contact dermatitis and other eczema, due to unspecified cause   . Depressive disorder, not elsewhere classified   . Osteoporosis, unspecified   . Other and unspecified hyperlipidemia   . Other B-complex deficiencies   . Unspecified essential hypertension   . Unspecified gastritis and gastroduodenitis without mention of hemorrhage   . Unspecified vitamin D deficiency     Current Outpatient Medications:  .  amLODipine (NORVASC) 10 MG tablet, Take 1 tablet (10 mg total) by mouth daily., Disp: 90 tablet, Rfl: 1 .  benazepril (LOTENSIN) 40 MG tablet, TAKE 1 TABLET BY MOUTH ONCE DAILY . APPOINTMENT REQUIRED FOR FUTURE  REFILLS, Disp: 30 tablet, Rfl: 0 .  clotrimazole-betamethasone (LOTRISONE) cream, APPLY TOPICALLY TWICE DAILY, Disp: 45 g, Rfl: 0 .  diclofenac (VOLTAREN) 50 MG EC tablet, Take 1 tablet by mouth twice daily, Disp: 60 tablet, Rfl: 0 .  memantine (NAMENDA) 10 MG tablet, TAKE 1/2 (ONE-HALF) TABLET BY MOUTH TWICE DAILY, Disp: 60 tablet, Rfl: 11 .  sertraline (ZOLOFT) 50 MG tablet, Take 1 tablet by mouth once daily, Disp: 30 tablet, Rfl: 0  Current Facility-Administered Medications:  .  cyanocobalamin ((VITAMIN B-12)) injection 1,000 mcg, 1,000 mcg, Intramuscular, Q30 days, Jones, Angel S, PA-C, 1,000 mcg at 08/20/19 1538 Social History   Socioeconomic History  . Marital status: Divorced    Spouse name: Not on file  . Number of children: 1  . Years of education: Not on file  . Highest education level: 12th grade  Occupational History  . Occupation: retired  . 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 Smoking status: Former Smoker    Types: Cigarettes    Quit date: 02/20/2001    Years since quitting: 18.5  . Smokeless tobacco: Never Used  Substance and Sexual Activity  . Alcohol use: No  . Drug use: No  . Sexual activity: Not Currently  Other Topics Concern  . Not on file  Social History Narrative   Lives alone in 1 story home with full basement   Has 1 adult child  Highest level of education: high school diploma   Retired Medical illustrator   Social Determinants of Radio broadcast assistant Strain:   . Difficulty of Paying Living Expenses:   Food Insecurity:   . Worried About Charity fundraiser in the Last Year:   . Arboriculturist in the Last Year:   Transportation Needs:   . Film/video editor (Medical):   Marland Kitchen Lack of Transportation (Non-Medical):   Physical Activity:   . Days of Exercise per Week:   . Minutes of Exercise per Session:   Stress:   . Feeling of Stress :   Social Connections:   . Frequency of Communication with Friends and  Family:   . Frequency of Social Gatherings with Friends and Family:   . Attends Religious Services:   . Active Member of Clubs or Organizations:   . Attends Archivist Meetings:   Marland Kitchen Marital Status:   Intimate Partner Violence:   . Fear of Current or Ex-Partner:   . Emotionally Abused:   Marland Kitchen Physically Abused:   . Sexually Abused:    Family History  Problem Relation Age of Onset  . Healthy Sister   . Healthy Brother   . Throat cancer Brother   . Stroke Brother   . Heart disease Brother   . Throat cancer Maternal Aunt     Objective: Office vital signs reviewed. BP 124/65   Pulse 60   Temp (!) 97.2 F (36.2 C)   Ht _0  (1.626 m)   Wt 108 lb 9.6 oz (49.3 kg)   SpO2 95%   BMI 18.64 kg/m   Physical Examination:  General: Awake, alert, well appearing elderly female, No acute distress HEENT: Normal; sclera white.  No exophthalmos.  No visible goiter Cardio: regular rate and rhythm, S1S2 heard, no murmurs appreciated Pulm: clear to auscultation bilaterally, no wheezes, rhonchi or rales; normal work of breathing on room air Extremities: warm, well perfused, No edema, cyanosis or clubbing; +2 pulses bilaterally MSK: slow gait and hunched station; ambulates independently Skin: dry; intact; no rashes or lesions Neuro: no tremor Psych: Mood stable, speech normal, affect appropriate, good eye contact, does not appear to be responding to internal stimuli  MMSE - Mini Mental State Exam 09/15/2019 01/09/2018 04/19/2016  Orientation to time _1 Orientation to Place _2 Registration _3 Attention/ Calculation _4 Recall 0 0 1  Language- name 2 objects _5 Language- repeat 1 0 1  Language- follow 3 step command _6 Language- read & follow direction _7 Write a sentence _8 Copy design 0 1 1  Total score _9 Assessment/ Plan: 79 y.o. female   1. Grief reaction Going to increase her dose of Zoloft to 75 mg daily.  I wonder if some  depressive symptoms that she is having from her dog passing away might be impacting her appetite and ability to maintain weight.  I reinforced need for increased hydration, protein.  I would like to see her back in about 4 to 6 weeks for recheck of mood and weight - sertraline (ZOLOFT) 50 MG tablet; Take 1.5 tablets (75 mg total) by mouth daily.  Dispense: 45 tablet; Refill: 2  2. Alzheimer's dementia without behavioral disturbance, unspecified timing of dementia onset ([place]) I have also increased her Namenda.  We will plan to separate the changes  in medications by 3 days in case there is an intolerance.  She will see me back in about 6 weeks, sooner if needed.  Of note MMSE was stable from previous check in 2019 - CMP14+EGFR - Vitamin B12 - TSH - CBC - memantine (NAMENDA) 10 MG tablet; TAKE 1/2 (ONE-HALF) TABLET BY MOUTH QAM and 1 tablet every evening.  Dispense: 45 tablet; Refill: 2 - sertraline (ZOLOFT) 50 MG tablet; Take 1.5 tablets (75 mg total) by mouth daily.  Dispense: 45 tablet; Refill: 2  3. Establishing care with new doctor, encounter for  4. Unexplained weight loss Patient with unexpected weight loss.  I again think this may be related to the above.  Since July she is had about a 3 pound weight loss.  5. Decreased appetite  6. Essential hypertension Controlled.  Continue current regimen.  Monitor closely.  If continues to have labile blood pressures may need to consider 24-hour blood pressure monitoring. - amLODipine (NORVASC) 10 MG tablet; Take 1 tablet (10 mg total) by mouth daily.  Dispense: 90 tablet; Refill: 1 - benazepril (LOTENSIN) 40 MG tablet; TAKE 1 TABLET BY MOUTH ONCE DAILY for blood pressure  Dispense: 90 tablet; Refill: 1   Orders Placed This Encounter  Procedures  . CMP14+EGFR  . Vitamin B12  . TSH  . CBC   No orders of the defined types were placed in this encounter.    Janora Norlander, DO [place] [phone]

## 2019-09-15 NOTE — Patient Instructions (Addendum)
Added a few labs since we have not checked anything in a couple of years.  I am going to make sure that we do not have any unusual electrolyte deficiencies or abnormalities.  Your sodium level slightly elevated a couple years ago and it looks like we lost you to follow-up.  I am also going to check a vitamin B-12 level and a thyroid-stimulating hormone level.  Sometimes these can impact memory as well as mood.  Medication changes today:  Namenda (memantine): take 1/2 tablet every morning and 1 tablet every evening  Sertraline (Zoloft): take 1.5 tablets daily.  We talked about the risk of diclofenac with the Zoloft.  Together these can increase risk of stomach bleeds.  Please take the diclofenac very sparingly and always with a full meal.  Work on reducing soda intake and increase water intake.  Continue boost every day.  Continue to monitor the blood pressure closely.  If you start seeing regular increases in blood pressure, please let me know.  We can consider adjusting her blood pressure medications at that time but hesitate to do that when she has a normal blood pressure reading today.  Things that can impact blood pressure include excessive salt intake, anxiety, stress and physical activity.

## 2019-09-16 LAB — CMP14+EGFR
ALT: 6 IU/L (ref 0–32)
AST: 11 IU/L (ref 0–40)
Albumin/Globulin Ratio: 2.1 (ref 1.2–2.2)
Albumin: 3.9 g/dL (ref 3.7–4.7)
Alkaline Phosphatase: 100 IU/L (ref 39–117)
BUN/Creatinine Ratio: 12 (ref 12–28)
BUN: 12 mg/dL (ref 8–27)
Bilirubin Total: <0.2 mg/dL (ref 0.0–1.2)
CO2: 26 mmol/L (ref 20–29)
Calcium: 9 mg/dL (ref 8.7–10.3)
Chloride: 104 mmol/L (ref 96–106)
Creatinine, Ser: 1.02 mg/dL — ABNORMAL HIGH (ref 0.57–1.00)
GFR calc Af Amer: 60 mL/min/{1.73_m2} (ref >59)
GFR calc non Af Amer: 52 mL/min/{1.73_m2} — ABNORMAL LOW (ref >59)
Globulin, Total: 1.9 g/dL (ref 1.5–4.5)
Glucose: 131 mg/dL — ABNORMAL HIGH (ref 65–99)
Potassium: 3.6 mmol/L (ref 3.5–5.2)
Sodium: 144 mmol/L (ref 134–144)
Total Protein: 5.8 g/dL — ABNORMAL LOW (ref 6.0–8.5)

## 2019-09-16 LAB — CBC
Hematocrit: 37.1 % (ref 34.0–46.6)
Hemoglobin: 12.5 g/dL (ref 11.1–15.9)
MCH: 31.9 pg (ref 26.6–33.0)
MCHC: 33.7 g/dL (ref 31.5–35.7)
MCV: 95 fL (ref 79–97)
Platelets: 187 10*3/uL (ref 150–450)
RBC: 3.92 x10E6/uL (ref 3.77–5.28)
RDW: 12.9 % (ref 11.7–15.4)
WBC: 6.5 10*3/uL (ref 3.4–10.8)

## 2019-09-16 LAB — VITAMIN B12: Vitamin B-12: 383 pg/mL (ref 232–1245)

## 2019-09-16 LAB — TSH: TSH: 1.84 u[IU]/mL (ref 0.450–4.500)

## 2019-12-15 ENCOUNTER — Encounter: Payer: Self-pay | Admitting: Family Medicine

## 2019-12-15 ENCOUNTER — Other Ambulatory Visit: Payer: Self-pay

## 2019-12-15 ENCOUNTER — Ambulatory Visit (INDEPENDENT_AMBULATORY_CARE_PROVIDER_SITE_OTHER): Payer: Medicare Other | Admitting: Family Medicine

## 2019-12-15 VITALS — BP 144/62 | HR 56 | Temp 97.8°F | Ht 64.0 in | Wt 107.0 lb

## 2019-12-15 DIAGNOSIS — F0280 Dementia in other diseases classified elsewhere without behavioral disturbance: Secondary | ICD-10-CM

## 2019-12-15 DIAGNOSIS — M25551 Pain in right hip: Secondary | ICD-10-CM | POA: Diagnosis not present

## 2019-12-15 DIAGNOSIS — M545 Low back pain, unspecified: Secondary | ICD-10-CM

## 2019-12-15 DIAGNOSIS — I10 Essential (primary) hypertension: Secondary | ICD-10-CM | POA: Diagnosis not present

## 2019-12-15 DIAGNOSIS — E538 Deficiency of other specified B group vitamins: Secondary | ICD-10-CM

## 2019-12-15 DIAGNOSIS — G309 Alzheimer's disease, unspecified: Secondary | ICD-10-CM

## 2019-12-15 DIAGNOSIS — G8929 Other chronic pain: Secondary | ICD-10-CM

## 2019-12-15 MED ORDER — LIDOCAINE 5 % EX PTCH: 1.0000 | MEDICATED_PATCH | CUTANEOUS | 3 refills | Status: DC

## 2019-12-15 NOTE — Progress Notes (Signed)
Subjective: CC: Follow-up memory, pain, B12 deficiency PCP: Susan Ip, DO ZOX:Susan Bennett is a 79 y.o. female who is brought to the office today by caregiver.  She is presenting to clinic today for:  1.  B12 deficiency/memory problems Patient denies any sensation changes in the feet or hands.  No balance issues.  She has ongoing memory problems.  She brings to me a note written by her daughter, Susan Bennett, who notes that her memory seems to be slightly worse despite use of Namenda.  However, she does not go into detail as to what those memory deficits are.  2.  Right hip pain Patient reports ongoing right-sided hip and back pain.  This is refractory to OTC analgesics.  Her daughter wants to know if she should use IcyHot or Salonpas patches.  She certainly thinks that she needs home physical therapy.  Patient is unable to ambulate for long distances due to pain and needs assistance to travel outside of the home.   ROS: Per HPI  No Known Allergies Past Medical History:  Diagnosis Date  . Chronic diarrhea   . Contact dermatitis and other eczema, due to unspecified cause   . Depressive disorder, not elsewhere classified   . Osteoporosis, unspecified   . Other and unspecified hyperlipidemia   . Other B-complex deficiencies   . Unspecified essential hypertension   . Unspecified gastritis and gastroduodenitis without mention of hemorrhage   . Unspecified vitamin D deficiency     Current Outpatient Medications:  .  amLODipine (NORVASC) 10 MG tablet, Take 1 tablet (10 mg total) by mouth daily., Disp: 90 tablet, Rfl: 1 .  benazepril (LOTENSIN) 40 MG tablet, TAKE 1 TABLET BY MOUTH ONCE DAILY for blood pressure, Disp: 90 tablet, Rfl: 1 .  clotrimazole-betamethasone (LOTRISONE) cream, Apply topically 2 (two) times daily. x7 days as needed for groin rash, Disp: 45 g, Rfl: 0 .  diclofenac (VOLTAREN) 50 MG EC tablet, Take 1 tablet (50 mg total) by mouth 2 (two) times daily as needed for  moderate pain., Disp: 60 tablet, Rfl: 0 .  memantine (NAMENDA) 10 MG tablet, TAKE 1/2 (ONE-HALF) TABLET BY MOUTH QAM and 1 tablet every evening., Disp: 45 tablet, Rfl: 2 .  sertraline (ZOLOFT) 50 MG tablet, Take 1.5 tablets (75 mg total) by mouth daily., Disp: 45 tablet, Rfl: 2  Current Facility-Administered Medications:  .  cyanocobalamin ((VITAMIN B-12)) injection 1,000 mcg, 1,000 mcg, Intramuscular, Q30 days, Susan Feeler S, PA-C, 1,000 mcg at 08/20/19 1538 Social History   Socioeconomic History  . Marital status: Divorced    Spouse name: Not on file  . Number of children: 1  . Years of education: Not on file  . Highest education level: 12th grade  Occupational History  . Occupation: retired  . 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 Smoking status: Former Smoker    Types: Cigarettes    Quit date: 02/20/2001    Years since quitting: 18.8  . Smokeless tobacco: Never Used  Vaping Use  . Vaping Use: Never used  Substance and Sexual Activity  . Alcohol use: No  . Drug use: No  . Sexual activity: Not Currently  Other Topics Concern  . Not on file  Social History Narrative   Lives alone in 1 story home with full basement   Has 1 adult child   Highest level of education: high school diploma   Retired Advertising account planner   Social Determinants of Health  Financial Resource Strain:   . Difficulty of Paying Living Expenses:   Food Insecurity:   . Worried About Programme researcher, broadcasting/film/video in the Last Year:   . Barista in the Last Year:   Transportation Needs:   . Freight forwarder (Medical):   Marland Kitchen Lack of Transportation (Non-Medical):   Physical Activity:   . Days of Exercise per Week:   . Minutes of Exercise per Session:   Stress:   . Feeling of Stress :   Social Connections:   . Frequency of Communication with Friends and Family:   . Frequency of Social Gatherings with Friends and Family:   . Attends Religious Services:   . Active Member of  Clubs or Organizations:   . Attends Banker Meetings:   Marland Kitchen Marital Status:   Intimate Partner Violence:   . Fear of Current or Ex-Partner:   . Emotionally Abused:   Marland Kitchen Physically Abused:   . Sexually Abused:    Family History  Problem Relation Age of Onset  . Healthy Sister   . Healthy Brother   . Throat cancer Brother   . Stroke Brother   . Heart disease Brother   . Throat cancer Maternal Aunt     Objective: Office vital signs reviewed. BP (!) 144/62   Pulse (!) 56   Temp 97.8 F (36.6 C) (Temporal)   Ht 5\' 4"  (1.626 m)   Wt 107 lb (48.5 kg)   SpO2 97%   BMI 18.37 kg/m   Physical Examination:  General: Awake, alert, thin, elderly female, No acute distress HEENT: Normal, sclera white, MMM Cardio: regular rate and rhythm, S1S2 heard, no murmurs appreciated Pulm: clear to auscultation bilaterally, no wheezes, rhonchi or rales; normal work of breathing on room air Extremities: warm, well perfused, No edema, cyanosis or clubbing; +2 pulses bilaterally MSK: increased kyphotic curve at thoracics. mildly antalgic gait and station; ambulates with some assistance to stabilize.  Lumbar spine: no midline TTP, +paraspinal TTP over SI and lumbosacral areas on right. Skin: dry; intact; no rashes or lesions Neuro: light touch sensation in tact.  Depression screen [facility] 2/9 12/15/2019 09/15/2019 12/28/2018  Decreased Interest 1 0 0  Down, Depressed, Hopeless 1 0 0  PHQ - 2 Score 2 0 0  Altered sleeping 0 0 -  Tired, decreased energy 1 1 -  Change in appetite 0 0 -  Feeling bad or failure about yourself  0 0 -  Trouble concentrating 1 0 -  Moving slowly or fidgety/restless 0 0 -  Suicidal thoughts 0 0 -  PHQ-9 Score 4 1 -  Difficult doing work/chores Not difficult at all Not difficult at all -   No flowsheet data found.  Assessment/ Plan: 79 y.o. female   1. Alzheimer'Bennett dementia without behavioral disturbance, unspecified timing of dementia onset ([facility]) I will reach  out to her daughter 89 today to figure out exactly what changes she has seen.  Would consider adding Aricept if appropriate.  Currently on max dose Namenda - Ambulatory referral to Home Health  2. Essential hypertension Controlled for age  62. Vitamin B12 deficiency Vitamin B12 administered today  4. Chronic right-sided low back pain without sciatica Referral to home health placed.  She certainly would benefit from strengthening exercises.  I have prescribed lidocaine patches for the patient.  Okay to use topical analgesic of choice if desires.  Though would avoid products containing lidocaine and should she use the Lidoderm patches. - Ambulatory  referral to Home Health - lidocaine (LIDODERM) 5 %; Place 1 patch onto the skin daily. Remove & Discard patch within 12 hours or as directed by MD  Dispense: 30 patch; Refill: 3  5. Hip pain, chronic, right Degenerative.  Referral to home health - Ambulatory referral to Home Health - lidocaine (LIDODERM) 5 %; Place 1 patch onto the skin daily. Remove & Discard patch within 12 hours or as directed by MD  Dispense: 30 patch; Refill: 3   No orders of the defined types were placed in this encounter.  No orders of the defined types were placed in this encounter.    Susan Ip, DO [facility] [phone]

## 2019-12-15 NOTE — Patient Instructions (Signed)
Referral to home health has been made.  If you do not hear anything within the next week, please contact us.  Please note that there is a shortage of in home therapist/nurses.  This may delay her care.  Should you choose, I would be happy to place referral to physical therapist next door.  Salonpas are fine to use.  Tiger balm and heat should also help the low back.  I am going to send in lidocaine patches to see if perhaps this might be helpful.    We will plan for labs next visit.

## 2019-12-16 ENCOUNTER — Telehealth: Payer: Self-pay | Admitting: Family Medicine

## 2019-12-16 ENCOUNTER — Telehealth: Payer: Self-pay | Admitting: *Deleted

## 2019-12-16 MED ORDER — DONEPEZIL HCL 5 MG PO TABS: 5.0000 mg | ORAL_TABLET | Freq: Every day | ORAL | 0 refills | Status: DC

## 2019-12-16 NOTE — Telephone Encounter (Signed)
Spoke with daughter, Reece Levy.  Forgetfulness is getting worse. She can't even remember when she has eaten.  Daughter notes that patient would not go to therapy next door.  She prefers [facility] for [facility] if possible.

## 2019-12-16 NOTE — Telephone Encounter (Signed)
No visual or auditory hallucinations.  No unsafe issues at home. No combativeness but she is somewhat resistant to take medication.  Aricept 5mg  added

## 2019-12-16 NOTE — Telephone Encounter (Signed)
Community message sent to Sarah Napier w/ [facility] 

## 2019-12-16 NOTE — Telephone Encounter (Signed)
Approved today PA Case: [id number], Status: Approved, Coverage Starts on: 06/04/2019 12:00:00 AM, Coverage Ends on: 06/02/2020  Pharm aware

## 2019-12-16 NOTE — Telephone Encounter (Signed)
PA for lidocaine patches 5% came in today - sent to plan  Pt does have MCR   Key: [id number] - PA Case ID: [id number] - Rx #: [id number]

## 2019-12-21 DIAGNOSIS — Z87891 Personal history of nicotine dependence: Secondary | ICD-10-CM | POA: Diagnosis not present

## 2019-12-21 DIAGNOSIS — F329 Major depressive disorder, single episode, unspecified: Secondary | ICD-10-CM | POA: Diagnosis not present

## 2019-12-21 DIAGNOSIS — M1611 Unilateral primary osteoarthritis, right hip: Secondary | ICD-10-CM | POA: Diagnosis not present

## 2019-12-21 DIAGNOSIS — E538 Deficiency of other specified B group vitamins: Secondary | ICD-10-CM | POA: Diagnosis not present

## 2019-12-21 DIAGNOSIS — G8929 Other chronic pain: Secondary | ICD-10-CM | POA: Diagnosis not present

## 2019-12-21 DIAGNOSIS — G309 Alzheimer's disease, unspecified: Secondary | ICD-10-CM | POA: Diagnosis not present

## 2019-12-21 DIAGNOSIS — M810 Age-related osteoporosis without current pathological fracture: Secondary | ICD-10-CM | POA: Diagnosis not present

## 2019-12-21 DIAGNOSIS — K2990 Gastroduodenitis, unspecified, without bleeding: Secondary | ICD-10-CM | POA: Diagnosis not present

## 2019-12-21 DIAGNOSIS — E785 Hyperlipidemia, unspecified: Secondary | ICD-10-CM | POA: Diagnosis not present

## 2019-12-21 DIAGNOSIS — I10 Essential (primary) hypertension: Secondary | ICD-10-CM | POA: Diagnosis not present

## 2019-12-21 DIAGNOSIS — Z9181 History of falling: Secondary | ICD-10-CM | POA: Diagnosis not present

## 2019-12-21 DIAGNOSIS — F0280 Dementia in other diseases classified elsewhere without behavioral disturbance: Secondary | ICD-10-CM | POA: Diagnosis not present

## 2019-12-21 DIAGNOSIS — M545 Low back pain: Secondary | ICD-10-CM | POA: Diagnosis not present

## 2019-12-21 DIAGNOSIS — E559 Vitamin D deficiency, unspecified: Secondary | ICD-10-CM | POA: Diagnosis not present

## 2019-12-24 DIAGNOSIS — G8929 Other chronic pain: Secondary | ICD-10-CM | POA: Diagnosis not present

## 2019-12-24 DIAGNOSIS — M1611 Unilateral primary osteoarthritis, right hip: Secondary | ICD-10-CM | POA: Diagnosis not present

## 2019-12-24 DIAGNOSIS — F0280 Dementia in other diseases classified elsewhere without behavioral disturbance: Secondary | ICD-10-CM | POA: Diagnosis not present

## 2019-12-24 DIAGNOSIS — G309 Alzheimer's disease, unspecified: Secondary | ICD-10-CM | POA: Diagnosis not present

## 2019-12-24 DIAGNOSIS — E538 Deficiency of other specified B group vitamins: Secondary | ICD-10-CM | POA: Diagnosis not present

## 2019-12-24 DIAGNOSIS — M545 Low back pain: Secondary | ICD-10-CM | POA: Diagnosis not present

## 2019-12-28 ENCOUNTER — Ambulatory Visit (INDEPENDENT_AMBULATORY_CARE_PROVIDER_SITE_OTHER): Payer: Medicare Other

## 2019-12-28 ENCOUNTER — Other Ambulatory Visit: Payer: Self-pay

## 2019-12-28 DIAGNOSIS — E538 Deficiency of other specified B group vitamins: Secondary | ICD-10-CM

## 2019-12-28 DIAGNOSIS — Z87891 Personal history of nicotine dependence: Secondary | ICD-10-CM

## 2019-12-28 DIAGNOSIS — G8929 Other chronic pain: Secondary | ICD-10-CM

## 2019-12-28 DIAGNOSIS — F0280 Dementia in other diseases classified elsewhere without behavioral disturbance: Secondary | ICD-10-CM

## 2019-12-28 DIAGNOSIS — M810 Age-related osteoporosis without current pathological fracture: Secondary | ICD-10-CM | POA: Diagnosis not present

## 2019-12-28 DIAGNOSIS — G309 Alzheimer's disease, unspecified: Secondary | ICD-10-CM

## 2019-12-28 DIAGNOSIS — E559 Vitamin D deficiency, unspecified: Secondary | ICD-10-CM | POA: Diagnosis not present

## 2019-12-28 DIAGNOSIS — M1611 Unilateral primary osteoarthritis, right hip: Secondary | ICD-10-CM | POA: Diagnosis not present

## 2019-12-28 DIAGNOSIS — Z9181 History of falling: Secondary | ICD-10-CM

## 2019-12-28 DIAGNOSIS — F329 Major depressive disorder, single episode, unspecified: Secondary | ICD-10-CM | POA: Diagnosis not present

## 2019-12-28 DIAGNOSIS — I10 Essential (primary) hypertension: Secondary | ICD-10-CM

## 2019-12-28 DIAGNOSIS — M545 Low back pain: Secondary | ICD-10-CM

## 2019-12-28 DIAGNOSIS — K2990 Gastroduodenitis, unspecified, without bleeding: Secondary | ICD-10-CM

## 2019-12-28 DIAGNOSIS — E785 Hyperlipidemia, unspecified: Secondary | ICD-10-CM | POA: Diagnosis not present

## 2019-12-29 DIAGNOSIS — E538 Deficiency of other specified B group vitamins: Secondary | ICD-10-CM | POA: Diagnosis not present

## 2019-12-29 DIAGNOSIS — F0280 Dementia in other diseases classified elsewhere without behavioral disturbance: Secondary | ICD-10-CM | POA: Diagnosis not present

## 2019-12-29 DIAGNOSIS — G8929 Other chronic pain: Secondary | ICD-10-CM | POA: Diagnosis not present

## 2019-12-29 DIAGNOSIS — M545 Low back pain: Secondary | ICD-10-CM | POA: Diagnosis not present

## 2019-12-29 DIAGNOSIS — M1611 Unilateral primary osteoarthritis, right hip: Secondary | ICD-10-CM | POA: Diagnosis not present

## 2019-12-29 DIAGNOSIS — G309 Alzheimer's disease, unspecified: Secondary | ICD-10-CM | POA: Diagnosis not present

## 2019-12-31 DIAGNOSIS — M545 Low back pain: Secondary | ICD-10-CM | POA: Diagnosis not present

## 2019-12-31 DIAGNOSIS — G309 Alzheimer's disease, unspecified: Secondary | ICD-10-CM | POA: Diagnosis not present

## 2019-12-31 DIAGNOSIS — F0280 Dementia in other diseases classified elsewhere without behavioral disturbance: Secondary | ICD-10-CM | POA: Diagnosis not present

## 2019-12-31 DIAGNOSIS — E538 Deficiency of other specified B group vitamins: Secondary | ICD-10-CM | POA: Diagnosis not present

## 2019-12-31 DIAGNOSIS — G8929 Other chronic pain: Secondary | ICD-10-CM | POA: Diagnosis not present

## 2019-12-31 DIAGNOSIS — M1611 Unilateral primary osteoarthritis, right hip: Secondary | ICD-10-CM | POA: Diagnosis not present

## 2020-01-05 DIAGNOSIS — M545 Low back pain: Secondary | ICD-10-CM | POA: Diagnosis not present

## 2020-01-05 DIAGNOSIS — E538 Deficiency of other specified B group vitamins: Secondary | ICD-10-CM | POA: Diagnosis not present

## 2020-01-05 DIAGNOSIS — G309 Alzheimer's disease, unspecified: Secondary | ICD-10-CM | POA: Diagnosis not present

## 2020-01-05 DIAGNOSIS — M1611 Unilateral primary osteoarthritis, right hip: Secondary | ICD-10-CM | POA: Diagnosis not present

## 2020-01-05 DIAGNOSIS — G8929 Other chronic pain: Secondary | ICD-10-CM | POA: Diagnosis not present

## 2020-01-05 DIAGNOSIS — F0280 Dementia in other diseases classified elsewhere without behavioral disturbance: Secondary | ICD-10-CM | POA: Diagnosis not present

## 2020-01-07 DIAGNOSIS — E538 Deficiency of other specified B group vitamins: Secondary | ICD-10-CM | POA: Diagnosis not present

## 2020-01-07 DIAGNOSIS — M1611 Unilateral primary osteoarthritis, right hip: Secondary | ICD-10-CM | POA: Diagnosis not present

## 2020-01-07 DIAGNOSIS — M545 Low back pain: Secondary | ICD-10-CM | POA: Diagnosis not present

## 2020-01-07 DIAGNOSIS — G8929 Other chronic pain: Secondary | ICD-10-CM | POA: Diagnosis not present

## 2020-01-07 DIAGNOSIS — F0280 Dementia in other diseases classified elsewhere without behavioral disturbance: Secondary | ICD-10-CM | POA: Diagnosis not present

## 2020-01-07 DIAGNOSIS — G309 Alzheimer's disease, unspecified: Secondary | ICD-10-CM | POA: Diagnosis not present

## 2020-01-10 DIAGNOSIS — M1611 Unilateral primary osteoarthritis, right hip: Secondary | ICD-10-CM | POA: Diagnosis not present

## 2020-01-10 DIAGNOSIS — E538 Deficiency of other specified B group vitamins: Secondary | ICD-10-CM | POA: Diagnosis not present

## 2020-01-10 DIAGNOSIS — F0280 Dementia in other diseases classified elsewhere without behavioral disturbance: Secondary | ICD-10-CM | POA: Diagnosis not present

## 2020-01-10 DIAGNOSIS — M545 Low back pain: Secondary | ICD-10-CM | POA: Diagnosis not present

## 2020-01-10 DIAGNOSIS — G309 Alzheimer's disease, unspecified: Secondary | ICD-10-CM | POA: Diagnosis not present

## 2020-01-10 DIAGNOSIS — G8929 Other chronic pain: Secondary | ICD-10-CM | POA: Diagnosis not present

## 2020-01-12 DIAGNOSIS — E538 Deficiency of other specified B group vitamins: Secondary | ICD-10-CM | POA: Diagnosis not present

## 2020-01-12 DIAGNOSIS — M1611 Unilateral primary osteoarthritis, right hip: Secondary | ICD-10-CM | POA: Diagnosis not present

## 2020-01-12 DIAGNOSIS — F0280 Dementia in other diseases classified elsewhere without behavioral disturbance: Secondary | ICD-10-CM | POA: Diagnosis not present

## 2020-01-12 DIAGNOSIS — M545 Low back pain: Secondary | ICD-10-CM | POA: Diagnosis not present

## 2020-01-12 DIAGNOSIS — G309 Alzheimer's disease, unspecified: Secondary | ICD-10-CM | POA: Diagnosis not present

## 2020-01-12 DIAGNOSIS — G8929 Other chronic pain: Secondary | ICD-10-CM | POA: Diagnosis not present

## 2020-01-19 DIAGNOSIS — M545 Low back pain: Secondary | ICD-10-CM | POA: Diagnosis not present

## 2020-01-19 DIAGNOSIS — E538 Deficiency of other specified B group vitamins: Secondary | ICD-10-CM | POA: Diagnosis not present

## 2020-01-19 DIAGNOSIS — F0280 Dementia in other diseases classified elsewhere without behavioral disturbance: Secondary | ICD-10-CM | POA: Diagnosis not present

## 2020-01-19 DIAGNOSIS — G8929 Other chronic pain: Secondary | ICD-10-CM | POA: Diagnosis not present

## 2020-01-19 DIAGNOSIS — M1611 Unilateral primary osteoarthritis, right hip: Secondary | ICD-10-CM | POA: Diagnosis not present

## 2020-01-19 DIAGNOSIS — G309 Alzheimer's disease, unspecified: Secondary | ICD-10-CM | POA: Diagnosis not present

## 2020-01-20 DIAGNOSIS — M1611 Unilateral primary osteoarthritis, right hip: Secondary | ICD-10-CM | POA: Diagnosis not present

## 2020-01-20 DIAGNOSIS — K2990 Gastroduodenitis, unspecified, without bleeding: Secondary | ICD-10-CM | POA: Diagnosis not present

## 2020-01-20 DIAGNOSIS — F0280 Dementia in other diseases classified elsewhere without behavioral disturbance: Secondary | ICD-10-CM | POA: Diagnosis not present

## 2020-01-20 DIAGNOSIS — Z87891 Personal history of nicotine dependence: Secondary | ICD-10-CM | POA: Diagnosis not present

## 2020-01-20 DIAGNOSIS — I10 Essential (primary) hypertension: Secondary | ICD-10-CM | POA: Diagnosis not present

## 2020-01-20 DIAGNOSIS — M810 Age-related osteoporosis without current pathological fracture: Secondary | ICD-10-CM | POA: Diagnosis not present

## 2020-01-20 DIAGNOSIS — Z9181 History of falling: Secondary | ICD-10-CM | POA: Diagnosis not present

## 2020-01-20 DIAGNOSIS — E538 Deficiency of other specified B group vitamins: Secondary | ICD-10-CM | POA: Diagnosis not present

## 2020-01-20 DIAGNOSIS — M545 Low back pain: Secondary | ICD-10-CM | POA: Diagnosis not present

## 2020-01-20 DIAGNOSIS — G309 Alzheimer's disease, unspecified: Secondary | ICD-10-CM | POA: Diagnosis not present

## 2020-01-20 DIAGNOSIS — E785 Hyperlipidemia, unspecified: Secondary | ICD-10-CM | POA: Diagnosis not present

## 2020-01-20 DIAGNOSIS — E559 Vitamin D deficiency, unspecified: Secondary | ICD-10-CM | POA: Diagnosis not present

## 2020-01-20 DIAGNOSIS — F329 Major depressive disorder, single episode, unspecified: Secondary | ICD-10-CM | POA: Diagnosis not present

## 2020-01-20 DIAGNOSIS — G8929 Other chronic pain: Secondary | ICD-10-CM | POA: Diagnosis not present

## 2020-01-21 DIAGNOSIS — G8929 Other chronic pain: Secondary | ICD-10-CM | POA: Diagnosis not present

## 2020-01-21 DIAGNOSIS — F0280 Dementia in other diseases classified elsewhere without behavioral disturbance: Secondary | ICD-10-CM | POA: Diagnosis not present

## 2020-01-21 DIAGNOSIS — M1611 Unilateral primary osteoarthritis, right hip: Secondary | ICD-10-CM | POA: Diagnosis not present

## 2020-01-21 DIAGNOSIS — E538 Deficiency of other specified B group vitamins: Secondary | ICD-10-CM | POA: Diagnosis not present

## 2020-01-21 DIAGNOSIS — M545 Low back pain: Secondary | ICD-10-CM | POA: Diagnosis not present

## 2020-01-21 DIAGNOSIS — G309 Alzheimer's disease, unspecified: Secondary | ICD-10-CM | POA: Diagnosis not present

## 2020-01-28 DIAGNOSIS — E538 Deficiency of other specified B group vitamins: Secondary | ICD-10-CM | POA: Diagnosis not present

## 2020-01-28 DIAGNOSIS — M545 Low back pain: Secondary | ICD-10-CM | POA: Diagnosis not present

## 2020-01-28 DIAGNOSIS — G309 Alzheimer's disease, unspecified: Secondary | ICD-10-CM | POA: Diagnosis not present

## 2020-01-28 DIAGNOSIS — G8929 Other chronic pain: Secondary | ICD-10-CM | POA: Diagnosis not present

## 2020-01-28 DIAGNOSIS — F0280 Dementia in other diseases classified elsewhere without behavioral disturbance: Secondary | ICD-10-CM | POA: Diagnosis not present

## 2020-01-28 DIAGNOSIS — M1611 Unilateral primary osteoarthritis, right hip: Secondary | ICD-10-CM | POA: Diagnosis not present

## 2020-02-04 DIAGNOSIS — M545 Low back pain: Secondary | ICD-10-CM | POA: Diagnosis not present

## 2020-02-04 DIAGNOSIS — G8929 Other chronic pain: Secondary | ICD-10-CM | POA: Diagnosis not present

## 2020-02-04 DIAGNOSIS — E538 Deficiency of other specified B group vitamins: Secondary | ICD-10-CM | POA: Diagnosis not present

## 2020-02-04 DIAGNOSIS — M1611 Unilateral primary osteoarthritis, right hip: Secondary | ICD-10-CM | POA: Diagnosis not present

## 2020-02-04 DIAGNOSIS — F0280 Dementia in other diseases classified elsewhere without behavioral disturbance: Secondary | ICD-10-CM | POA: Diagnosis not present

## 2020-02-04 DIAGNOSIS — G309 Alzheimer's disease, unspecified: Secondary | ICD-10-CM | POA: Diagnosis not present

## 2020-02-11 DIAGNOSIS — M545 Low back pain: Secondary | ICD-10-CM | POA: Diagnosis not present

## 2020-02-11 DIAGNOSIS — E538 Deficiency of other specified B group vitamins: Secondary | ICD-10-CM | POA: Diagnosis not present

## 2020-02-11 DIAGNOSIS — G309 Alzheimer's disease, unspecified: Secondary | ICD-10-CM | POA: Diagnosis not present

## 2020-02-11 DIAGNOSIS — F0280 Dementia in other diseases classified elsewhere without behavioral disturbance: Secondary | ICD-10-CM | POA: Diagnosis not present

## 2020-02-11 DIAGNOSIS — M1611 Unilateral primary osteoarthritis, right hip: Secondary | ICD-10-CM | POA: Diagnosis not present

## 2020-02-11 DIAGNOSIS — G8929 Other chronic pain: Secondary | ICD-10-CM | POA: Diagnosis not present

## 2020-02-23 ENCOUNTER — Other Ambulatory Visit: Payer: Self-pay | Admitting: Family Medicine

## 2020-02-23 DIAGNOSIS — G309 Alzheimer's disease, unspecified: Secondary | ICD-10-CM

## 2020-02-23 DIAGNOSIS — F4321 Adjustment disorder with depressed mood: Secondary | ICD-10-CM

## 2020-03-03 ENCOUNTER — Telehealth: Payer: Medicare Other

## 2020-04-03 ENCOUNTER — Ambulatory Visit: Payer: Medicare Other | Admitting: *Deleted

## 2020-04-03 ENCOUNTER — Other Ambulatory Visit: Payer: Self-pay | Admitting: Family Medicine

## 2020-04-03 DIAGNOSIS — F0280 Dementia in other diseases classified elsewhere without behavioral disturbance: Secondary | ICD-10-CM

## 2020-04-03 DIAGNOSIS — I10 Essential (primary) hypertension: Secondary | ICD-10-CM

## 2020-04-03 DIAGNOSIS — R630 Anorexia: Secondary | ICD-10-CM

## 2020-04-03 NOTE — Patient Instructions (Signed)
Visit Information  Goals Addressed            This Visit's Progress   . Chronic Disease Management Needs       CARE PLAN ENTRY (see longtitudinal plan of care for additional care plan information)  Current Barriers:  . Chronic Disease Management support, education, and care coordination needs related to HTN, Alzheimer's dementia, osteoporosis  Clinical Goal(s) related to HTN, Alzheimer's dementia, osteoporosis:  Over the next 30 days, patient will:  . Work with the care management team to address educational, disease management, and care coordination needs  . Begin or continue self health monitoring activities as directed today Measure and record blood pressure 3-4 times per week . Call provider office for new or worsened signs and symptoms Blood pressure findings outside established parameters and New or worsened symptom related to dementia . Call care management team with questions or concerns . Verbalize basic understanding of patient centered plan of care established today  Interventions related to HTN, Alzheimer's dementia, osteoporosis:  . Evaluation of current treatment plans and patient's adherence to plan as established by provider . Assessed patient understanding of disease states . Assessed patient's education and care coordination needs . Provided disease specific education to patient  . Collaborated with appropriate clinical care team members regarding patient needs . Chart reviewed including recent office notes and lab results . Talked with patient's daughter by telephone . Discussed family/social support o Daughter assists as needed and provides transportation . Discussed mobility and physical activity level o Had PT for LBP that ended several weeks ago o Daughter encourages her to continue to do PT exercises  . Discussed ability to perform ADLs o Able to do basic ADLs o Pays someone to do housekeeping o Daughter assists with any needs . Discussed diet and  nutrition o Mainly snacks during the day and has a good portion of a well balanced supper o Drinks protein/nutrition supplement in the morning o Has lost a couple of pounds recently . Recommend that she weight a and record weight a few times a week a monitor for any weight loss . Report any weight loss to PCP  . Discussed fall precautions and use of safety devices . Reviewed upcoming appointments . Discussed blood pressure management and encouraged to continue checking at home several days a week . Encouraged daughter to report any BP readings outside of recommended range to PCP . Provided with RN contact number and encouraged to reach out as needed   Patient Self Care Activities related to HTN, Alzheimer's dementia, osteoporosis:  . Patient is unable to independently self-manage chronic health conditions  Initial goal documentation      Susan Bennett was given information about Chronic Care Management services including:  1. CCM service includes personalized support from designated clinical staff supervised by her physician, including individualized plan of care and coordination with other care providers 2. 24/7 contact phone numbers for assistance for urgent and routine care needs. 3. Service will only be billed when office clinical staff spend 20 minutes or more in a month to coordinate care. 4. Only one practitioner may furnish and bill the service in a calendar month. 5. The patient may stop CCM services at any time (effective at the end of the month) by phone call to the office staff. 6. The patient will be responsible for cost sharing (co-pay) of up to 20% of the service fee (after annual deductible is met).  Patient agreed to services and verbal consent obtained.  Patient verbalizes understanding of instructions provided today.   Follow-up Plan Telephone follow up appointment with care management team member scheduled for: 05/15/20 with RN Care Manager The patient has been  provided with contact information for the care management team and has been advised to call with any health related questions or concerns.  Next PCP appointment scheduled for: 04/19/20 with Dr Lajuana Ripple  [name], BSN, RN-BC [place] [facility] [place] Management Direct Dial: [phone]

## 2020-04-03 NOTE — Chronic Care Management (AMB) (Signed)
Chronic Care Management   Initial Visit Note  04/03/2020 Name: Susan Bennett MRN: [id number] DOB: Nov 25, 1940  Referred by: Raliegh Ip, DO Reason for referral : Chronic Care Management (Initial Visit)   Susan Bennett is a 79 y.o. year old female who is a primary care patient of Raliegh Ip, DO. The CCM team was consulted for assistance with chronic disease management and care coordination needs related to HTN, Alzheimer's dementia, osteoporosis.  Review of patient status, including review of consultants reports, relevant laboratory and other test results, and collaboration with appropriate care team members and the patient's provider was performed as part of comprehensive patient evaluation and provision of chronic care management services.    Subjective: I spoke with Ms Inda's daughter by telephone today regarding management of her chronic medical conditions.   SDOH (Social Determinants of Health) assessments performed: Yes See Care Plan activities for detailed interventions related to SDOH     Objective: Outpatient Encounter Medications as of 04/03/2020  Medication Sig   amLODipine (NORVASC) 10 MG tablet Take 1 tablet (10 mg total) by mouth daily.   clotrimazole-betamethasone (LOTRISONE) cream Apply topically 2 (two) times daily. x7 days as needed for groin rash   diclofenac (VOLTAREN) 50 MG EC tablet Take 1 tablet (50 mg total) by mouth 2 (two) times daily as needed for moderate pain.   donepezil (ARICEPT) 5 MG tablet Take 1 tablet (5 mg total) by mouth at bedtime.   lidocaine (LIDODERM) 5 % Place 1 patch onto the skin daily. Remove & Discard patch within 12 hours or as directed by MD   memantine (NAMENDA) 10 MG tablet TAKE 1/2 (ONE-HALF) TABLET BY MOUTH QAM and 1 tablet every evening.   sertraline (ZOLOFT) 50 MG tablet TAKE 1 & 1/2 (ONE & ONE-HALF) TABLETS BY MOUTH ONCE DAILY   [DISCONTINUED] benazepril (LOTENSIN) 40 MG tablet TAKE 1 TABLET BY MOUTH ONCE DAILY  for blood pressure   Facility-Administered Encounter Medications as of 04/03/2020  Medication   cyanocobalamin ((VITAMIN B-12)) injection 1,000 mcg     BP Readings from Last 3 Encounters:  12/15/19 (!) 144/62  09/15/19 124/65  05/12/19 (!) 184/69    Goals Addressed            This Visit's Progress    Chronic Disease Management Needs       CARE PLAN ENTRY (see longtitudinal plan of care for additional care plan information)  Current Barriers:   Chronic Disease Management support, education, and care coordination needs related to HTN, Alzheimer's dementia, osteoporosis  Clinical Goal(s) related to HTN, Alzheimer's dementia, osteoporosis:  Over the next 30 days, patient will:   Work with the care management team to address educational, disease management, and care coordination needs   Begin or continue self health monitoring activities as directed today Measure and record blood pressure 3-4 times per week  Call provider office for new or worsened signs and symptoms Blood pressure findings outside established parameters and New or worsened symptom related to dementia  Call care management team with questions or concerns  Verbalize basic understanding of patient centered plan of care established today  Interventions related to HTN, Alzheimer's dementia, osteoporosis:   Evaluation of current treatment plans and patient's adherence to plan as established by provider  Assessed patient understanding of disease states  Assessed patient's education and care coordination needs  Provided disease specific education to patient   Collaborated with appropriate clinical care team members regarding patient needs  Chart reviewed including recent  office notes and lab results  Talked with patient's daughter by telephone  Discussed family/social support o Daughter assists as needed and provides transportation  Discussed mobility and physical activity level o Had PT for LBP that  ended several weeks ago o Daughter encourages her to continue to do PT exercises   Discussed ability to perform ADLs o Able to do basic ADLs o Pays someone to do housekeeping o Daughter assists with any needs  Discussed diet and nutrition o Mainly snacks during the day and has a good portion of a well balanced supper o Drinks protein/nutrition supplement in the morning o Has lost a couple of pounds recently  Recommend that she weight a and record weight a few times a week a monitor for any weight loss  Report any weight loss to PCP   Discussed fall precautions and use of safety devices  Reviewed upcoming appointments  Discussed blood pressure management and encouraged to continue checking at home several days a week  Encouraged daughter to report any BP readings outside of recommended range to PCP  Provided with RN contact number and encouraged to reach out as needed   Patient Self Care Activities related to HTN, Alzheimer's dementia, osteoporosis:   Patient is unable to independently self-manage chronic health conditions  Initial goal documentation         Plan:   Telephone follow up appointment with care management team member scheduled for: 05/15/20 with RN Care Manager The patient has been provided with contact information for the care management team and has been advised to call with any health related questions or concerns.  Next PCP appointment scheduled for: 04/19/20 with Dr Nadine Counts  Demetrios Loll, BSN, RN-BC Embedded Chronic Care Manager [facility] / [facility] Care Management Direct Dial: [phone]

## 2020-04-19 ENCOUNTER — Ambulatory Visit (INDEPENDENT_AMBULATORY_CARE_PROVIDER_SITE_OTHER): Payer: Medicare Other | Admitting: Family Medicine

## 2020-04-19 ENCOUNTER — Other Ambulatory Visit: Payer: Self-pay

## 2020-04-19 ENCOUNTER — Encounter: Payer: Self-pay | Admitting: Family Medicine

## 2020-04-19 VITALS — BP 138/62 | HR 57 | Temp 97.4°F | Ht 64.0 in | Wt 104.0 lb

## 2020-04-19 DIAGNOSIS — I10 Essential (primary) hypertension: Secondary | ICD-10-CM | POA: Diagnosis not present

## 2020-04-19 DIAGNOSIS — F4321 Adjustment disorder with depressed mood: Secondary | ICD-10-CM

## 2020-04-19 DIAGNOSIS — M545 Low back pain, unspecified: Secondary | ICD-10-CM | POA: Diagnosis not present

## 2020-04-19 DIAGNOSIS — G8929 Other chronic pain: Secondary | ICD-10-CM

## 2020-04-19 DIAGNOSIS — E538 Deficiency of other specified B group vitamins: Secondary | ICD-10-CM

## 2020-04-19 DIAGNOSIS — M25551 Pain in right hip: Secondary | ICD-10-CM | POA: Diagnosis not present

## 2020-04-19 DIAGNOSIS — G309 Alzheimer's disease, unspecified: Secondary | ICD-10-CM | POA: Diagnosis not present

## 2020-04-19 DIAGNOSIS — F0280 Dementia in other diseases classified elsewhere without behavioral disturbance: Secondary | ICD-10-CM

## 2020-04-19 MED ORDER — AMLODIPINE BESYLATE 10 MG PO TABS: 10.0000 mg | ORAL_TABLET | Freq: Every day | ORAL | 3 refills | Status: DC

## 2020-04-19 MED ORDER — BENAZEPRIL HCL 40 MG PO TABS: 40.0000 mg | ORAL_TABLET | Freq: Every day | ORAL | 3 refills | Status: DC

## 2020-04-19 MED ORDER — CLOTRIMAZOLE-BETAMETHASONE 1-0.05 % EX CREA: TOPICAL_CREAM | Freq: Two times a day (BID) | CUTANEOUS | 0 refills | Status: DC

## 2020-04-19 MED ORDER — LIDOCAINE 5 % EX PTCH: 1.0000 | MEDICATED_PATCH | CUTANEOUS | 3 refills | Status: DC

## 2020-04-19 NOTE — Progress Notes (Signed)
Subjective: CC: Follow-up memory, pain, B12 deficiency PCP: Raliegh Ip, DO Susan Bennett is a 79 y.o. female who is brought to the office today by caregiver.  She is presenting to clinic today for:  1.  B12 deficiency/memory problems No falls.  Memory is stable.  She is unsure if she is taking her Aricept and Namenda.  Her niece comments that we should contact her daughter, Reece Levy, to ensure which medicine she is taking and what refills are needed.  She also would like [place] to have Deb set up her B12 injections with the nurse.  2.  Right hip pain Patient reports good relief of right hip pain with a lidocaine patch.  Right now she is not having any issues.  She does need a refill on the lidocaine patch.  Denies any falls.  3.  Hypertension Patient reports compliance with Norvasc 10 mg daily, benazepril 40 mg daily.  No chest pain, shortness of breath, dizziness.  ROS: Per HPI  No Known Allergies Past Medical History:  Diagnosis Date   Chronic diarrhea    Contact dermatitis and other eczema, due to unspecified cause    Depressive disorder, not elsewhere classified    Osteoporosis, unspecified    Other and unspecified hyperlipidemia    Other B-complex deficiencies    Unspecified essential hypertension    Unspecified gastritis and gastroduodenitis without mention of hemorrhage    Unspecified vitamin D deficiency     Current Outpatient Medications:    amLODipine (NORVASC) 10 MG tablet, Take 1 tablet (10 mg total) by mouth daily., Disp: 90 tablet, Rfl: 1   benazepril (LOTENSIN) 40 MG tablet, Take 1 tablet by mouth once daily for blood pressure, Disp: 90 tablet, Rfl: 0   clotrimazole-betamethasone (LOTRISONE) cream, Apply topically 2 (two) times daily. x7 days as needed for groin rash, Disp: 45 g, Rfl: 0   diclofenac (VOLTAREN) 50 MG EC tablet, Take 1 tablet (50 mg total) by mouth 2 (two) times daily as needed for moderate pain., Disp: 60 tablet, Rfl: 0    donepezil (ARICEPT) 5 MG tablet, Take 1 tablet (5 mg total) by mouth at bedtime., Disp: 30 tablet, Rfl: 0   lidocaine (LIDODERM) 5 %, Place 1 patch onto the skin daily. Remove & Discard patch within 12 hours or as directed by MD, Disp: 30 patch, Rfl: 3   memantine (NAMENDA) 10 MG tablet, TAKE 1/2 (ONE-HALF) TABLET BY MOUTH QAM and 1 tablet every evening., Disp: 45 tablet, Rfl: 2   sertraline (ZOLOFT) 50 MG tablet, TAKE 1 & 1/2 (ONE & ONE-HALF) TABLETS BY MOUTH ONCE DAILY, Disp: 45 tablet, Rfl: 0  Current Facility-Administered Medications:    cyanocobalamin ((VITAMIN B-12)) injection 1,000 mcg, 1,000 mcg, Intramuscular, Q30 days, Jones, Angel S, PA-C, 1,000 mcg at 12/15/19 1558 Social History   Socioeconomic History   Marital status: Divorced    Spouse name: Not on file   Number of children: 1   Years of education: Not on file   Highest education level: 12th grade  Occupational History   Occupation: retired   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  Tobacco Use   Smoking status: Former Smoker    Types: Cigarettes    Quit date: 02/20/2001    Years since quitting: 19.1   Smokeless tobacco: Never Used  Vaping Use   Vaping Use: Never used  Substance and Sexual Activity   Alcohol use: No   Drug use: No   Sexual activity: Not Currently  Other Topics Concern   Not on file  Social History Narrative   Lives alone in 1 story home with full basement   Has 1 adult child   Highest level of education: high school diploma   Retired Advertising account planner   Social Determinants of Corporate investment banker Strain: Low Risk    Difficulty of Paying Living Expenses: Not hard at all  Food Insecurity: No Food Insecurity   Worried About Programme researcher, broadcasting/film/video in the Last Year: Never true   Barista in the Last Year: Never true  Transportation Needs: No Transportation Needs   Lack of Transportation (Medical): No   Lack of Transportation (Non-Medical): No  Physical  Activity: Inactive   Days of Exercise per Week: 0 days   Minutes of Exercise per Session: 0 min  Stress: No Stress Concern Present   Feeling of Stress : Not at all  Social Connections: Socially Isolated   Frequency of Communication with Friends and Family: More than three times a week   Frequency of Social Gatherings with Friends and Family: More than three times a week   Attends Religious Services: Never   Database administrator or Organizations: No   Attends Engineer, structural: Never   Marital Status: Divorced  Catering manager Violence: Not At Risk   Fear of Current or Ex-Partner: No   Emotionally Abused: No   Physically Abused: No   Sexually Abused: No   Family History  Problem Relation Age of Onset   Healthy Sister    Healthy Brother    Throat cancer Brother    Stroke Brother    Heart disease Brother    Throat cancer Maternal Aunt     Objective: Office vital signs reviewed. BP 138/62    Pulse (!) 57    Temp (!) 97.4 F (36.3 C) (Temporal)    Ht 5\' 4"  (1.626 m)    Wt 104 lb (47.2 kg)    SpO2 95%    BMI 17.85 kg/m   Physical Examination:  General: Awake, alert, thin, frail, elderly female, No acute distress HEENT: Normal, sclera white, MMM Cardio: regular rate and rhythm, S1S2 heard, no murmurs appreciated Pulm: clear to auscultation bilaterally, no wheezes, rhonchi or rales; normal work of breathing on room air MSK: Ambulating independently.  Able to independently get up on exam table Neuro: Light touch sensation grossly intact.  She has intact vibratory sensation bilaterally.  Depression screen [facility] 2/9 04/19/2020 12/15/2019 09/15/2019  Decreased Interest 0 1 0  Down, Depressed, Hopeless 0 1 0  PHQ - 2 Score 0 2 0  Altered sleeping 0 0 0  Tired, decreased energy 0 1 1  Change in appetite 0 0 0  Feeling bad or failure about yourself  0 0 0  Trouble concentrating 0 1 0  Moving slowly or fidgety/restless 0 0 0  Suicidal thoughts 0 0 0    PHQ-9 Score 0 4 1  Difficult doing work/chores - Not difficult at all Not difficult at all   No flowsheet data found.  Assessment/ Plan: 79 y.o. female   1. Vitamin B12 deficiency Vitamin B12 check since last visit was normal.  May not need B12 supplementation.  2. Alzheimer's dementia without behavioral disturbance, unspecified timing of dementia onset (HCC) I attempted to contact her daughter with regards to her Aricept and Namenda.  No refills sent in a while this concerns me that she may not be taking the medicine.  3.  Chronic right-sided low back pain without sciatica Stable with as needed use of lidocaine patch.  Renewal sent - lidocaine (LIDODERM) 5 %; Place 1 patch onto the skin daily. Remove & Discard patch within 12 hours or as directed by MD  Dispense: 30 patch; Refill: 3  4. Grief reaction Continue Zoloft  5. Essential hypertension Controlled - amLODipine (NORVASC) 10 MG tablet; Take 1 tablet (10 mg total) by mouth daily.  Dispense: 90 tablet; Refill: 3 - benazepril (LOTENSIN) 40 MG tablet; Take 1 tablet (40 mg total) by mouth daily.  Dispense: 90 tablet; Refill: 3  6. Hip pain, chronic, right As above - lidocaine (LIDODERM) 5 %; Place 1 patch onto the skin daily. Remove & Discard patch within 12 hours or as directed by MD  Dispense: 30 patch; Refill: 3    No orders of the defined types were placed in this encounter.  No orders of the defined types were placed in this encounter.    Raliegh Ip, DO [facility] [phone]

## 2020-04-19 NOTE — Patient Instructions (Signed)
Physical with fasting labs next visit  Make sure to make her B12 shot appointments with the nurse.  She got her B12 shot today.  Her goal is to eat at least 2 meals that are rich in protein per day Easy snacking proteins:  [place] yogurt, cereal with milk  Ham/chicken salad/ [place] sandwich  Eggs, boiled/ scrambled/ fried/ poached  Boost/ Ensure shake    Protein Content in Foods  Generally, most healthy people need around 50 grams of protein each day. Depending on your overall health, you may need more or less protein in your diet. Talk to your health care provider or dietitian about how much protein you need. See the following list for the protein content of some common foods. High-protein foods High-protein foods contain 4 grams (4 g) or more of protein per serving. They include:  Beef, ground sirloin (cooked) -- 3 oz have 24 g of protein.  Cheese (hard) -- 1 oz has 7 g of protein.  Chicken breast, boneless and skinless (cooked) -- 3 oz have 13.4 g of protein.  Cottage cheese -- 1/2 cup has 13.4 g of protein.  Egg -- 1 egg has 6 g of protein.  Fish, filet (cooked) -- 1 oz has 6-7 g of protein.  Garbanzo beans (canned or cooked) -- 1/2 cup has 6-7 g of protein.  Kidney beans (canned or cooked) -- 1/2 cup has 6-7 g of protein.  Lamb (cooked) -- 3 oz has 24 g of protein.  Milk -- 1 cup (8 oz) has 8 g of protein.  Nuts (peanuts, pistachios, almonds) -- 1 oz has 6 g of protein.  Peanut butter -- 1 oz has 7-8 g of protein.  Pork tenderloin (cooked) -- 3 oz has 18.4 g of protein.  Pumpkin seeds -- 1 oz has 8.5 g of protein.  Soybeans (roasted) -- 1 oz has 8 g of protein.  Soybeans (cooked) -- 1/2 cup has 11 g of protein.  Soy milk -- 1 cup (8 oz) has 5-10 g of protein.  Soy or vegetable patty -- 1 patty has 11 g of protein.  Sunflower seeds -- 1 oz has 5.5 g of protein.  Tofu (firm) -- 1/2 cup has 20 g of protein.  Tuna (canned in water) -- 3 oz has 20 g of  protein.  Yogurt -- 6 oz has 8 g of protein. Low-protein foods Low-protein foods contain 3 grams (3 g) or less of protein per serving. They include:  Beets (raw or cooked) -- 1/2 cup has 1.5 g of protein.  Bran cereal -- 1/2 cup has 2-3 g of protein.  Bread -- 1 slice has 2.5 g of protein.  Broccoli (raw or cooked) -- 1/2 cup has 2 g of protein.  Collard greens (raw or cooked) -- 1/2 cup has 2 g of protein.  Corn (fresh or cooked) -- 1/2 cup has 2 g of protein.  Cream cheese -- 1 oz has 2 g of protein.  Creamer (half-and-half) -- 1 oz has 1 g of protein.  Flour tortilla -- 1 tortilla has 2.5 g of protein  Frozen yogurt -- 1/2 cup has 3 g of protein.  Fruit or vegetable juice -- 1/2 cup has 1 g of protein.  Green beans (raw or cooked) -- 1/2 cup has 1 g of protein.  Green peas (canned) -- 1/2 cup has 3.5 g of protein.  Muffins -- 1 small muffin (2 oz) has 3 g of protein.  Oatmeal (cooked) -- 1/2  cup has 3 g of protein.  Potato (baked with skin) -- 1 medium potato has 3 g of protein.  Rice (cooked) -- 1/2 cup has 2.5-3.5 g of protein.  Sour cream -- 1/2 cup has 2.5 g of protein.  Spinach (cooked) -- 1/2 cup has 3 g of protein.  Squash (cooked) -- 1/2 cup has 1.5 g of protein. Actual amounts of protein may be different depending on processing. Talk with your health care provider or dietitian about what foods are recommended for you. This information is not intended to replace advice given to you by your health care provider. Make sure you discuss any questions you have with your health care provider. Document Revised: 01/29/2016 Document Reviewed: 01/29/2016 Elsevier Patient Education  2020 [employer].

## 2020-04-20 ENCOUNTER — Telehealth: Payer: Self-pay

## 2020-04-20 LAB — VITAMIN B12: Vitamin B-12: 237 pg/mL (ref 232–1245)

## 2020-04-20 NOTE — Telephone Encounter (Signed)
Spoke with daughter about patient - states that she is taking all three of those medications daily.  States she may need refills but she takes them all daily.

## 2020-04-20 NOTE — Telephone Encounter (Signed)
-----   Message from Raliegh Ip, DO sent at 04/19/2020  3:35 PM EST ----- Can we please call Deb and med rec pt.  No refills on Zoloft, Namenda or Aricept in a while.  Niece was not sure if pt was taking meds and pt has dementia so could not tell me.  I tried calling Deb but no answer at 3:34pm on 04/19/20.

## 2020-04-21 ENCOUNTER — Other Ambulatory Visit: Payer: Self-pay | Admitting: Family Medicine

## 2020-04-21 DIAGNOSIS — F4321 Adjustment disorder with depressed mood: Secondary | ICD-10-CM

## 2020-04-21 DIAGNOSIS — F0280 Dementia in other diseases classified elsewhere without behavioral disturbance: Secondary | ICD-10-CM

## 2020-04-21 MED ORDER — DONEPEZIL HCL 5 MG PO TABS: 5.0000 mg | ORAL_TABLET | Freq: Every day | ORAL | 3 refills | Status: DC

## 2020-04-21 MED ORDER — SERTRALINE HCL 50 MG PO TABS: ORAL_TABLET | ORAL | 3 refills | Status: DC

## 2020-04-21 MED ORDER — MEMANTINE HCL 10 MG PO TABS: ORAL_TABLET | ORAL | 3 refills | Status: DC

## 2020-04-24 ENCOUNTER — Telehealth: Payer: Self-pay

## 2020-04-24 NOTE — Telephone Encounter (Signed)
Daughter aware and verbalizes understanding. 

## 2020-04-24 NOTE — Telephone Encounter (Signed)
Yes, aricept can cause nightmares.  Some patients are treated with both Namenda and Aricept. However, if she's had problems with nightmares, I'd only take the namenda not the aricept.

## 2020-04-24 NOTE — Telephone Encounter (Signed)
Daughter now states that patient is only taking Nemenda and not Aricept.  Aricept was sent in on 11/19 since daughter originally stated that patient was on. Daughter wants to know if patient should take both the [facility] and Aricept ? If so she wants to know if thr Aricept is the medication that can cause bad night mares? If patient needs to start the Aricept she would like to know some major side effects that she should look out for.

## 2020-05-15 ENCOUNTER — Ambulatory Visit: Payer: Medicare Other | Admitting: *Deleted

## 2020-05-15 DIAGNOSIS — G309 Alzheimer's disease, unspecified: Secondary | ICD-10-CM

## 2020-05-15 DIAGNOSIS — I10 Essential (primary) hypertension: Secondary | ICD-10-CM

## 2020-05-15 NOTE — Chronic Care Management (AMB) (Signed)
  Chronic Care Management   Note  05/15/2020 Name: Susan Bennett MRN: [id number] DOB: [birth year]  I reached out to Susan Bennett's daughter, Gavin Pound, by telephone today regarding management of her mother's chronic medical conditions. Relevant office notes and lab results reviewed prior to call. Gavin Pound was unable to complete the full visit due to a scheduling conflict but stated that her mother is stable and that there are no acute needs. Discussed rescheduling the telephone follow-up for after Christmas. Advised to reach out to [facility] or PCP with any needs before then.   Follow up plan: Telephone follow up appointment with care management team member scheduled for: 05/31/2020 with RN Care Manager The patient has been provided with contact information for the care management team and has been advised to call with any health related questions or concerns.   Demetrios Loll, BSN, RN-BC Embedded Chronic Care Manager [facility] / [facility] Care Management Direct Dial: [phone]

## 2020-05-15 NOTE — Patient Instructions (Signed)
  Follow up plan: Telephone follow up appointment with care management team member scheduled for: 05/31/2020 with RN Care Manager The patient has been provided with contact information for the care management team and has been advised to call with any health related questions or concerns.   Demetrios Loll, BSN, RN-BC Embedded Chronic Care Manager [facility] / [facility] Care Management Direct Dial: [phone]

## 2020-05-31 ENCOUNTER — Ambulatory Visit: Payer: Medicare Other | Admitting: *Deleted

## 2020-05-31 DIAGNOSIS — I10 Essential (primary) hypertension: Secondary | ICD-10-CM

## 2020-05-31 DIAGNOSIS — G309 Alzheimer's disease, unspecified: Secondary | ICD-10-CM

## 2020-05-31 NOTE — Patient Instructions (Signed)
Telephone follow up scheduled with RN Care Manager for 06/09/20 at 2:00  Demetrios Loll, BSN, RN-BC Embedded Chronic Care Manager [facility] / [facility] Care Management Direct Dial: [phone]

## 2020-05-31 NOTE — Chronic Care Management (AMB) (Signed)
  Chronic Care Management   Note  05/31/2020 Name: Susan Bennett MRN: [id number] DOB: 1940-07-17  Spoke with patient's daughter, Susan Bennett, by telephone and RN telephone visit rescheduled for 06/09/2020 at 2:00 per daughter's request.   Demetrios Loll, BSN, RN-BC Embedded Chronic Care Manager [facility] Care Management Direct Dial: [phone]

## 2020-06-09 ENCOUNTER — Telehealth: Payer: Medicare Other | Admitting: *Deleted

## 2020-11-15 ENCOUNTER — Other Ambulatory Visit: Payer: Self-pay | Admitting: Family Medicine

## 2020-12-08 ENCOUNTER — Other Ambulatory Visit: Payer: Self-pay | Admitting: Family Medicine

## 2021-03-19 ENCOUNTER — Ambulatory Visit: Payer: Medicare Other

## 2021-03-20 ENCOUNTER — Ambulatory Visit: Payer: Medicare Other

## 2021-03-29 ENCOUNTER — Ambulatory Visit: Payer: Medicare Other

## 2021-07-19 ENCOUNTER — Telehealth: Payer: Self-pay | Admitting: Family Medicine

## 2021-07-19 DIAGNOSIS — I10 Essential (primary) hypertension: Secondary | ICD-10-CM

## 2021-07-20 MED ORDER — BENAZEPRIL HCL 40 MG PO TABS: 40.0000 mg | ORAL_TABLET | Freq: Every day | ORAL | 3 refills | Status: DC

## 2021-07-20 MED ORDER — AMLODIPINE BESYLATE 10 MG PO TABS: 10.0000 mg | ORAL_TABLET | Freq: Every day | ORAL | 3 refills | Status: DC

## 2021-07-20 NOTE — Addendum Note (Signed)
Addended by: Fara Olden on: 07/20/2021 04:04 PM   Modules accepted: Orders

## 2021-07-20 NOTE — Telephone Encounter (Signed)
Has appointment 08/08/21 Can we refill until appointment ?

## 2021-07-20 NOTE — Telephone Encounter (Signed)
Daughter is calling about blood pressure medication being called in. Dr. Reece Agar ok'd one refill and hadn't been called in. Please call.

## 2021-07-20 NOTE — Telephone Encounter (Signed)
Daughter calling to check on medication refill for blood pressure. Last seen was 04-19-2020.

## 2021-07-20 NOTE — Telephone Encounter (Signed)
Ok to fill until appt? 

## 2021-08-02 ENCOUNTER — Encounter: Payer: Self-pay | Admitting: Family Medicine

## 2021-08-02 ENCOUNTER — Ambulatory Visit (INDEPENDENT_AMBULATORY_CARE_PROVIDER_SITE_OTHER): Payer: Medicare Other | Admitting: Family Medicine

## 2021-08-02 VITALS — BP 145/68 | HR 60 | Temp 97.7°F | Ht 64.0 in | Wt 106.0 lb

## 2021-08-02 DIAGNOSIS — R059 Cough, unspecified: Secondary | ICD-10-CM | POA: Diagnosis not present

## 2021-08-02 MED ORDER — AZITHROMYCIN 250 MG PO TABS: ORAL_TABLET | ORAL | 0 refills | Status: AC

## 2021-08-02 MED ORDER — CORICIDIN HBP 10-325-2 MG PO TABS: 1.0000 | ORAL_TABLET | Freq: Four times a day (QID) | ORAL | 0 refills | Status: DC | PRN

## 2021-08-02 NOTE — Progress Notes (Signed)
?  ? ?Subjective:  ?Patient ID: Susan Bennett, female    DOB: July 17, 1940, 81 y.o.   MRN: [id number] ? ?Patient Care Team: ?Raliegh Ip, DO as PCP - General (Family Medicine) ?Gwenith Daily, RN as Case Manager  ? ?Chief Complaint:  Cough ? ? ?HPI: ?Susan Bennett is a 81 y.o. female presenting on 08/02/2021 for Cough ? ? ?Pt presents with 7 day history of cough. She denies fever, chills, body aches. She has been taking Mucinex DM  ? ?Cough ?This is a new problem. The cough is Non-productive. Pertinent negatives include no chest pain, chills, fever, shortness of breath or wheezing. Nothing aggravates the symptoms.  ? ? ?Relevant past medical, surgical, family, and social history reviewed and updated as indicated.  ?Allergies and medications reviewed and updated. Data reviewed: Chart in Epic. ? ? ?Past Medical History:  ?Diagnosis Date  ? Chronic diarrhea   ? Contact dermatitis and other eczema, due to unspecified cause   ? Depressive disorder, not elsewhere classified   ? Osteoporosis, unspecified   ? Other and unspecified hyperlipidemia   ? Other B-complex deficiencies   ? Unspecified essential hypertension   ? Unspecified gastritis and gastroduodenitis without mention of hemorrhage   ? Unspecified vitamin D deficiency   ? ? ?Past Surgical History:  ?Procedure Laterality Date  ? COLONOSCOPY    ? 9 YEARS AGO  ? ? ?Social History  ? ?Socioeconomic History  ? Marital status: Divorced  ?  Spouse name: Not on file  ? Number of children: 1  ? Years of education: Not on file  ? Highest education level: 12th grade  ?Occupational History  ? Occupation: retired  ? Occupation: Advertising account planner  ?  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 Smoking status: Former  ?  Types: Cigarettes  ?  Quit date: 02/20/2001  ?  Years since quitting: 20.4  ? Smokeless tobacco: Never  ?Vaping Use  ? Vaping Use: Never used  ?Substance and Sexual Activity  ? Alcohol use: No  ? Drug use: No  ? Sexual activity: Not Currently  ?Other Topics Concern  ? Not  on file  ?Social History Narrative  ? Lives alone in 1 story home with full basement  ? Has 1 adult child  ? Highest level of education: high school diploma  ? Retired Advertising account planner  ? ?Social Determinants of Health  ? ?Financial Resource Strain: Not on file  ?Food Insecurity: Not on file  ?Transportation Needs: Not on file  ?Physical Activity: Not on file  ?Stress: Not on file  ?Social Connections: Not on file  ?Intimate Partner Violence: Not on file  ? ? ?Outpatient Encounter Medications as of 08/02/2021  ?Medication Sig  ? azithromycin (ZITHROMAX) 250 MG tablet Take 2 tablets on day 1, then 1 tablet daily on days 2 through 5  ? benazepril (LOTENSIN) 40 MG tablet Take 1 tablet (40 mg total) by mouth daily.  ? diclofenac (VOLTAREN) 50 MG EC tablet TAKE 1 TABLET BY MOUTH TWICE DAILY AS NEEDED FOR MODERATE PAIN  ? DM-APAP-CPM (CORICIDIN HBP) 10-325-2 MG TABS Take 1 tablet by mouth every 6 (six) hours as needed (Every 6-8 hours as needed).  ? memantine (NAMENDA) 10 MG tablet TAKE 1/2 (ONE-HALF) TABLET BY MOUTH QAM and 1 tablet every evening.  ? sertraline (ZOLOFT) 50 MG tablet TAKE 1 & 1/2 (ONE & ONE-HALF) TABLETS BY MOUTH ONCE DAILY  ? amLODipine (NORVASC) 10 MG tablet Take 1 tablet (10 mg  total) by mouth daily. (Patient not taking: Reported on 08/02/2021)  ? donepezil (ARICEPT) 5 MG tablet Take 1 tablet (5 mg total) by mouth at bedtime. (Patient not taking: Reported on 08/02/2021)  ? lidocaine (LIDODERM) 5 % Place 1 patch onto the skin daily. Remove & Discard patch within 12 hours or as directed by MD (Patient not taking: Reported on 08/02/2021)  ? [DISCONTINUED] clotrimazole-betamethasone (LOTRISONE) cream Apply topically 2 (two) times daily. x7 days as needed for groin rash  ? ?Facility-Administered Encounter Medications as of 08/02/2021  ?Medication  ? cyanocobalamin ((VITAMIN B-12)) injection 1,000 mcg  ? ? ?No Known Allergies ? ?Review of Systems  ?Constitutional:  Negative for chills and fever.  ?Respiratory:   Positive for cough. Negative for chest tightness, shortness of breath and wheezing.   ?Cardiovascular:  Negative for chest pain.  ?All other systems reviewed and are negative. ? ?   ? ?Objective:  ?BP (!) 145/68   Pulse 60   Temp 97.7 ?F (36.5 ?C) (Temporal)   Ht 5\' 4"  (1.626 m)   Wt 48.1 kg   SpO2 93%   BMI 18.19 kg/m?   ? ?Wt Readings from Last 3 Encounters:  ?08/02/21 48.1 kg  ?04/19/20 47.2 kg  ?12/15/19 48.5 kg  ? ? ?Physical Exam ?Vitals and nursing note reviewed.  ?Constitutional:   ?   Appearance: Normal appearance. She is underweight.  ?Cardiovascular:  ?   Rate and Rhythm: Normal rate and regular rhythm.  ?Pulmonary:  ?   Effort: Pulmonary effort is normal.  ?   Breath sounds: No decreased breath sounds, wheezing or rhonchi.  ?Neurological:  ?   Mental Status: She is alert.  ? ? ?Results for orders placed or performed in visit on 04/19/20  ?Vitamin B12  ?Result Value Ref Range  ? Vitamin B-12 237 232 - 1,245 pg/mL  ? ?   ? ?Pertinent labs & imaging results that were available during my care of the patient were reviewed by me and considered in my medical decision making. ? ?Assessment & Plan:  ?[place] was seen today for cough. ? ?Diagnoses and all orders for this visit: ? ?Cough in adult ?-     DM-APAP-CPM (CORICIDIN HBP) 10-325-2 MG TABS; Take 1 tablet by mouth every 6 (six) hours as needed (Every 6-8 hours as needed). ?-     azithromycin (ZITHROMAX) 250 MG tablet; Take 2 tablets on day 1, then 1 tablet daily on days 2 through 5 ?- Due to comorbidities and age, will treat with Z pack. Patient and family counseled regarding appropriate cough, cold, and flu medications for patients with high blood pressure.  ? ?  ? ?Continue all other maintenance medications. ? ?Follow up plan: ?Follow up with PCP on 3/8 for chronic disease management  ? ? ?Continue healthy lifestyle choices, including diet (rich in fruits, vegetables, and lean proteins, and low in salt and simple carbohydrates) and exercise (at least 30  minutes of moderate physical activity daily). ? ?Educational handout given for URI ? ?The above assessment and management plan was discussed with the patient. The patient verbalized understanding of and has agreed to the management plan. Patient is aware to call the clinic if they develop any new symptoms or if symptoms persist or worsen. Patient is aware when to return to the clinic for a follow-up visit. Patient educated on when it is appropriate to go to the emergency department.  ? ?Addison Jermale Crass, NP-S ? ?I personally was present during the history, physical exam,  and medical decision-making activities of this visit and have verified that the services and findings are accurately documented in the nurse practitioner student's note. ? ?Kari Baars, FNP-C ?[facility] ?[address] ?[place], [place] [address] ?[phone] ? ?

## 2021-08-08 ENCOUNTER — Ambulatory Visit (INDEPENDENT_AMBULATORY_CARE_PROVIDER_SITE_OTHER): Payer: Medicare Other | Admitting: Family Medicine

## 2021-08-08 VITALS — BP 169/74 | HR 56 | Temp 97.4°F | Ht 64.0 in | Wt 107.2 lb

## 2021-08-08 DIAGNOSIS — R630 Anorexia: Secondary | ICD-10-CM | POA: Diagnosis not present

## 2021-08-08 DIAGNOSIS — M545 Low back pain, unspecified: Secondary | ICD-10-CM

## 2021-08-08 DIAGNOSIS — E538 Deficiency of other specified B group vitamins: Secondary | ICD-10-CM

## 2021-08-08 DIAGNOSIS — E441 Mild protein-calorie malnutrition: Secondary | ICD-10-CM | POA: Diagnosis not present

## 2021-08-08 DIAGNOSIS — G309 Alzheimer's disease, unspecified: Secondary | ICD-10-CM | POA: Diagnosis not present

## 2021-08-08 DIAGNOSIS — M25551 Pain in right hip: Secondary | ICD-10-CM

## 2021-08-08 DIAGNOSIS — G8929 Other chronic pain: Secondary | ICD-10-CM | POA: Diagnosis not present

## 2021-08-08 DIAGNOSIS — F0280 Dementia in other diseases classified elsewhere without behavioral disturbance: Secondary | ICD-10-CM | POA: Diagnosis not present

## 2021-08-08 DIAGNOSIS — I10 Essential (primary) hypertension: Secondary | ICD-10-CM | POA: Diagnosis not present

## 2021-08-08 MED ORDER — DONEPEZIL HCL 5 MG PO TABS: 5.0000 mg | ORAL_TABLET | Freq: Every day | ORAL | 3 refills | Status: DC

## 2021-08-08 MED ORDER — LIDOCAINE 5 % EX PTCH: 1.0000 | MEDICATED_PATCH | CUTANEOUS | 3 refills | Status: DC

## 2021-08-08 MED ORDER — MEMANTINE HCL 10 MG PO TABS: ORAL_TABLET | ORAL | 3 refills | Status: DC

## 2021-08-08 MED ORDER — DICLOFENAC SODIUM 50 MG PO TBEC: DELAYED_RELEASE_TABLET | ORAL | 1 refills | Status: DC

## 2021-08-08 MED ORDER — MIRTAZAPINE 7.5 MG PO TABS: 7.5000 mg | ORAL_TABLET | Freq: Every day | ORAL | 1 refills | Status: DC

## 2021-08-08 NOTE — Progress Notes (Signed)
? ?Subjective: ?CC: Chronic follow-up ?PCP: Janora Norlander, DO ?UKG:URKY Susan Bennett is a 81 y.o. female who has been lost to follow-up since November 2021.  At that time blood pressure was well controlled but there was questionable compliance with her dementia medicines. ? ?1.  Hypertension ?Patient is accompanied today's visit by her family member.  She is currently being treated with benazepril 40 mg daily and was prescribed Norvasc 10 mg daily as well but they are not aware of this being in her medications.  Her blood pressures have been elevated at home and this has brought concern from her daughter Neoma Laming.  Patient reports no headache, chest pain, shortness of breath, dizziness or edema. ? ?2.  Arthritis/history of B12 deficiency ?Patient with chronic arthritis, but specifically in her back.  She needs refills on her Lidoderm patch, Voltaren tablets.  She is currently taking 500 mg of Tylenol daily and they wanted to know if this is okay to do along with the Voltaren.  She suffers from neuropathy and has had issues with B12 deficiency in the past.  Her November 2021 B12 was low end of normal and sublingual B12 was recommended.  However, unclear if this is something that she is actively taking as it is not amongst her medications that are brought to the office today. ? ?3.  Dementia ?Patient is treated with Namenda half tablet each morning and 1 tablet each evening.  She is not being treated with the Aricept anymore but they are unsure as to why this was discontinued.  They would like to restart the Aricept. ? ?4.  Poor appetite, depression ?Her daughter reports that her mother has not been eating much.  She also does not really drink much water.  She is encouraged to try and drink water but sometimes forgets to.  She is not a big fan of water.  She is currently treated with Zoloft for depression and anxiety and things have been somewhat stable with that.  However, they really want something that will  simulate her appetite ? ? ? ?ROS: Per HPI ? ?No Known Allergies ?Past Medical History:  ?Diagnosis Date  ? Chronic diarrhea   ? Contact dermatitis and other eczema, due to unspecified cause   ? Depressive disorder, not elsewhere classified   ? Osteoporosis, unspecified   ? Other and unspecified hyperlipidemia   ? Other B-complex deficiencies   ? Unspecified essential hypertension   ? Unspecified gastritis and gastroduodenitis without mention of hemorrhage   ? Unspecified vitamin D deficiency   ? ? ?Current Outpatient Medications:  ?  benazepril (LOTENSIN) 40 MG tablet, Take 1 tablet (40 mg total) by mouth daily., Disp: 90 tablet, Rfl: 3 ?  DM-APAP-CPM (CORICIDIN HBP) 10-325-2 MG TABS, Take 1 tablet by mouth every 6 (six) hours as needed (Every 6-8 hours as needed)., Disp: 30 tablet, Rfl: 0 ?  lidocaine (LIDODERM) 5 %, Place 1 patch onto the skin daily. Remove & Discard patch within 12 hours or as directed by MD, Disp: 30 patch, Rfl: 3 ?  memantine (NAMENDA) 10 MG tablet, TAKE 1/2 (ONE-HALF) TABLET BY MOUTH QAM and 1 tablet every evening., Disp: 135 tablet, Rfl: 3 ?  sertraline (ZOLOFT) 50 MG tablet, TAKE 1 & 1/2 (ONE & ONE-HALF) TABLETS BY MOUTH ONCE DAILY, Disp: 135 tablet, Rfl: 3 ?  amLODipine (NORVASC) 10 MG tablet, Take 1 tablet (10 mg total) by mouth daily. (Patient not taking: Reported on 08/08/2021), Disp: 90 tablet, Rfl: 3 ?  diclofenac (  VOLTAREN) 50 MG EC tablet, TAKE 1 TABLET BY MOUTH TWICE DAILY AS NEEDED FOR MODERATE PAIN (Patient not taking: Reported on 08/08/2021), Disp: 60 tablet, Rfl: 0 ?  donepezil (ARICEPT) 5 MG tablet, Take 1 tablet (5 mg total) by mouth at bedtime. (Patient not taking: Reported on 08/08/2021), Disp: 90 tablet, Rfl: 3 ? ?Current Facility-Administered Medications:  ?  cyanocobalamin ((VITAMIN B-12)) injection 1,000 mcg, 1,000 mcg, Intramuscular, Q30 days, Particia Nearing S, PA-C, 1,000 mcg at 12/15/19 1558 ?Social History  ? ?Socioeconomic History  ? Marital status: Divorced  ?  Spouse  name: Not on file  ? Number of children: 1  ? Years of education: Not on file  ? Highest education level: 12th grade  ?Occupational History  ? Occupation: retired  ? Occupation: Medical illustrator  ?  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 Smoking status: Former  ?  Types: Cigarettes  ?  Quit date: 02/20/2001  ?  Years since quitting: 20.4  ? Smokeless tobacco: Never  ?Vaping Use  ? Vaping Use: Never used  ?Substance and Sexual Activity  ? Alcohol use: No  ? Drug use: No  ? Sexual activity: Not Currently  ?Other Topics Concern  ? Not on file  ?Social History Narrative  ? Lives alone in 1 story home with full basement  ? Has 1 adult child  ? Highest level of education: high school diploma  ? Retired Medical illustrator  ? ?Social Determinants of Health  ? ?Financial Resource Strain: Not on file  ?Food Insecurity: Not on file  ?Transportation Needs: Not on file  ?Physical Activity: Not on file  ?Stress: Not on file  ?Social Connections: Not on file  ?Intimate Partner Violence: Not on file  ? ?Family History  ?Problem Relation Age of Onset  ? Healthy Sister   ? Healthy Brother   ? Throat cancer Brother   ? Stroke Brother   ? Heart disease Brother   ? Throat cancer Maternal Aunt   ? ? ?Objective: ?Office vital signs reviewed. ?BP (!) 169/74   Pulse (!) 56   Temp (!) 97.4 ?F (36.3 ?C) (Temporal)   Ht _0  (1.626 m)   Wt 107 lb 3.2 oz (48.6 kg)   SpO2 96%   BMI 18.40 kg/m?  ? ?Physical Examination:  ?General: Awake, alert, thin, elderly appearing female, No acute distress ?HEENT: Sclera white.  Moist mucous membranes ?Cardio: regular rate and rhythm, S1S2 heard, no murmurs appreciated ?Pulm: clear to auscultation bilaterally, no wheezes, rhonchi or rales; normal work of breathing on room air ?MSK: Decreased tone ? ?Assessment/ Plan: ?81 y.o. female  ? ?Alzheimer's dementia without behavioral disturbance (Susan Bennett) - Plan: donepezil (ARICEPT) 5 MG tablet, memantine (NAMENDA) 10 MG tablet ? ?Vitamin B12 deficiency - Plan: Vitamin  B12, Vitamin B12 ? ?Essential hypertension - Plan: CMP14+EGFR, CMP14+EGFR ? ?Malnutrition of mild degree (Susan Bennett) ? ?Decreased appetite - Plan: Vitamin B12, CBC, TSH, CMP14+EGFR, mirtazapine (REMERON) 7.5 MG tablet, CMP14+EGFR, TSH, CBC, Vitamin B12 ? ?Chronic right-sided low back pain without sciatica - Plan: lidocaine (LIDODERM) 5 %, diclofenac (VOLTAREN) 50 MG EC tablet ? ?Hip pain, chronic, right - Plan: lidocaine (LIDODERM) 5 %, diclofenac (VOLTAREN) 50 MG EC tablet ? ?We discussed that dementia is a progressive disease.  However, it sounds like her Aricept has somehow fallen off of her medication list.  I have readded this back at 5 mg.  She will continue Namenda at current dosing. ? ?B12 deficiency history.  B12 ordered. ? ?Blood  pressure is not controlled but it sounds like she has not retrieved the amlodipine that was sent back in February.  She has previously had good control of blood pressure with dual therapy.  Recommend retrieving this medication and monitoring blood pressures closely at home ? ?I suspect decreased appetite is related to advanced age, dementia.  We discussed ways to increase more water and I have advised them to get a water bottle which has times on it so that she knows when to drink.  Consider carbonated water if she prefers sodas, ice pops.  We will start mirtazapine and taper her off of the Zoloft.  Hopefully we can maintain control of depression, anxiety with the mirtazapine while still allowing for improvement in appetite.  Her BMI currently is technically in the underweight category at 18. ? ?I have renewed her Voltaren, lidocaine patches and we discussed that she may use Tylenol arthritis up to 3 times daily if needed.  In fact I would prefer her to rely on the Tylenol over ibuprofen, diclofenac or Aleve due to use of the antidepressant and overall increased risk of GI bleeding in this octogenarian ? ?No orders of the defined types were placed in this encounter. ? ?No orders of the  defined types were placed in this encounter. ? ? ? ?Janora Norlander, DO ?[place] ?[phone] ? ?

## 2021-08-08 NOTE — Patient Instructions (Addendum)
You had labs performed today.  You will be contacted with the results of the labs once they are available, usually in the next 3 business days for routine lab work.  If you have an active my chart account, they will be released to your MyChart.  If you prefer to have these labs released to you via telephone, please let us know. ?  ?REDUCE Sertraline to 1 tablet daily for 1 week. THEN STOP ?START Mirtazapine TOMORROW.  Take every day ?She SHOULD be on Amlodipine for blood pressure as well. It was sent in February.  Please check cabinet ? ?  ?

## 2021-08-09 LAB — CBC
Hematocrit: 37.7 % (ref 34.0–46.6)
Hemoglobin: 12.6 g/dL (ref 11.1–15.9)
MCH: 31.3 pg (ref 26.6–33.0)
MCHC: 33.4 g/dL (ref 31.5–35.7)
MCV: 94 fL (ref 79–97)
Platelets: 232 10*3/uL (ref 150–450)
RBC: 4.02 x10E6/uL (ref 3.77–5.28)
RDW: 13.1 % (ref 11.7–15.4)
WBC: 7.6 10*3/uL (ref 3.4–10.8)

## 2021-08-09 LAB — TSH: TSH: 1.29 u[IU]/mL (ref 0.450–4.500)

## 2021-08-09 LAB — CMP14+EGFR
ALT: 6 IU/L (ref 0–32)
AST: 12 IU/L (ref 0–40)
Albumin/Globulin Ratio: 1.7 (ref 1.2–2.2)
Albumin: 4 g/dL (ref 3.6–4.6)
Alkaline Phosphatase: 119 IU/L (ref 44–121)
BUN/Creatinine Ratio: 14 (ref 12–28)
BUN: 15 mg/dL (ref 8–27)
Bilirubin Total: 0.2 mg/dL (ref 0.0–1.2)
CO2: 23 mmol/L (ref 20–29)
Calcium: 9.5 mg/dL (ref 8.7–10.3)
Chloride: 105 mmol/L (ref 96–106)
Creatinine, Ser: 1.08 mg/dL — ABNORMAL HIGH (ref 0.57–1.00)
Globulin, Total: 2.3 g/dL (ref 1.5–4.5)
Glucose: 96 mg/dL (ref 70–99)
Potassium: 4.2 mmol/L (ref 3.5–5.2)
Sodium: 143 mmol/L (ref 134–144)
Total Protein: 6.3 g/dL (ref 6.0–8.5)
eGFR: 51 mL/min/{1.73_m2} — ABNORMAL LOW (ref >59)

## 2021-08-09 LAB — VITAMIN B12: Vitamin B-12: 217 pg/mL — ABNORMAL LOW (ref 232–1245)

## 2021-08-10 ENCOUNTER — Telehealth: Payer: Self-pay | Admitting: Family Medicine

## 2021-08-10 NOTE — Telephone Encounter (Signed)
Already called and spoke to daughter please refer to lab result. ?

## 2021-08-15 ENCOUNTER — Other Ambulatory Visit: Payer: Self-pay | Admitting: *Deleted

## 2021-08-15 ENCOUNTER — Telehealth: Payer: Self-pay | Admitting: *Deleted

## 2021-08-15 ENCOUNTER — Ambulatory Visit (INDEPENDENT_AMBULATORY_CARE_PROVIDER_SITE_OTHER): Payer: Medicare Other

## 2021-08-15 DIAGNOSIS — G8929 Other chronic pain: Secondary | ICD-10-CM

## 2021-08-15 DIAGNOSIS — E538 Deficiency of other specified B group vitamins: Secondary | ICD-10-CM | POA: Diagnosis not present

## 2021-08-15 DIAGNOSIS — M545 Low back pain, unspecified: Secondary | ICD-10-CM

## 2021-08-15 NOTE — Progress Notes (Signed)
Cyanocobalamin injection given to left deltoid.  Patient tolerated well. 

## 2021-08-15 NOTE — Telephone Encounter (Signed)
Drug ?Lidocaine 5% patches ?Form ?[employer] Electronic PA Form ?KeyCecille Aver - PA Case ID: [id number] - Rx #: [id number] ? ?Send to plan ? ?Approvedtoday ?PA Case: [id number], Status: Approved, Coverage Starts on: 06/03/2021 12:00:00 AM, Coverage Ends on: 06/02/2022 12:00:00 AM. Questions? Contact [phone]. ?

## 2021-08-15 NOTE — Telephone Encounter (Signed)
Pharmacy aware

## 2021-08-22 ENCOUNTER — Ambulatory Visit (INDEPENDENT_AMBULATORY_CARE_PROVIDER_SITE_OTHER): Payer: Medicare Other

## 2021-08-22 DIAGNOSIS — E538 Deficiency of other specified B group vitamins: Secondary | ICD-10-CM | POA: Diagnosis not present

## 2021-08-22 NOTE — Progress Notes (Signed)
Cyanocobalamin injection given to right deltoid.  Patient tolerated well. 

## 2021-08-29 ENCOUNTER — Ambulatory Visit (INDEPENDENT_AMBULATORY_CARE_PROVIDER_SITE_OTHER): Payer: Medicare Other | Admitting: *Deleted

## 2021-08-29 DIAGNOSIS — E538 Deficiency of other specified B group vitamins: Secondary | ICD-10-CM

## 2021-08-29 MED ORDER — CYANOCOBALAMIN 1000 MCG/ML IJ SOLN
1000.0000 ug | Freq: Once | INTRAMUSCULAR | Status: AC
  Administered 2021-08-29: 1000 ug via INTRAMUSCULAR

## 2021-09-05 ENCOUNTER — Ambulatory Visit: Payer: Medicare Other

## 2021-09-12 ENCOUNTER — Ambulatory Visit (INDEPENDENT_AMBULATORY_CARE_PROVIDER_SITE_OTHER): Payer: Medicare Other | Admitting: Family Medicine

## 2021-09-12 ENCOUNTER — Ambulatory Visit: Payer: Medicare Other

## 2021-09-12 ENCOUNTER — Encounter: Payer: Self-pay | Admitting: Family Medicine

## 2021-09-12 VITALS — BP 156/78 | HR 53 | Temp 97.6°F | Ht 64.0 in | Wt 107.0 lb

## 2021-09-12 DIAGNOSIS — G309 Alzheimer's disease, unspecified: Secondary | ICD-10-CM

## 2021-09-12 DIAGNOSIS — R630 Anorexia: Secondary | ICD-10-CM | POA: Diagnosis not present

## 2021-09-12 DIAGNOSIS — M25551 Pain in right hip: Secondary | ICD-10-CM | POA: Diagnosis not present

## 2021-09-12 DIAGNOSIS — E441 Mild protein-calorie malnutrition: Secondary | ICD-10-CM | POA: Diagnosis not present

## 2021-09-12 DIAGNOSIS — E538 Deficiency of other specified B group vitamins: Secondary | ICD-10-CM | POA: Diagnosis not present

## 2021-09-12 DIAGNOSIS — F0280 Dementia in other diseases classified elsewhere without behavioral disturbance: Secondary | ICD-10-CM | POA: Diagnosis not present

## 2021-09-12 MED ORDER — METHYLPREDNISOLONE ACETATE 40 MG/ML IJ SUSP
40.0000 mg | Freq: Once | INTRAMUSCULAR | Status: AC
  Administered 2021-09-12: 40 mg via INTRAMUSCULAR

## 2021-09-12 NOTE — Patient Instructions (Addendum)
Will reach out to Debra in the next couple of days about the Mirtazapine.  Not sure what the hesitation is, as we use this VERY frequently in the geriatric population to help with sleep and appetite.  There really isn't anything else like it. ? ?You had a steroid shot today to help with some of the pain.  OK to use the diclofenac SPARINGLY if needed for arthritis.  You do have some kidney impairment and frequent use of this type of medication CAN worse kidney function ? ?B12 shot administered today.  Next month will start with ONCE monthly shots. ? ?When you go for your THIRD month's shot, GET THE BLOOD WORK DONE FIRST! ?Depending on what that blood work looks like, will let us know if we can transition to pills vs keep you on a monthly shot. ?

## 2021-09-12 NOTE — Progress Notes (Signed)
? ?Subjective: ?CC: Follow-up weight, sleep, dementia ?PCP: Raliegh IpGottschalk, Alpha Chouinard M, DO ?ZOX:[id number]:Susan Bennett is a 81 y.o. female presenting to clinic today for: ? ?1.  Poor appetite ?Patient was seen about 6 weeks ago.  At that time she had reported some uncontrolled depression, poor appetite.  She was being treated with Zoloft for depression but the request of her daughter was to try and get her switched over to something that might stimulate her appetite.  We in fact did that and recommended a taper from the Zoloft and onto mirtazapine.  However, her caregiver brings her to the office today and notes that the patient was never started on the mirtazapine and continues to take the Zoloft.  These are for unknown reasons.  After asking the patient about this she is fine taking the mirtazapine so not quite sure why this did not get switched over, as the after visit summary that was sent was very clear about reducing the sertraline and starting the mirtazapine.  Additionally, it was recommended for her to resume use of the amlodipine for blood pressure, which has been grossly uncontrolled and remains uncontrolled today.  This apparently is not being given either despite the very clear after visit summary in March ? ?2.  Chronic hip pain ?Patient was getting hip injections with [place] orthopedic in [place].  This appears to be Dr. Rikki SpearingWhitfield/[place]'s office.  Though the patient does not recall the actual doctor who used to give her these injections.  She is interested in resuming injection therapy as her pain has been uncontrolled with Tylenol alone.  She has not been getting any of the diclofenac even though the Tylenol has been ineffective and again the prescription indicates she may use this sparingly (in the setting of very mild renal dysfunction) for severe uncontrolled back pain.  She is asking for corticosteroid injection today and to potentially be referred to a local orthopedist should she need ongoing injection  therapy ? ?3.  Dementia/B12 deficiency ?Memory remains pretty poor and it is not felt that the Namenda nor Aricept are working though patient is compliant with them.  It sounds like there was an expectation that these medications would somehow reverse her memory loss when in fact they are only meant to delay ongoing memory loss. ? ?She reports feeling better on the B12 injections and states that she just has more energy.  She does not necessarily state any changes in balance or memory but does overall feel better on the B12 and wishes to continue as directed ? ?ROS: Per HPI ? ?No Known Allergies ?Past Medical History:  ?Diagnosis Date  ? Chronic diarrhea   ? Contact dermatitis and other eczema, due to unspecified cause   ? Depressive disorder, not elsewhere classified   ? Osteoporosis, unspecified   ? Other and unspecified hyperlipidemia   ? Other B-complex deficiencies   ? Unspecified essential hypertension   ? Unspecified gastritis and gastroduodenitis without mention of hemorrhage   ? Unspecified vitamin D deficiency   ? ? ?Current Outpatient Medications:  ?  benazepril (LOTENSIN) 40 MG tablet, Take 1 tablet (40 mg total) by mouth daily., Disp: 90 tablet, Rfl: 3 ?  diclofenac (VOLTAREN) 50 MG EC tablet, TAKE 1 TABLET BY MOUTH TWICE DAILY AS NEEDED FOR MODERATE PAIN. ONLY use IF Tylenol not working, Disp: 60 tablet, Rfl: 1 ?  DM-APAP-CPM (CORICIDIN HBP) 10-325-2 MG TABS, Take 1 tablet by mouth every 6 (six) hours as needed (Every 6-8 hours as needed)., Disp: 30  tablet, Rfl: 0 ?  donepezil (ARICEPT) 5 MG tablet, Take 1 tablet (5 mg total) by mouth at bedtime. For memory, Disp: 90 tablet, Rfl: 3 ?  lidocaine (LIDODERM) 5 %, Place 1 patch onto the skin daily. Remove & Discard patch within 12 hours or as directed by MD, Disp: 30 patch, Rfl: 3 ?  memantine (NAMENDA) 10 MG tablet, TAKE 1/2 (ONE-HALF) TABLET BY MOUTH QAM and 1 tablet every evening., Disp: 135 tablet, Rfl: 3 ?  mirtazapine (REMERON) 15 MG tablet, Take by  mouth., Disp: , Rfl:  ? ?Current Facility-Administered Medications:  ?  cyanocobalamin ((VITAMIN B-12)) injection 1,000 mcg, 1,000 mcg, Intramuscular, Q30 days, Prudy Feeler S, PA-C, 1,000 mcg at 08/22/21 1514 ?Social History  ? ?Socioeconomic History  ? Marital status: Divorced  ?  Spouse name: Not on file  ? Number of children: 1  ? Years of education: Not on file  ? Highest education level: 12th grade  ?Occupational History  ? Occupation: retired  ? Occupation: Advertising account planner  ?  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 Smoking status: Former  ?  Types: Cigarettes  ?  Quit date: 02/20/2001  ?  Years since quitting: 20.5  ? Smokeless tobacco: Never  ?Vaping Use  ? Vaping Use: Never used  ?Substance and Sexual Activity  ? Alcohol use: No  ? Drug use: No  ? Sexual activity: Not Currently  ?Other Topics Concern  ? Not on file  ?Social History Narrative  ? Lives alone in 1 story home with full basement  ? Has 1 adult child  ? Highest level of education: high school diploma  ? Retired Advertising account planner  ? ?Social Determinants of Health  ? ?Financial Resource Strain: Not on file  ?Food Insecurity: Not on file  ?Transportation Needs: Not on file  ?Physical Activity: Not on file  ?Stress: Not on file  ?Social Connections: Not on file  ?Intimate Partner Violence: Not on file  ? ?Family History  ?Problem Relation Age of Onset  ? Healthy Sister   ? Healthy Brother   ? Throat cancer Brother   ? Stroke Brother   ? Heart disease Brother   ? Throat cancer Maternal Aunt   ? ? ?Objective: ?Office vital signs reviewed. ?BP (!) 156/78   Pulse (!) 53   Temp 97.6 ?F (36.4 ?C)   Ht 5\' 4"  (1.626 m)   Wt 107 lb (48.5 kg)   SpO2 95%   BMI 18.37 kg/m?  ? ?Physical Examination:  ?General: Awake, alert, thin female, No acute distress ?HEENT: Sclera white.  Moist mucous membranes ?Cardio: Slightly bradycardic with regular rhythm, S1S2 heard, no murmurs appreciated ?Pulm: clear to auscultation bilaterally, no wheezes, rhonchi or rales; normal  work of breathing on room air ?MSK: Ambulates with minimal assistance. ?Neuro: Follows commands.  Short-term memory is impaired ?Psych: Very pleasant, interactive female.  Does not appear depressed or anxious.  Frequently smiling during exam ? ? ?  09/12/2021  ?  4:26 PM 04/19/2020  ?  1:48 PM 12/15/2019  ?  1:31 PM  ?Depression screen PHQ 2/9  ?Decreased Interest 2 0 1  ?Down, Depressed, Hopeless 0 0 1  ?PHQ - 2 Score 2 0 2  ?Altered sleeping 0 0 0  ?Tired, decreased energy 1 0 1  ?Change in appetite 0 0 0  ?Feeling bad or failure about yourself  0 0 0  ?Trouble concentrating 0 0 1  ?Moving slowly or fidgety/restless 0 0 0  ?Suicidal  thoughts 0 0 0  ?PHQ-9 Score 3 0 4  ?Difficult doing work/chores Not difficult at all  Not difficult at all  ? ? ? ?Assessment/ Plan: ?81 y.o. female  ? ?Alzheimer's dementia without behavioral disturbance (HCC) ? ?Malnutrition of mild degree (HCC) ? ?Decreased appetite ? ?Vitamin B12 deficiency - Plan: Vitamin B12 ? ?Pain of right hip - Plan: methylPREDNISolone acetate (DEPO-MEDROL) injection 40 mg ? ?I again reiterated that the medications for Alzheimer's dementia are not intended to reverse any memory loss but in fact intended to delay or stall progression of this disease.  My hopes are that we will see a little improvement in her up with the B12 repletion.  She certainly seems brighter and more interactive on exam today.  We will continue these B12 injections monthly for the next 3 months.  Her fourth weekly B12 injection was administered today.  Before third month injection we will plan to get a B12 level and I reiterated this again on the AVS that this needs to be collected prior to the administration of that B12. ? ?Uncertain as to why patient was not started on the mirtazapine as her decreased appetite was something of concern by her daughter, who manages her medications.  I will plan to reach out to her daughter to discuss further as I do think this medication would help her but  she certainly cannot take the Zoloft and mirtazapine at the same time as she would be at too high of a risk for serotonin syndrome. ? ?For her hip pain, which is likely related to her low back issues, Depo 40 mg i

## 2021-09-13 ENCOUNTER — Telehealth: Payer: Self-pay | Admitting: Family Medicine

## 2021-09-13 NOTE — Telephone Encounter (Signed)
Pts daughter called stating that she got Dr Alver Sorrow message and wanted to let her know that the reason patient is not taking Mirtazapine is because the medicine made pt very emotional/nervous and also caused her to have bad dreams and not sleep well. Says she deff does need to be on something to help increase her appetite so if something else can be prescribed to pt then she will give it to pt to try and see if it works better.  ? ?Please advise and call daughter.  ?

## 2021-09-13 NOTE — Telephone Encounter (Signed)
I see.  That makes sense now.  Unfortunately, no other meds that are in my wheelhouse to prescribe her for appetite stimulation.  Just try and keep up with ensure/ boost drinks along with whatever she likes to eat. ?

## 2021-09-14 DIAGNOSIS — F0280 Dementia in other diseases classified elsewhere without behavioral disturbance: Secondary | ICD-10-CM | POA: Diagnosis not present

## 2021-09-14 DIAGNOSIS — E441 Mild protein-calorie malnutrition: Secondary | ICD-10-CM | POA: Diagnosis not present

## 2021-09-14 DIAGNOSIS — M25551 Pain in right hip: Secondary | ICD-10-CM | POA: Diagnosis not present

## 2021-09-14 DIAGNOSIS — G309 Alzheimer's disease, unspecified: Secondary | ICD-10-CM | POA: Diagnosis not present

## 2021-09-14 DIAGNOSIS — E538 Deficiency of other specified B group vitamins: Secondary | ICD-10-CM | POA: Diagnosis not present

## 2021-09-14 DIAGNOSIS — R630 Anorexia: Secondary | ICD-10-CM | POA: Diagnosis not present

## 2021-09-14 NOTE — Telephone Encounter (Signed)
Lmtcb.

## 2021-09-17 ENCOUNTER — Telehealth: Payer: Self-pay

## 2021-09-17 NOTE — Progress Notes (Signed)
lmtcb

## 2021-09-17 NOTE — Telephone Encounter (Signed)
lmtcb

## 2021-09-18 NOTE — Telephone Encounter (Signed)
Contacted patients daughter and advised.

## 2021-10-03 ENCOUNTER — Other Ambulatory Visit: Payer: Self-pay | Admitting: Family Medicine

## 2021-10-03 ENCOUNTER — Telehealth: Payer: Self-pay | Admitting: Family Medicine

## 2021-10-03 DIAGNOSIS — F32A Depression, unspecified: Secondary | ICD-10-CM

## 2021-10-03 MED ORDER — SERTRALINE HCL 50 MG PO TABS: 75.0000 mg | ORAL_TABLET | Freq: Every day | ORAL | 3 refills | Status: AC

## 2021-10-03 NOTE — Telephone Encounter (Signed)
Deborah aware

## 2021-10-03 NOTE — Telephone Encounter (Signed)
Patients daughter calling to see if we can send Sertraline to [facility] in [place]. This is not on the patient medication list. Please call back.  ?

## 2021-10-03 NOTE — Telephone Encounter (Signed)
done

## 2021-10-10 ENCOUNTER — Ambulatory Visit: Payer: Medicare Other

## 2021-10-10 ENCOUNTER — Telehealth: Payer: Self-pay | Admitting: Family Medicine

## 2021-10-10 NOTE — Telephone Encounter (Signed)
She does not since her daughter said Mirtazapine didn't agree with her so we just restarted Zoloft ?

## 2021-10-10 NOTE — Telephone Encounter (Signed)
Susan Bennett says she did not call the office so she is unsure on what any of this is even about. I explained to pt daughter what was going on and she verbalized understanding that she will call back to schedule b12 when she understand what is going on from her moms perspective.  ?

## 2021-10-17 ENCOUNTER — Ambulatory Visit: Payer: Medicare Other | Admitting: Family Medicine

## 2021-10-18 ENCOUNTER — Telehealth: Payer: Self-pay | Admitting: Family Medicine

## 2021-10-18 DIAGNOSIS — I10 Essential (primary) hypertension: Secondary | ICD-10-CM

## 2021-10-18 MED ORDER — BENAZEPRIL HCL 40 MG PO TABS: 40.0000 mg | ORAL_TABLET | Freq: Every day | ORAL | 2 refills | Status: DC

## 2021-10-18 NOTE — Telephone Encounter (Signed)
Aware Benazepril sent to [facility]

## 2021-10-24 ENCOUNTER — Ambulatory Visit: Payer: Medicare Other

## 2021-10-31 ENCOUNTER — Ambulatory Visit (INDEPENDENT_AMBULATORY_CARE_PROVIDER_SITE_OTHER): Payer: Medicare Other | Admitting: *Deleted

## 2021-10-31 ENCOUNTER — Ambulatory Visit (INDEPENDENT_AMBULATORY_CARE_PROVIDER_SITE_OTHER): Payer: Medicare Other

## 2021-10-31 VITALS — Ht 64.0 in | Wt 107.0 lb

## 2021-10-31 DIAGNOSIS — Z0000 Encounter for general adult medical examination without abnormal findings: Secondary | ICD-10-CM

## 2021-10-31 DIAGNOSIS — E538 Deficiency of other specified B group vitamins: Secondary | ICD-10-CM

## 2021-10-31 NOTE — Patient Instructions (Signed)
Susan Bennett , Thank you for taking time to come for your Medicare Wellness Visit. I appreciate your ongoing commitment to your health goals. Please review the following plan we discussed and let me know if I can assist you in the future.   Screening recommendations/referrals: Colonoscopy: No longer required. Mammogram: No longer required. Bone Density: Done 02/13/2011.  Recommended yearly ophthalmology/optometry visit for glaucoma screening and checkup Recommended yearly dental visit for hygiene and checkup  Vaccinations: Influenza vaccine: Due Fall 2023. Pneumococcal vaccine: Discussed. Tdap vaccine: Due every 10 years. Shingles vaccine: Discussed.   Covid-19:Declined.  Advanced directives: Please bring a copy of your health care power of attorney and living will to the office to be added to your chart at your convenience.   Conditions/risks identified: Aim for 6-8 glasses of water, and 5 servings of fruits and vegetables each day.   Next appointment: Follow up in one year for your annual wellness visit 2024.   Preventive Care 83 Years and Older, Female Preventive care refers to lifestyle choices and visits with your health care provider that can promote health and wellness. What does preventive care include? A yearly physical exam. This is also called an annual well check. Dental exams once or twice a year. Routine eye exams. Ask your health care provider how often you should have your eyes checked. Personal lifestyle choices, including: Daily care of your teeth and gums. Regular physical activity. Eating a healthy diet. Avoiding tobacco and drug use. Limiting alcohol use. Practicing safe sex. Taking low-dose aspirin every day. Taking vitamin and mineral supplements as recommended by your health care provider. What happens during an annual well check? The services and screenings done by your health care provider during your annual well check will depend on your age, overall  health, lifestyle risk factors, and family history of disease. Counseling  Your health care provider may ask you questions about your: Alcohol use. Tobacco use. Drug use. Emotional well-being. Home and relationship well-being. Sexual activity. Eating habits. History of falls. Memory and ability to understand (cognition). Work and work Astronomer. Reproductive health. Screening  You may have the following tests or measurements: Height, weight, and BMI. Blood pressure. Lipid and cholesterol levels. These may be checked every 5 years, or more frequently if you are over 83 years old. Skin check. Lung cancer screening. You may have this screening every year starting at age 83 if you have a 30-pack-year history of smoking and currently smoke or have quit within the past 15 years. Fecal occult blood test (FOBT) of the stool. You may have this test every year starting at age 83. Flexible sigmoidoscopy or colonoscopy. You may have a sigmoidoscopy every 5 years or a colonoscopy every 10 years starting at age 83. Hepatitis C blood test. Hepatitis B blood test. Sexually transmitted disease (STD) testing. Diabetes screening. This is done by checking your blood sugar (glucose) after you have not eaten for a while (fasting). You may have this done every 1-3 years. Bone density scan. This is done to screen for osteoporosis. You may have this done starting at age 83. Mammogram. This may be done every 1-2 years. Talk to your health care provider about how often you should have regular mammograms. Talk with your health care provider about your test results, treatment options, and if necessary, the need for more tests. Vaccines  Your health care provider may recommend certain vaccines, such as: Influenza vaccine. This is recommended every year. Tetanus, diphtheria, and acellular pertussis (Tdap, Td) vaccine. You  may need a Td booster every 10 years. Zoster vaccine. You may need this after age  83. Pneumococcal 13-valent conjugate (PCV13) vaccine. One dose is recommended after age 83. Pneumococcal polysaccharide (PPSV23) vaccine. One dose is recommended after age 83. Talk to your health care provider about which screenings and vaccines you need and how often you need them. This information is not intended to replace advice given to you by your health care provider. Make sure you discuss any questions you have with your health care provider. Document Released: 06/16/2015 Document Revised: 02/07/2016 Document Reviewed: 03/21/2015 Elsevier Interactive Patient Education  2017 [place] Prevention in the Home Falls can cause injuries. They can happen to people of all ages. There are many things you can do to make your home safe and to help prevent falls. What can I do on the outside of my home? Regularly fix the edges of walkways and driveways and fix any cracks. Remove anything that might make you trip as you walk through a door, such as a raised step or threshold. Trim any bushes or trees on the path to your home. Use bright outdoor lighting. Clear any walking paths of anything that might make someone trip, such as rocks or tools. Regularly check to see if handrails are loose or broken. Make sure that both sides of any steps have handrails. Any raised decks and porches should have guardrails on the edges. Have any leaves, snow, or ice cleared regularly. Use sand or salt on walking paths during winter. Clean up any spills in your garage right away. This includes oil or grease spills. What can I do in the bathroom? Use night lights. Install grab bars by the toilet and in the tub and shower. Do not use towel bars as grab bars. Use non-skid mats or decals in the tub or shower. If you need to sit down in the shower, use a plastic, non-slip stool. Keep the floor dry. Clean up any water that spills on the floor as soon as it happens. Remove soap buildup in the tub or shower  regularly. Attach bath mats securely with double-sided non-slip rug tape. Do not have throw rugs and other things on the floor that can make you trip. What can I do in the bedroom? Use night lights. Make sure that you have a light by your bed that is easy to reach. Do not use any sheets or blankets that are too big for your bed. They should not hang down onto the floor. Have a firm chair that has side arms. You can use this for support while you get dressed. Do not have throw rugs and other things on the floor that can make you trip. What can I do in the kitchen? Clean up any spills right away. Avoid walking on wet floors. Keep items that you use a lot in easy-to-reach places. If you need to reach something above you, use a strong step stool that has a grab bar. Keep electrical cords out of the way. Do not use floor polish or wax that makes floors slippery. If you must use wax, use non-skid floor wax. Do not have throw rugs and other things on the floor that can make you trip. What can I do with my stairs? Do not leave any items on the stairs. Make sure that there are handrails on both sides of the stairs and use them. Fix handrails that are broken or loose. Make sure that handrails are as long as the  stairways. Check any carpeting to make sure that it is firmly attached to the stairs. Fix any carpet that is loose or worn. Avoid having throw rugs at the top or bottom of the stairs. If you do have throw rugs, attach them to the floor with carpet tape. Make sure that you have a light switch at the top of the stairs and the bottom of the stairs. If you do not have them, ask someone to add them for you. What else can I do to help prevent falls? Wear shoes that: Do not have high heels. Have rubber bottoms. Are comfortable and fit you well. Are closed at the toe. Do not wear sandals. If you use a stepladder: Make sure that it is fully opened. Do not climb a closed stepladder. Make sure that  both sides of the stepladder are locked into place. Ask someone to hold it for you, if possible. Clearly mark and make sure that you can see: Any grab bars or handrails. First and last steps. Where the edge of each step is. Use tools that help you move around (mobility aids) if they are needed. These include: Canes. Walkers. Scooters. Crutches. Turn on the lights when you go into a dark area. Replace any light bulbs as soon as they burn out. Set up your furniture so you have a clear path. Avoid moving your furniture around. If any of your floors are uneven, fix them. If there are any pets around you, be aware of where they are. Review your medicines with your doctor. Some medicines can make you feel dizzy. This can increase your chance of falling. Ask your doctor what other things that you can do to help prevent falls. This information is not intended to replace advice given to you by your health care provider. Make sure you discuss any questions you have with your health care provider. Document Released: 03/16/2009 Document Revised: 10/26/2015 Document Reviewed: 06/24/2014 Elsevier Interactive Patient Education  2017 [employer].

## 2021-10-31 NOTE — Progress Notes (Signed)
B12 given right deltoid. Tolerated well.

## 2021-10-31 NOTE — Progress Notes (Signed)
Subjective:   Susan Bennett is a 81 y.o. female who presents for Medicare Annual (Subsequent) preventive examination. Virtual Visit via Telephone Note  I connected with  Susan Bennett on 10/31/21 at  1:15 PM EDT by telephone and verified that I am speaking with the correct person using two identifiers.  Location: Patient: HOME Provider: [facility] Persons participating in the virtual visit: patient/Nurse Health Advisor   I discussed the limitations, risks, security and privacy concerns of performing an evaluation and management service by telephone and the availability of in person appointments. The patient expressed understanding and agreed to proceed.  Interactive audio and video telecommunications were attempted between this nurse and patient, however failed, due to patient having technical difficulties OR patient did not have access to video capability.  We continued and completed visit with audio only.  Some vital signs may be absent or patient reported.   Darral DashMary Jane K Euleta Belson, LPN  Review of Systems     Cardiac Risk Factors include: advanced age (>5155men, 42>65 women);hypertension;sedentary lifestyle;Other (see comment), Risk factor comments: Alzheimers dz, Spinal Stenosis     Objective:    Today's Vitals   10/31/21 1321 10/31/21 1323  Weight: 107 lb (48.5 kg)   Height: 5\' 4"  (1.626 m)   PainSc:  5    Body mass index is 18.37 kg/m.     10/31/2021    1:28 PM 01/20/2019    2:32 PM 01/09/2018    2:22 PM 12/25/2017    2:31 PM  Advanced Directives  Does Patient Have a Medical Advance Directive? Yes No Yes Yes  Type of Estate agentAdvance Directive Healthcare Power of [place];Living will  Healthcare Power of Attorney   Copy of Healthcare Power of Attorney in Chart? Yes - validated most recent copy scanned in chart (See row information)  No - copy requested     Current Medications (verified) Outpatient Encounter Medications as of 10/31/2021  Medication Sig   benazepril (LOTENSIN) 40 MG tablet  Take 1 tablet (40 mg total) by mouth daily.   diclofenac (VOLTAREN) 50 MG EC tablet TAKE 1 TABLET BY MOUTH TWICE DAILY AS NEEDED FOR MODERATE PAIN. ONLY use IF Tylenol not working   DM-APAP-CPM (CORICIDIN HBP) 10-325-2 MG TABS Take 1 tablet by mouth every 6 (six) hours as needed (Every 6-8 hours as needed).   donepezil (ARICEPT) 5 MG tablet Take 1 tablet (5 mg total) by mouth at bedtime. For memory   lidocaine (LIDODERM) 5 % Place 1 patch onto the skin daily. Remove & Discard patch within 12 hours or as directed by MD   memantine (NAMENDA) 10 MG tablet TAKE 1/2 (ONE-HALF) TABLET BY MOUTH QAM and 1 tablet every evening.   sertraline (ZOLOFT) 50 MG tablet Take 1.5 tablets (75 mg total) by mouth daily.   Facility-Administered Encounter Medications as of 10/31/2021  Medication   cyanocobalamin ((VITAMIN B-12)) injection 1,000 mcg    Allergies (verified) Patient has no known allergies.   History: Past Medical History:  Diagnosis Date   Chronic diarrhea    Contact dermatitis and other eczema, due to unspecified cause    Depressive disorder, not elsewhere classified    Osteoporosis, unspecified    Other and unspecified hyperlipidemia    Other B-complex deficiencies    Unspecified essential hypertension    Unspecified gastritis and gastroduodenitis without mention of hemorrhage    Unspecified vitamin D deficiency    Past Surgical History:  Procedure Laterality Date   COLONOSCOPY     9 YEARS  AGO   Family History  Problem Relation Age of Onset   Healthy Sister    Healthy Brother    Throat cancer Brother    Stroke Brother    Heart disease Brother    Throat cancer Maternal Aunt    Social History   Socioeconomic History   Marital status: Divorced    Spouse name: Not on file   Number of children: 1   Years of education: Not on file   Highest education level: 12th grade  Occupational History   Occupation: retired   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  Tobacco Use    Smoking status: Former    Types: Cigarettes    Quit date: 02/20/2001    Years since quitting: 20.7   Smokeless tobacco: Never  Vaping Use   Vaping Use: Never used  Substance and Sexual Activity   Alcohol use: No   Drug use: No   Sexual activity: Not Currently  Other Topics Concern   Not on file  Social History Narrative   Lives alone in 1 story home with full basement   Has 1 adult child   Highest level of education: high school diploma   Retired Advertising account planner   Social Determinants of Corporate investment banker Strain: Low Risk    Difficulty of Paying Living Expenses: Not hard at all  Food Insecurity: No Food Insecurity   Worried About Programme researcher, broadcasting/film/video in the Last Year: Never true   Barista in the Last Year: Never true  Transportation Needs: No Transportation Needs   Lack of Transportation (Medical): No   Lack of Transportation (Non-Medical): No  Physical Activity: Insufficiently Active   Days of Exercise per Week: 3 days   Minutes of Exercise per Session: 10 min  Stress: No Stress Concern Present   Feeling of Stress : Not at all  Social Connections: Socially Isolated   Frequency of Communication with Friends and Family: More than three times a week   Frequency of Social Gatherings with Friends and Family: More than three times a week   Attends Religious Services: Never   Database administrator or Organizations: No   Attends Engineer, structural: Never   Marital Status: Divorced    Tobacco Counseling Counseling given: Not Answered   Clinical Intake:  Pre-visit preparation completed: Yes  Pain : 0-10 Pain Score: 5  Pain Type: Chronic pain Pain Location: Back Pain Descriptors / Indicators: Aching, Dull Pain Onset: More than a month ago Pain Frequency: Intermittent     BMI - recorded: 18.37 Nutritional Status: BMI <19  Underweight Nutritional Risks: None Diabetes: No  How often do you need to have someone help you when you read  instructions, pamphlets, or other written materials from your doctor or pharmacy?: 5 - Always  Diabetic?NO  Interpreter Needed?: No  Information entered by :: mj Ebunoluwa Gernert, lpn   Activities of Daily Living    10/31/2021    1:31 PM  In your present state of health, do you have any difficulty performing the following activities:  Hearing? 0  Vision? 0  Difficulty concentrating or making decisions? 1  Walking or climbing stairs? 0  Dressing or bathing? 1  Doing errands, shopping? 1  Preparing Food and eating ? Y  Using the Toilet? N  In the past six months, have you accidently leaked urine? Y  Do you have problems with loss of bowel control? Y  Managing your Medications?  Y  Managing your Finances? Y  Housekeeping or managing your Housekeeping? Y    Patient Care Team: Raliegh Ip, DO as PCP - General (Family Medicine) Gwenith Daily, RN as Case Manager  Indicate any recent Medical Services you may have received from other than [facility] providers in the past year (date may be approximate).     Assessment:   This is a routine wellness examination for Susan Bennett.  Hearing/Vision screen Hearing Screening - Comments:: No hearing issues.  Vision Screening - Comments:: Readers. Dr. Hazle Quant in [place]. 2022.  Dietary issues and exercise activities discussed: Current Exercise Habits: Home exercise routine, Type of exercise: walking;stretching, Time (Minutes): 20, Frequency (Times/Week): 3, Weekly Exercise (Minutes/Week): 60, Intensity: Mild, Exercise limited by: cardiac condition(s);neurologic condition(s);orthopedic condition(s)   Goals Addressed             This Visit's Progress    Exercise 150 min/wk Moderate Activity   Not on track    Have 3 meals a day   Improving      Depression Screen    10/31/2021    1:26 PM 09/12/2021    4:26 PM 08/08/2021    3:42 PM 04/19/2020    1:48 PM 12/15/2019    1:31 PM 09/15/2019    3:36 PM 12/28/2018    3:15 PM  PHQ 2/9 Scores  PHQ - 2  Score 1 2  0 2 0 0  PHQ- 9 Score 1 3  0 4 1   Exception Documentation   Patient refusal        Fall Risk    10/31/2021    1:29 PM 09/12/2021    4:26 PM 08/08/2021    3:42 PM 04/19/2020    1:48 PM 04/03/2020    3:00 PM  Fall Risk   Falls in the past year? 0 0 0 0 0  Number falls in past yr: 0  0    Injury with Fall? 0  0    Risk for fall due to : Impaired balance/gait;Mental status change    Mental status change;Other (Comment)  Risk for fall due to: Comment     chronic back pain  Follow up Falls prevention discussed    Falls evaluation completed;Falls prevention discussed    FALL RISK PREVENTION PERTAINING TO THE HOME:  Any stairs in or around the home? No  If so, are there any without handrails? No  Home free of loose throw rugs in walkways, pet beds, electrical cords, etc? Yes  Adequate lighting in your home to reduce risk of falls? Yes   ASSISTIVE DEVICES UTILIZED TO PREVENT FALLS:  Life alert? No  Use of a cane, walker or w/c? No  Grab bars in the bathroom? Yes  Shower chair or bench in shower? Yes  Elevated toilet seat or a handicapped toilet? No   TIMED UP AND GO:  Was the test performed? No .  Phone visit.   Cognitive Function: Patient has current diagnosis of cognitive impairment. 10/31/2021. Patient is unable to complete screening 6CIT or MMSE.      09/15/2019    3:50 PM 01/09/2018    2:25 PM 04/19/2016   11:30 AM  MMSE - Mini Mental State Exam  Orientation to time 2 1 5   Orientation to Place 4 5 5   Registration 3 3 3   Attention/ Calculation 5 4 3   Recall 0 0 1  Language- name 2 objects 2 2 2   Language- repeat 1 0 1  Language- follow 3 step command  Language- read & follow direction Write a sentence Copy design 0 1 1  Total score 03/20/2017   11:00 AM  Montreal Cognitive Assessment   Visuospatial/ Executive (0/5) 5  Naming (0/3) 3  Attention: Read list of digits (0/2) 2  Attention: Read list of letters (0/1) 1   Attention: Serial 7 subtraction starting at 100 (0/3) 3  Language: Repeat phrase (0/2) 2  Language : Fluency (0/1) 1  Abstraction (0/2) 2  Delayed Recall (0/5) 0  Orientation (0/6) 4  Total 23      Immunizations  There is no immunization history on file for this patient.  TDAP status: Due, Education has been provided regarding the importance of this vaccine. Advised may receive this vaccine at local pharmacy or Health Dept. Aware to provide a copy of the vaccination record if obtained from local pharmacy or Health Dept. Verbalized acceptance and understanding.  Flu Vaccine status: Declined, Education has been provided regarding the importance of this vaccine but patient still declined. Advised may receive this vaccine at local pharmacy or Health Dept. Aware to provide a copy of the vaccination record if obtained from local pharmacy or Health Dept. Verbalized acceptance and understanding.  Pneumococcal vaccine status: Declined,  Education has been provided regarding the importance of this vaccine but patient still declined. Advised may receive this vaccine at local pharmacy or Health Dept. Aware to provide a copy of the vaccination record if obtained from local pharmacy or Health Dept. Verbalized acceptance and understanding.   Covid-19 vaccine status: Declined, Education has been provided regarding the importance of this vaccine but patient still declined. Advised may receive this vaccine at local pharmacy or Health Dept.or vaccine clinic. Aware to provide a copy of the vaccination record if obtained from local pharmacy or Health Dept. Verbalized acceptance and understanding.  Qualifies for Shingles Vaccine? Yes   Zostavax completed No   Shingrix Completed?: No.    Education has been provided regarding the importance of this vaccine. Patient has been advised to call insurance company to determine out of pocket expense if they have not yet received this vaccine. Advised may also receive  vaccine at local pharmacy or Health Dept. Verbalized acceptance and understanding.  Screening Tests Health Maintenance  Topic Date Due   COVID-19 Vaccine (1) 11/16/2021 (Originally 08/27/1941)   Zoster Vaccines- Shingrix (1 of 2) 12/12/2021 (Originally 02/26/1989)   Pneumonia Vaccine 65+ Years old (1 - PCV) 09/13/2022 (Originally 02/27/2004)   TETANUS/TDAP  09/13/2022 (Originally 02/26/1958)   DEXA SCAN  Completed   HPV VACCINES  Aged Out   INFLUENZA VACCINE  Discontinued    Health Maintenance  There are no preventive care reminders to display for this patient.   Colorectal cancer screening: No longer required.   Mammogram status: No longer required due to age.  Bone Density status: Completed 02/13/2011. Results reflect: Bone density results: OSTEOPENIA. Repeat every 2 years.  Lung Cancer Screening: (Low Dose CT Chest recommended if Age 43-80 years, 30 pack-year currently smoking OR have quit w/in 15years.) does not qualify.   Additional Screening:  Hepatitis C Screening: does not qualify;   Vision Screening: Recommended annual ophthalmology exams for early detection of glaucoma and other disorders of the eye. Is the patient up to date with their annual eye exam?  Yes  Who is the provider or what is the name of the office in which the patient  attends annual eye exams? Dr. Towana Badger If pt is not established with a provider, would they like to be referred to a provider to establish care? No .   Dental Screening: Recommended annual dental exams for proper oral hygiene  Community Resource Referral / Chronic Care Management: CRR required this visit?  No   CCM required this visit?  No      Plan:     I have personally reviewed and noted the following in the patient's chart:   Medical and social history Use of alcohol, tobacco or illicit drugs  Current medications and supplements including opioid prescriptions.  Functional ability and status Nutritional status Physical  activity Advanced directives List of other physicians Hospitalizations, surgeries, and ER visits in previous 12 months Vitals Screenings to include cognitive, depression, and falls Referrals and appointments  In addition, I have reviewed and discussed with patient certain preventive protocols, quality metrics, and best practice recommendations. A written personalized care plan for preventive services as well as general preventive health recommendations were provided to patient.     Darral Dash, LPN   1/61/0960   Nurse Notes: Visit completed with patient, caregiver and pt's daughter Susan Bennett due to patient's Alzheimer Dx. Declines vaccines.

## 2021-11-14 ENCOUNTER — Ambulatory Visit: Payer: Medicare Other

## 2021-11-15 ENCOUNTER — Ambulatory Visit (INDEPENDENT_AMBULATORY_CARE_PROVIDER_SITE_OTHER): Payer: Medicare Other | Admitting: Orthopaedic Surgery

## 2021-11-15 ENCOUNTER — Encounter: Payer: Self-pay | Admitting: Orthopaedic Surgery

## 2021-11-15 DIAGNOSIS — M5126 Other intervertebral disc displacement, lumbar region: Secondary | ICD-10-CM

## 2021-11-15 HISTORY — DX: Other intervertebral disc displacement, lumbar region: M51.26

## 2021-11-15 NOTE — Progress Notes (Unsigned)
   Office Visit Note   Patient: Susan Bennett           Date of Birth: [birth year]           MRN: [id number] Visit Date: 11/15/2021              Requested by: Raliegh Ip, DO [address] [place],  [place] [address] PCP: Raliegh Ip, DO   Assessment & Plan: Visit Diagnoses:  1. Protrusion of lumbar intervertebral disc     Plan: ***  Follow-Up Instructions: Return in about 4 weeks (around 12/13/2021).   Orders:  No orders of the defined types were placed in this encounter.  No orders of the defined types were placed in this encounter.     Procedures: No procedures performed   Clinical Data: No additional findings.   Subjective: Chief Complaint  Patient presents with   Right Hip - Pain    It has hurt for several months and the pain stays in hip area, makes my back hurt some too.    HPI  Review of Systems   Objective: Vital Signs: BP 127/85   Pulse 63   Ht 5\' 3"  (1.6 m)   Wt 105 lb 2 oz (47.7 kg)   BMI 18.62 kg/m   Physical Exam  Ortho Exam  Specialty Comments:  No specialty comments available.  Imaging: No results found.   PMFS History: Patient Active Problem List   Diagnosis Date Noted   Protrusion of lumbar intervertebral disc 11/15/2021   Low back pain 01/27/2019   Mild cognitive impairment 03/31/2017   Alzheimer's dementia without behavioral disturbance (HCC) 12/02/2016   Vitamin B12 deficiency 12/02/2016   Essential hypertension 04/19/2016   Mild episode of recurrent major depressive disorder (HCC) 04/19/2016   Memory loss 04/19/2016   Past Medical History:  Diagnosis Date   Chronic diarrhea    Contact dermatitis and other eczema, due to unspecified cause    Depressive disorder, not elsewhere classified    Osteoporosis, unspecified    Other and unspecified hyperlipidemia    Other B-complex deficiencies    Unspecified essential hypertension    Unspecified gastritis and gastroduodenitis without mention of hemorrhage     Unspecified vitamin D deficiency     Family History  Problem Relation Age of Onset   Healthy Sister    Healthy Brother    Throat cancer Brother    Stroke Brother    Heart disease Brother    Throat cancer Maternal Aunt     Past Surgical History:  Procedure Laterality Date   COLONOSCOPY     9 YEARS AGO   Social History   Occupational History   Occupation: retired   Occupation: 04/21/2016    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  Tobacco Use   Smoking status: Former    Types: Cigarettes    Quit date: 02/20/2001    Years since quitting: 20.7   Smokeless tobacco: Never  Vaping Use   Vaping Use: Never used  Substance and Sexual Activity   Alcohol use: No   Drug use: No   Sexual activity: Not Currently

## 2021-12-03 ENCOUNTER — Ambulatory Visit: Payer: Medicare Other

## 2021-12-05 ENCOUNTER — Ambulatory Visit: Payer: Medicare Other

## 2021-12-07 ENCOUNTER — Ambulatory Visit (INDEPENDENT_AMBULATORY_CARE_PROVIDER_SITE_OTHER): Payer: Medicare Other

## 2021-12-07 DIAGNOSIS — E538 Deficiency of other specified B group vitamins: Secondary | ICD-10-CM | POA: Diagnosis not present

## 2021-12-07 NOTE — Progress Notes (Signed)
Cyanocobalamin injection given to right deltoid.  Patient tolerated well. 

## 2021-12-13 ENCOUNTER — Ambulatory Visit (INDEPENDENT_AMBULATORY_CARE_PROVIDER_SITE_OTHER): Payer: Medicare Other | Admitting: Orthopaedic Surgery

## 2021-12-13 DIAGNOSIS — M5126 Other intervertebral disc displacement, lumbar region: Secondary | ICD-10-CM

## 2021-12-13 NOTE — Progress Notes (Signed)
Office Visit Note   Patient: Susan Bennett           Date of Birth: [birth year]           MRN: [id number] Visit Date: 12/13/2021              Requested by: Raliegh Ip, DO [address] [place],  [place] [address] PCP: Raliegh Ip, DO   Assessment & Plan: Visit Diagnoses:  1. Protrusion of lumbar intervertebral disc     Plan: We will set patient up for repeat epidural.  Her symptoms are most suggestive of the right L3-4 L4-5 disc protrusion.  She can see Dr. Alvester Morin to can select epidural injection to help with her symptoms which previously gave her about 7 months of relief.  Her daughter still needs to be used to call for scheduling the epidural.  Follow-Up Instructions: No follow-ups on file.   Orders:  Orders Placed This Encounter  Procedures   Ambulatory referral to Physical Medicine Rehab   No orders of the defined types were placed in this encounter.     Procedures: No procedures performed   Clinical Data: No additional findings.   Subjective: Chief Complaint  Patient presents with   Lower Back - Follow-up    HPI 81 year old female here with her daughter with ongoing problems with back pain and pain along the right posterior iliac crest.  Her pain comes and goes to days as it is a little bit better other days she cannot hardly walk and make it to the bathroom.  She is used Tylenol.  Pain tends to come and go and she has had problems with this for more than 2 years.  She does have mild cognitive impairment memory loss.  Previous MRI scan 01/21/2019 lumbar ordered by Dr. Cleophas Dunker showed retrolisthesis L2-3 with spondylosis right lateral foraminal disc protrusion on the right consistent with her symptoms and some right L4-5 foraminal protrusion.  Images suggest she had a five 1 injection with Dr. Alvester Morin.  She did well for about 7 months by chart review and then have recurrence of symptoms once again.  No associated bowel or bladder symptoms.  No falls within  the last year.  Review of Systems all other systems noncontributory to HPI.   Objective: Vital Signs: There were no vitals taken for this visit.  Physical Exam Constitutional:      Appearance: She is well-developed.  HENT:     Head: Normocephalic.     Right Ear: External ear normal.     Left Ear: External ear normal. There is no impacted cerumen.  Eyes:     Pupils: Pupils are equal, round, and reactive to light.  Neck:     Thyroid: No thyromegaly.     Trachea: No tracheal deviation.  Cardiovascular:     Rate and Rhythm: Normal rate.  Pulmonary:     Effort: Pulmonary effort is normal.  Abdominal:     Palpations: Abdomen is soft.  Musculoskeletal:     Cervical back: No rigidity.  Skin:    General: Skin is warm and dry.  Neurological:     Mental Status: She is alert and oriented to person, place, and time.  Psychiatric:        Behavior: Behavior normal.     Comments: Some decreased cognitive function.     Ortho Exam negative logroll the hips short stride gait.  No rash or exposed skin.  Some tenderness right lumbar region.  Scoliosis centered  at L3 left degenerative lumbar.  Specialty Comments:  No specialty comments available.  Imaging: No results found.   PMFS History: Patient Active Problem List   Diagnosis Date Noted   Protrusion of lumbar intervertebral disc 11/15/2021   Low back pain 01/27/2019   Mild cognitive impairment 03/31/2017   Alzheimer's dementia without behavioral disturbance (HCC) 12/02/2016   Vitamin B12 deficiency 12/02/2016   Essential hypertension 04/19/2016   Mild episode of recurrent major depressive disorder (HCC) 04/19/2016   Memory loss 04/19/2016   Past Medical History:  Diagnosis Date   Chronic diarrhea    Contact dermatitis and other eczema, due to unspecified cause    Depressive disorder, not elsewhere classified    Osteoporosis, unspecified    Other and unspecified hyperlipidemia    Other B-complex deficiencies     Unspecified essential hypertension    Unspecified gastritis and gastroduodenitis without mention of hemorrhage    Unspecified vitamin D deficiency     Family History  Problem Relation Age of Onset   Healthy Sister    Healthy Brother    Throat cancer Brother    Stroke Brother    Heart disease Brother    Throat cancer Maternal Aunt     Past Surgical History:  Procedure Laterality Date   COLONOSCOPY     9 YEARS AGO   Social History   Occupational History   Occupation: retired   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

## 2021-12-14 ENCOUNTER — Ambulatory Visit: Payer: Self-pay | Admitting: *Deleted

## 2021-12-14 NOTE — Patient Instructions (Signed)
Susan Bennett  I have previously worked with you through the Chronic Care Management Program at [facility]. Due to program changes I am removing myself from your care team because you've either met our goals, your conditions are stable and no longer require care management, or we haven't engaged within the past 6 months. If you are currently active with another CCM Team Member, you will remain active with them unless they reach out to you with additional information. If you feel that you need RN Care Management services in the future, please talk with your primary care provider to discuss re-engagement with the RN Care Manager that will be assigned to [facility]. This does not affect your status as a patient at [facility].   Thank you for allowing me to participate in your your healthcare journey.  [name], BSN, RN-BC Embedded Chronic Care Manager [facility] Direct Dial: [phone]

## 2021-12-14 NOTE — Chronic Care Management (AMB) (Signed)
  Chronic Care Management   Note  12/14/2021 Name: Susan Bennett MRN: [id number] DOB: 11-16-1940   Patient has either met RN Care Management goals, is stable from [facility] Management perspective, or has not recently engaged with the RN Care Manager. I am removing RN Care Manager from Care Team and closing [facility]. If patient is currently engaged with another CCM team member I will forward this encounter to inform them of my case closure. Patient may be eligible for re-engagement with RN Care Manager in the future if necessary and can discuss this with their PCP.  [name], BSN, RN-BC Embedded Chronic Care Manager [facility] Direct Dial: [phone]

## 2022-01-09 ENCOUNTER — Ambulatory Visit: Payer: Self-pay

## 2022-01-09 ENCOUNTER — Encounter: Payer: Self-pay | Admitting: Physical Medicine and Rehabilitation

## 2022-01-09 ENCOUNTER — Ambulatory Visit (INDEPENDENT_AMBULATORY_CARE_PROVIDER_SITE_OTHER): Payer: Medicare Other | Admitting: Physical Medicine and Rehabilitation

## 2022-01-09 VITALS — BP 151/70 | HR 62

## 2022-01-09 DIAGNOSIS — M5416 Radiculopathy, lumbar region: Secondary | ICD-10-CM | POA: Diagnosis not present

## 2022-01-09 MED ORDER — METHYLPREDNISOLONE ACETATE 80 MG/ML IJ SUSP
80.0000 mg | Freq: Once | INTRAMUSCULAR | Status: AC
  Administered 2022-01-09: 80 mg

## 2022-01-09 NOTE — Progress Notes (Signed)
Pt state lower back pain mostly on her right side. Pt state sitting for a long time makes the pain worse. Pt state she takes over the counter pain meds to help ease her pain.  Numeric Pain Rating Scale and Functional Assessment Average Pain 5   In the last MONTH (on 0-10 scale) has pain interfered with the following?  1. General activity like being  able to carry out your everyday physical activities such as walking, climbing stairs, carrying groceries, or moving a chair?  Rating(8)   +Driver, -BT, -Dye Allergies.

## 2022-01-09 NOTE — Procedures (Signed)
Lumbar Epidural Steroid Injection - Interlaminar Approach with Fluoroscopic Guidance  Patient: Susan Bennett      Date of Birth: 1941-01-16 MRN: [id number] PCP: Raliegh Ip, DO      Visit Date: 01/09/2022   Universal Protocol:     Consent Given By: the patient  Position: PRONE  Additional Comments: Vital signs were monitored before and after the procedure. Patient was prepped and draped in the usual sterile fashion. The correct patient, procedure, and site was verified.   Injection Procedure Details:   Procedure diagnoses: Lumbar radiculopathy [M54.16]   Meds Administered:  Meds ordered this encounter  Medications   methylPREDNISolone acetate (DEPO-MEDROL) injection 80 mg     Laterality: Right  Location/Site:  L5-S1  Needle: 3.5 in., 20 ga. Tuohy  Needle Placement: Paramedian epidural  Findings:   -Comments: Excellent flow of contrast into the epidural space.  Procedure Details: Using a paramedian approach from the side mentioned above, the region overlying the inferior lamina was localized under fluoroscopic visualization and the soft tissues overlying this structure were infiltrated with 4 ml. of 1% Lidocaine without Epinephrine. The Tuohy needle was inserted into the epidural space using a paramedian approach.   The epidural space was localized using loss of resistance along with counter oblique bi-planar fluoroscopic views.  After negative aspirate for air, blood, and CSF, a 2 ml. volume of Isovue-250 was injected into the epidural space and the flow of contrast was observed. Radiographs were obtained for documentation purposes.    The injectate was administered into the level noted above.   Additional Comments:  The patient tolerated the procedure well Dressing: 2 x 2 sterile gauze and Band-Aid    Post-procedure details: Patient was observed during the procedure. Post-procedure instructions were reviewed.  Patient left the clinic in stable  condition.

## 2022-01-09 NOTE — Progress Notes (Signed)
Susan Bennett - 81 y.o. female MRN [id number]  Date of birth: [birth year]  Office Visit Note: Visit Date: 01/09/2022 PCP: Raliegh Ip, DO Referred by: Raliegh Ip, DO  Subjective: Chief Complaint  Patient presents with   Lower Back - Pain   HPI:  MERCEDIES Bennett is a 81 y.o. female who comes in today at the request of Dr. Annell Greening for planned Right L5-S1 Lumbar Interlaminar epidural steroid injection with fluoroscopic guidance.  The patient has failed conservative care including home exercise, medications, time and activity modification.  This injection will be diagnostic and hopefully therapeutic.  Please see requesting physician notes for further details and justification.  She did have a prior epidural injection several years ago and did well.  She does not really remember at this point having the injection and seems to have some memory issues.    ROS Otherwise per HPI.  Assessment & Plan: Visit Diagnoses:    ICD-10-CM   1. Lumbar radiculopathy  M54.16 XR C-ARM NO REPORT    Epidural Steroid injection    methylPREDNISolone acetate (DEPO-MEDROL) injection 80 mg      Plan: No additional findings.   Meds & Orders:  Meds ordered this encounter  Medications   methylPREDNISolone acetate (DEPO-MEDROL) injection 80 mg    Orders Placed This Encounter  Procedures   XR C-ARM NO REPORT   Epidural Steroid injection    Follow-up: No follow-ups on file.   Procedures: No procedures performed  Lumbar Epidural Steroid Injection - Interlaminar Approach with Fluoroscopic Guidance  Patient: Susan Bennett      Date of Birth: [birth year] MRN: [id number] PCP: Raliegh Ip, DO      Visit Date: 01/09/2022   Universal Protocol:     Consent Given By: the patient  Position: PRONE  Additional Comments: Vital signs were monitored before and after the procedure. Patient was prepped and draped in the usual sterile fashion. The correct patient, procedure, and site was  verified.   Injection Procedure Details:   Procedure diagnoses: Lumbar radiculopathy [M54.16]   Meds Administered:  Meds ordered this encounter  Medications   methylPREDNISolone acetate (DEPO-MEDROL) injection 80 mg     Laterality: Right  Location/Site:  L5-S1  Needle: 3.5 in., 20 ga. Tuohy  Needle Placement: Paramedian epidural  Findings:   -Comments: Excellent flow of contrast into the epidural space.  Procedure Details: Using a paramedian approach from the side mentioned above, the region overlying the inferior lamina was localized under fluoroscopic visualization and the soft tissues overlying this structure were infiltrated with 4 ml. of 1% Lidocaine without Epinephrine. The Tuohy needle was inserted into the epidural space using a paramedian approach.   The epidural space was localized using loss of resistance along with counter oblique bi-planar fluoroscopic views.  After negative aspirate for air, blood, and CSF, a 2 ml. volume of Isovue-250 was injected into the epidural space and the flow of contrast was observed. Radiographs were obtained for documentation purposes.    The injectate was administered into the level noted above.   Additional Comments:  The patient tolerated the procedure well Dressing: 2 x 2 sterile gauze and Band-Aid    Post-procedure details: Patient was observed during the procedure. Post-procedure instructions were reviewed.  Patient left the clinic in stable condition.    Clinical History: MRI LUMBAR SPINE WITHOUT CONTRAST     TECHNIQUE:  Multiplanar, multisequence MR imaging of the lumbar spine was  performed. No intravenous contrast was administered.  COMPARISON:  None.     FINDINGS:  Segmentation: There are 5 non-rib bearing lumbar type vertebral  bodies with the last intervertebral disc space labeled as L5-S1.     Alignment: There is a minimal retrolisthesis of L5 on S1. there is  also a minimal retrolisthesis of L2 on L3.      Vertebrae: The vertebral body heights are well maintained. There is  endplate reactive changes seen at L3-L4 and L5-S1.     Conus medullaris and cauda equina: Conus extends to the L1 level.  Conus and cauda equina appear normal.     Paraspinal and other soft tissues: There is a 1.7 cm cystic lesion  adjacent to the iliac vasculature best seen on series 6, image 46.  The remainder of the paraspinal soft tissues and visualized  retroperitoneal structures are unremarkable. The sacroiliac joints  are intact.     Disc levels:     T12-L1:  No significant canal or neural foraminal narrowing.     L1-L2: There is a broad-based disc bulge with a tiny focal central  disc protrusion. There is facet arthrosis and ligamentum flavum  hypertrophy. Mild effacement anterior thecal sac is seen.     L2-L3: There is a broad-based disc bulge with facet arthrosis and  ligamentum flavum hypertrophy. Moderate right and mild left neural  foraminal narrowing is seen. There is mild effacement of the  anterior thecal sac.     L3-L4: There is a broad-based disc bulge with a right lateral  recess/foraminal disc protrusion which contacts the descending right  L4 nerve root. There is severe right and mild left neural foraminal  narrowing. The central thecal sac is effaced measuring 7 mm in AP  diameter.     L4-L5: There is a broad-based disc bulge with a right foraminal disc  protrusion which contacts the descending right L5 nerve root. There  is severe bilateral neural foraminal narrowing. The central thecal  sac measures 6 mm in AP diameter.     L5-S1: There is a broad-based diffuse bulge which is eccentric to  the left with facet arthrosis and ligamentum flavum hypertrophy.  Severe left and moderate right neural foraminal narrowing are seen.     IMPRESSION:  1. Minimal retrolisthesis of L2 on L3 and L5 on S1.  2. Lumbar spine spondylosis most notable at L3-L4 with a right  lateral recess/foraminal  disc protrusion contacting the descending  right L4 nerve root with severe right neural foraminal narrowing and  mild to moderate central canal stenosis. Also at L4-L5 there is a  right foraminal disc protrusion contacting the descending right L5  nerve root with severe bilateral neural foraminal narrowing and  moderate central canal stenosis.        Electronically Signed    By: Jonna Clark M.D.    On: 01/21/2019 14:26     Objective:  VS:  HT:    WT:   BMI:     BP:(!) 151/70  HR:62bpm  TEMP: ( )  RESP:  Physical Exam Vitals and nursing note reviewed.  Constitutional:      General: She is not in acute distress.    Appearance: Normal appearance. She is not ill-appearing.  HENT:     Head: Normocephalic and atraumatic.     Right Ear: External ear normal.     Left Ear: External ear normal.  Eyes:     Extraocular Movements: Extraocular movements intact.  Cardiovascular:     Rate and Rhythm: Normal rate.  Pulses: Normal pulses.  Pulmonary:     Effort: Pulmonary effort is normal. No respiratory distress.  Abdominal:     General: There is no distension.     Palpations: Abdomen is soft.  Musculoskeletal:        General: Tenderness present.     Cervical back: Neck supple.     Right lower leg: No edema.     Left lower leg: No edema.     Comments: Patient has good distal strength with no pain over the greater trochanters.  No clonus or focal weakness.  Skin:    Findings: No erythema, lesion or rash.  Neurological:     General: No focal deficit present.     Mental Status: She is alert and oriented to person, place, and time.     Sensory: No sensory deficit.     Motor: No weakness or abnormal muscle tone.     Coordination: Coordination normal.  Psychiatric:        Mood and Affect: Mood normal.        Behavior: Behavior normal.      Imaging: XR C-ARM NO REPORT  Result Date: 01/09/2022 Please see Notes tab for imaging impression.

## 2022-01-09 NOTE — Patient Instructions (Signed)

## 2022-01-17 ENCOUNTER — Ambulatory Visit (INDEPENDENT_AMBULATORY_CARE_PROVIDER_SITE_OTHER): Payer: Medicare Other | Admitting: Family Medicine

## 2022-01-17 ENCOUNTER — Encounter: Payer: Self-pay | Admitting: Family Medicine

## 2022-01-17 VITALS — BP 180/84 | HR 59 | Temp 98.4°F | Ht 63.0 in | Wt 105.2 lb

## 2022-01-17 DIAGNOSIS — M25462 Effusion, left knee: Secondary | ICD-10-CM

## 2022-01-17 DIAGNOSIS — E538 Deficiency of other specified B group vitamins: Secondary | ICD-10-CM | POA: Diagnosis not present

## 2022-01-17 DIAGNOSIS — I10 Essential (primary) hypertension: Secondary | ICD-10-CM | POA: Diagnosis not present

## 2022-01-17 NOTE — Progress Notes (Signed)
Acute Office Visit  Subjective:     Patient ID: Susan Bennett, female    DOB: 1941-01-14, 81 y.o.   MRN: [id number]  Chief Complaint  Patient presents with   Knee Pain    Knee Pain    Cate is here with a caregiver today. Patient is in today for swelling behind her left knee. She reports that this occurred last week and has since resolved. She denies pain, erythema, warmth, injury, numbness, tingling, or immobility.   She forgot to take her BP medication last night. She has not been monitoring her BP at home. She denies chest pain, shortness of breath, edema, changes in mental status, orthopnea, visual changes, dizziness, palpitations, or focal weakness.   Her PCP started her on B12 IM injections. She is due to have her B12 level checked. There are future orders in from her PCP for this.   ROS As per HPI.      Objective:    BP (!) 205/100   Pulse (!) 59   Temp 98.4 F (36.9 C) (Temporal)   Ht 5\' 3"  (1.6 m)   Wt 105 lb 4 oz (47.7 kg)   SpO2 97%   BMI 18.64 kg/m  BP Readings from Last 3 Encounters:  01/17/22 (!) 205/100  01/09/22 (!) 151/70  11/15/21 127/85      Physical Exam Vitals and nursing note reviewed.  Constitutional:      General: She is not in acute distress.    Appearance: She is not ill-appearing, toxic-appearing or diaphoretic.  HENT:     Head: Normocephalic and atraumatic.  Eyes:     Extraocular Movements: Extraocular movements intact.     Pupils: Pupils are equal, round, and reactive to light.  Neck:     Vascular: No JVD.  Cardiovascular:     Rate and Rhythm: Normal rate and regular rhythm.     Heart sounds: Normal heart sounds. No murmur heard. Pulmonary:     Effort: Pulmonary effort is normal. No respiratory distress.     Breath sounds: Normal breath sounds. No wheezing.  Abdominal:     General: Bowel sounds are normal. There is no distension.     Palpations: Abdomen is soft.     Tenderness: There is no abdominal tenderness. There is no  guarding or rebound.  Musculoskeletal:     Left knee: No swelling, deformity, effusion, erythema or bony tenderness. No tenderness. Normal patellar mobility.     Right lower leg: No edema.     Left lower leg: No edema.  Skin:    General: Skin is warm and dry.  Neurological:     Mental Status: She is alert and oriented to person, place, and time. Mental status is at baseline.     Motor: No weakness.  Psychiatric:        Mood and Affect: Mood normal.        Behavior: Behavior normal.    No results found for any visits on 01/17/22.     Assessment & Plan:   Ayonna was seen today for knee pain.  Diagnoses and all orders for this visit:  Swelling of left knee joint Resolved.   Uncontrolled hypertension Asymptomatic. Forgot medication last night. Instructed to take medication when she returns home after visit. Monitor BP and follow up for persistently elevated readings. Discussed when to seek emergency care.   Vitamin B12 deficiency B12 level recheck per PCP future orders.  -     Vitamin B12  Return for  chronic follow up with PCP.  The patient indicates understanding of these issues and agrees with the plan.  Gabriel Earing, FNP

## 2022-01-17 NOTE — Patient Instructions (Signed)
Hypertension, Adult High blood pressure (hypertension) is when the force of blood pumping through the arteries is too strong. The arteries are the blood vessels that carry blood from the heart throughout the body. Hypertension forces the heart to work harder to pump blood and may cause arteries to become narrow or stiff. Untreated or uncontrolled hypertension can lead to a heart attack, heart failure, a stroke, kidney disease, and other problems. A blood pressure reading consists of a higher number over a lower number. Ideally, your blood pressure should be below 120/80. The first ("top") number is called the systolic pressure. It is a measure of the pressure in your arteries as your heart beats. The second ("bottom") number is called the diastolic pressure. It is a measure of the pressure in your arteries as the heart relaxes. What are the causes? The exact cause of this condition is not known. There are some conditions that result in high blood pressure. What increases the risk? Certain factors may make you more likely to develop high blood pressure. Some of these risk factors are under your control, including: Smoking. Not getting enough exercise or physical activity. Being overweight. Having too much fat, sugar, calories, or salt (sodium) in your diet. Drinking too much alcohol. Other risk factors include: Having a personal history of heart disease, diabetes, high cholesterol, or kidney disease. Stress. Having a family history of high blood pressure and high cholesterol. Having obstructive sleep apnea. Age. The risk increases with age. What are the signs or symptoms? High blood pressure may not cause symptoms. Very high blood pressure (hypertensive crisis) may cause: Headache. Fast or irregular heartbeats (palpitations). Shortness of breath. Nosebleed. Nausea and vomiting. Vision changes. Severe chest pain, dizziness, and seizures. How is this diagnosed? This condition is diagnosed by  measuring your blood pressure while you are seated, with your arm resting on a flat surface, your legs uncrossed, and your feet flat on the floor. The cuff of the blood pressure monitor will be placed directly against the skin of your upper arm at the level of your heart. Blood pressure should be measured at least twice using the same arm. Certain conditions can cause a difference in blood pressure between your right and left arms. If you have a high blood pressure reading during one visit or you have normal blood pressure with other risk factors, you may be asked to: Return on a different day to have your blood pressure checked again. Monitor your blood pressure at home for 1 week or longer. If you are diagnosed with hypertension, you may have other blood or imaging tests to help your health care provider understand your overall risk for other conditions. How is this treated? This condition is treated by making healthy lifestyle changes, such as eating healthy foods, exercising more, and reducing your alcohol intake. You may be referred for counseling on a healthy diet and physical activity. Your health care provider may prescribe medicine if lifestyle changes are not enough to get your blood pressure under control and if: Your systolic blood pressure is above 130. Your diastolic blood pressure is above 80. Your personal target blood pressure may vary depending on your medical conditions, your age, and other factors. Follow these instructions at home: Eating and drinking  Eat a diet that is high in fiber and potassium, and low in sodium, added sugar, and fat. An example of this eating plan is called the DASH diet. DASH stands for Dietary Approaches to Stop Hypertension. To eat this way: Eat   plenty of fresh fruits and vegetables. Try to fill one half of your plate at each meal with fruits and vegetables. Eat whole grains, such as whole-wheat pasta, brown rice, or whole-grain bread. Fill about one  fourth of your plate with whole grains. Eat or drink low-fat dairy products, such as skim milk or low-fat yogurt. Avoid fatty cuts of meat, processed or cured meats, and poultry with skin. Fill about one fourth of your plate with lean proteins, such as fish, chicken without skin, beans, eggs, or tofu. Avoid pre-made and processed foods. These tend to be higher in sodium, added sugar, and fat. Reduce your daily sodium intake. Many people with hypertension should eat less than 1,500 mg of sodium a day. Do not drink alcohol if: Your health care provider tells you not to drink. You are pregnant, may be pregnant, or are planning to become pregnant. If you drink alcohol: Limit how much you have to: 0-1 drink a day for women. 0-2 drinks a day for men. Know how much alcohol is in your drink. In the [place] one drink equals one 12 oz bottle of beer (355 mL), one 5 oz glass of wine (148 mL), or one 1 oz glass of hard liquor (44 mL). Lifestyle  Work with your health care provider to maintain a healthy body weight or to lose weight. Ask what an ideal weight is for you. Get at least 30 minutes of exercise that causes your heart to beat faster (aerobic exercise) most days of the week. Activities may include walking, swimming, or biking. Include exercise to strengthen your muscles (resistance exercise), such as Pilates or lifting weights, as part of your weekly exercise routine. Try to do these types of exercises for 30 minutes at least 3 days a week. Do not use any products that contain nicotine or tobacco. These products include cigarettes, chewing tobacco, and vaping devices, such as e-cigarettes. If you need help quitting, ask your health care provider. Monitor your blood pressure at home as told by your health care provider. Keep all follow-up visits. This is important. Medicines Take over-the-counter and prescription medicines only as told by your health care provider. Follow directions carefully. Blood  pressure medicines must be taken as prescribed. Do not skip doses of blood pressure medicine. Doing this puts you at risk for problems and can make the medicine less effective. Ask your health care provider about side effects or reactions to medicines that you should watch for. Contact a health care provider if you: Think you are having a reaction to a medicine you are taking. Have headaches that keep coming back (recurring). Feel dizzy. Have swelling in your ankles. Have trouble with your vision. Get help right away if you: Develop a severe headache or confusion. Have unusual weakness or numbness. Feel faint. Have severe pain in your chest or abdomen. Vomit repeatedly. Have trouble breathing. These symptoms may be an emergency. Get help right away. Call 911. Do not wait to see if the symptoms will go away. Do not drive yourself to the hospital. Summary Hypertension is when the force of blood pumping through your arteries is too strong. If this condition is not controlled, it may put you at risk for serious complications. Your personal target blood pressure may vary depending on your medical conditions, your age, and other factors. For most people, a normal blood pressure is less than 120/80. Hypertension is treated with lifestyle changes, medicines, or a combination of both. Lifestyle changes include losing weight, eating a healthy,   low-sodium diet, exercising more, and limiting alcohol. This information is not intended to replace advice given to you by your health care provider. Make sure you discuss any questions you have with your health care provider. Document Revised: 03/27/2021 Document Reviewed: 03/27/2021 [employer] Patient Education  2023 [employer]  

## 2022-01-18 LAB — VITAMIN B12: Vitamin B-12: 449 pg/mL (ref 232–1245)

## 2022-02-12 ENCOUNTER — Other Ambulatory Visit: Payer: Self-pay | Admitting: Family Medicine

## 2022-02-12 DIAGNOSIS — G8929 Other chronic pain: Secondary | ICD-10-CM

## 2022-02-20 ENCOUNTER — Ambulatory Visit (INDEPENDENT_AMBULATORY_CARE_PROVIDER_SITE_OTHER): Payer: Medicare Other | Admitting: Family Medicine

## 2022-02-20 VITALS — BP 102/64 | HR 59 | Temp 97.8°F | Ht 63.0 in | Wt 103.4 lb

## 2022-02-20 DIAGNOSIS — F0280 Dementia in other diseases classified elsewhere without behavioral disturbance: Secondary | ICD-10-CM

## 2022-02-20 DIAGNOSIS — E538 Deficiency of other specified B group vitamins: Secondary | ICD-10-CM

## 2022-02-20 DIAGNOSIS — G309 Alzheimer's disease, unspecified: Secondary | ICD-10-CM | POA: Diagnosis not present

## 2022-02-20 DIAGNOSIS — G8929 Other chronic pain: Secondary | ICD-10-CM

## 2022-02-20 DIAGNOSIS — R5381 Other malaise: Secondary | ICD-10-CM

## 2022-02-20 DIAGNOSIS — M25551 Pain in right hip: Secondary | ICD-10-CM

## 2022-02-20 DIAGNOSIS — I10 Essential (primary) hypertension: Secondary | ICD-10-CM

## 2022-02-20 DIAGNOSIS — K591 Functional diarrhea: Secondary | ICD-10-CM

## 2022-02-20 NOTE — Progress Notes (Signed)
Subjective: CC: Diarrhea PCP: Raliegh Ip, DO Susan Bennett is a 81 y.o. female presenting to clinic today for:  1.  Diarrhea Her niece tells me today that she has been having diarrhea such that she is having fecal incontinence often.  She denies any blood in stool.  This does not seem to be precipitated by any foods and in fact they took away boost and other type of meal replacements to see if perhaps that was causing.  She is not currently treated with any probiotics.  She does not have any nausea, vomiting or abdominal pain reported  2.  Physical deconditioning, hip pain Patient with known right chronic hip pain.  She sees her orthopedist tomorrow.  She has history of injection therapy and this hip but she still complains of pain.  Her niece is worried that she is needing some strengthening exercises and asks if we can arrange home health physical therapy for her  3.  Alzheimer's dementia Patient's Alzheimer dementia is chronic and stable.  She is compliant with all meds.  Her daughter and niece help her with medication management, etc.   ROS: Per HPI  No Known Allergies Past Medical History:  Diagnosis Date   Chronic diarrhea    Contact dermatitis and other eczema, due to unspecified cause    Depressive disorder, not elsewhere classified    Osteoporosis, unspecified    Other and unspecified hyperlipidemia    Other B-complex deficiencies    Unspecified essential hypertension    Unspecified gastritis and gastroduodenitis without mention of hemorrhage    Unspecified vitamin D deficiency     Current Outpatient Medications:    benazepril (LOTENSIN) 40 MG tablet, Take 1 tablet (40 mg total) by mouth daily., Disp: 90 tablet, Rfl: 2   diclofenac (VOLTAREN) 50 MG EC tablet, TAKE 1 TABLET BY MOUTH TWICE DAILY AS NEEDED FOR PAIN (ONLY USE IF TYLENOL NOT WORKING), Disp: 60 tablet, Rfl: 1   DM-APAP-CPM (CORICIDIN HBP) 10-325-2 MG TABS, Take 1 tablet by mouth every 6 (six)  hours as needed (Every 6-8 hours as needed)., Disp: 30 tablet, Rfl: 0   donepezil (ARICEPT) 5 MG tablet, Take 1 tablet (5 mg total) by mouth at bedtime. For memory, Disp: 90 tablet, Rfl: 3   lidocaine (LIDODERM) 5 %, Place 1 patch onto the skin daily. Remove & Discard patch within 12 hours or as directed by MD, Disp: 30 patch, Rfl: 3   memantine (NAMENDA) 10 MG tablet, TAKE 1/2 (ONE-HALF) TABLET BY MOUTH QAM and 1 tablet every evening., Disp: 135 tablet, Rfl: 3   sertraline (ZOLOFT) 50 MG tablet, Take 1.5 tablets (75 mg total) by mouth daily., Disp: 135 tablet, Rfl: 3  Current Facility-Administered Medications:    cyanocobalamin ((VITAMIN B-12)) injection 1,000 mcg, 1,000 mcg, Intramuscular, Q30 days, Jones, Angel S, PA-C, 1,000 mcg at 12/07/21 1552 Social History   Socioeconomic History   Marital status: Divorced    Spouse name: Not on file   Number of children: 1   Years of education: Not on file   Highest education level: 12th grade  Occupational History   Occupation: retired   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  Tobacco Use   Smoking status: Former    Types: Cigarettes    Quit date: 02/20/2001    Years since quitting: 21.0   Smokeless tobacco: Never  Vaping Use   Vaping Use: Never used  Substance and Sexual Activity   Alcohol use: No  Drug use: No   Sexual activity: Not Currently  Other Topics Concern   Not on file  Social History Narrative   Lives alone in 1 story home with full basement   Has 1 adult child   Highest level of education: high school diploma   Retired Advertising account planner   Social Determinants of Health   Financial Resource Strain: Low Risk  (10/31/2021)   Overall Financial Resource Strain (CARDIA)    Difficulty of Paying Living Expenses: Not hard at all  Food Insecurity: No Food Insecurity (10/31/2021)   Hunger Vital Sign    Worried About Running Out of Food in the Last Year: Never true    Ran Out of Food in the Last Year: Never true   Transportation Needs: No Transportation Needs (10/31/2021)   PRAPARE - Administrator, Civil Service (Medical): No    Lack of Transportation (Non-Medical): No  Physical Activity: Insufficiently Active (10/31/2021)   Exercise Vital Sign    Days of Exercise per Week: 3 days    Minutes of Exercise per Session: 10 min  Stress: No Stress Concern Present (10/31/2021)   [employer] of Occupational Health - Occupational Stress Questionnaire    Feeling of Stress : Not at all  Social Connections: Socially Isolated (10/31/2021)   Social Connection and Isolation Panel [NHANES]    Frequency of Communication with Friends and Family: More than three times a week    Frequency of Social Gatherings with Friends and Family: More than three times a week    Attends Religious Services: Never    Database administrator or Organizations: No    Attends Banker Meetings: Never    Marital Status: Divorced  Catering manager Violence: Not At Risk (10/31/2021)   Humiliation, Afraid, Rape, and Kick questionnaire    Fear of Current or Ex-Partner: No    Emotionally Abused: No    Physically Abused: No    Sexually Abused: No   Family History  Problem Relation Age of Onset   Healthy Sister    Healthy Brother    Throat cancer Brother    Stroke Brother    Heart disease Brother    Throat cancer Maternal Aunt     Objective: Office vital signs reviewed. BP 104/72   Pulse (!) 59   Temp 97.8 F (36.6 C)   Ht 5\' 3"  (1.6 m)   Wt 103 lb 6.4 oz (46.9 kg)   SpO2 98%   BMI 18.32 kg/m   Physical Examination:  General: Awake, alert, thin, petite female, No acute distress HEENT: Sclera white.  Moist mucous membranes Cardio: regular rate and rhythm, S1S2 heard, no murmurs appreciated Pulm: clear to auscultation bilaterally, no wheezes, rhonchi or rales; normal work of breathing on room air Extremities: warm, well perfused, No edema, cyanosis or clubbing; +2 pulses bilaterally MSK: Unsteady  but independent gait.  Hunched station  Assessment/ Plan: 81 y.o. female   Alzheimer's dementia without behavioral disturbance (HCC) - Plan: Ambulatory referral to Home Health  Essential hypertension - Plan: Renal Function Panel  Vitamin B12 deficiency  Hip pain, chronic, right - Plan: Ambulatory referral to Home Health  Physical deconditioning - Plan: Ambulatory referral to Home Health  Functional diarrhea  Alzheimer's is stable.  She is having some physical deconditioning and ongoing chronic right hip pain.  Has an appoint with her orthopedist tomorrow but I am going to place some orders for home physical therapy.  Think she would benefit from this.  Blood sugar upon manual check was absolutely normal.  No changes to current regimen.  Check renal function  B12 shot was administered during today's visit.  There was some uncertainty as to whether or not she is getting B12 orally at home.  I am fine with her continuing these monthly if she desires.  For the diarrhea that she has been having, this seems to be a chronic issue and not precipitated by any specific foods.  I recommend starting a probiotic and adding yogurt with active cultures and it.  We will reassess in the next 3 to 4 months and if this is still ongoing, we may need to consider something like Xifaxan, Viberzi and stool studies.  No orders of the defined types were placed in this encounter.  No orders of the defined types were placed in this encounter.    Janora Norlander, DO [place] [phone]

## 2022-02-21 ENCOUNTER — Other Ambulatory Visit: Payer: Self-pay | Admitting: Physical Medicine and Rehabilitation

## 2022-02-21 ENCOUNTER — Encounter: Payer: Self-pay | Admitting: Orthopaedic Surgery

## 2022-02-21 ENCOUNTER — Ambulatory Visit (INDEPENDENT_AMBULATORY_CARE_PROVIDER_SITE_OTHER): Payer: Medicare Other | Admitting: Orthopaedic Surgery

## 2022-02-21 VITALS — Ht 63.0 in | Wt 103.0 lb

## 2022-02-21 DIAGNOSIS — M5416 Radiculopathy, lumbar region: Secondary | ICD-10-CM | POA: Diagnosis not present

## 2022-02-21 DIAGNOSIS — M5126 Other intervertebral disc displacement, lumbar region: Secondary | ICD-10-CM

## 2022-02-21 LAB — RENAL FUNCTION PANEL
Albumin: 3.9 g/dL (ref 3.7–4.7)
BUN/Creatinine Ratio: 10 — ABNORMAL LOW (ref 12–28)
BUN: 12 mg/dL (ref 8–27)
CO2: 22 mmol/L (ref 20–29)
Calcium: 8.9 mg/dL (ref 8.7–10.3)
Chloride: 108 mmol/L — ABNORMAL HIGH (ref 96–106)
Creatinine, Ser: 1.24 mg/dL — ABNORMAL HIGH (ref 0.57–1.00)
Glucose: 111 mg/dL — ABNORMAL HIGH (ref 70–99)
Phosphorus: 3.1 mg/dL (ref 3.0–4.3)
Potassium: 4.6 mmol/L (ref 3.5–5.2)
Sodium: 143 mmol/L (ref 134–144)
eGFR: 43 mL/min/{1.73_m2} — ABNORMAL LOW (ref >59)

## 2022-02-21 NOTE — Progress Notes (Signed)
Office Visit Note   Patient: Susan Bennett           Date of Birth: [birth year]           MRN: [id number] Visit Date: 02/21/2022              Requested by: Raliegh Ip, DO [address] [place],  [place] [address] PCP: Raliegh Ip, DO   Assessment & Plan: Visit Diagnoses:  1. Lumbar radiculopathy   2. Protrusion of lumbar intervertebral disc     Plan: Second ESI right L4-5 with Dr. Alvester Morin.  Patient states she does not want to consider surgery.  She can follow-up with me as needed after the injection.  Follow-Up Instructions: No follow-ups on file.   Orders:  Orders Placed This Encounter  Procedures   Ambulatory referral to Physical Medicine Rehab   No orders of the defined types were placed in this encounter.     Procedures: No procedures performed   Clinical Data: No additional findings.   Subjective: Chief Complaint  Patient presents with   Lower Back - Pain    HPI 81 year old female here with her daughter post epidural injection 01/09/2022 for right L4-5 disc protrusion with radiculopathy.  Patient states the injection really worked well for several weeks it is gradually returned and now she is doing as bad as she was before and is requesting a repeat injection.  Patient has some mild depression some memory problems.  Plain radiographs demonstrated some mild curvature and MRI showed broad-based disc protrusion on the right at L4-5 by MRI 01/20/2019.  Review of Systems all the systems noncontributory HPI.   Objective: Vital Signs: Ht 5\' 3"  (1.6 m)   Wt 103 lb (46.7 kg)   BMI 18.25 kg/m   Physical Exam Constitutional:      Appearance: She is well-developed.  HENT:     Head: Normocephalic.     Right Ear: External ear normal.     Left Ear: External ear normal. There is no impacted cerumen.  Eyes:     Pupils: Pupils are equal, round, and reactive to light.  Neck:     Thyroid: No thyromegaly.     Trachea: No tracheal deviation.  Cardiovascular:      Rate and Rhythm: Normal rate.  Pulmonary:     Effort: Pulmonary effort is normal.  Abdominal:     Palpations: Abdomen is soft.  Musculoskeletal:     Cervical back: No rigidity.  Skin:    General: Skin is warm and dry.  Neurological:     Mental Status: She is alert and oriented to person, place, and time.  Psychiatric:        Behavior: Behavior normal.     Ortho ExamPatient has some tenderness over the sciatic notch on the right no trochanteric bursal tenderness.  Negative logroll the hips.  Reflexes are intact.  Trace ankle dorsiflexion weakness on the right normal on the left.  Specialty Comments:  MRI LUMBAR SPINE WITHOUT CONTRAST     TECHNIQUE:  Multiplanar, multisequence MR imaging of the lumbar spine was  performed. No intravenous contrast was administered.     COMPARISON:  None.     FINDINGS:  Segmentation: There are 5 non-rib bearing lumbar type vertebral  bodies with the last intervertebral disc space labeled as L5-S1.     Alignment: There is a minimal retrolisthesis of L5 on S1. there is  also a minimal retrolisthesis of L2 on L3.  Vertebrae: The vertebral body heights are well maintained. There is  endplate reactive changes seen at L3-L4 and L5-S1.     Conus medullaris and cauda equina: Conus extends to the L1 level.  Conus and cauda equina appear normal.     Paraspinal and other soft tissues: There is a 1.7 cm cystic lesion  adjacent to the iliac vasculature best seen on series 6, image 46.  The remainder of the paraspinal soft tissues and visualized  retroperitoneal structures are unremarkable. The sacroiliac joints  are intact.     Disc levels:     T12-L1:  No significant canal or neural foraminal narrowing.     L1-L2: There is a broad-based disc bulge with a tiny focal central  disc protrusion. There is facet arthrosis and ligamentum flavum  hypertrophy. Mild effacement anterior thecal sac is seen.     L2-L3: There is a broad-based disc  bulge with facet arthrosis and  ligamentum flavum hypertrophy. Moderate right and mild left neural  foraminal narrowing is seen. There is mild effacement of the  anterior thecal sac.     L3-L4: There is a broad-based disc bulge with a right lateral  recess/foraminal disc protrusion which contacts the descending right  L4 nerve root. There is severe right and mild left neural foraminal  narrowing. The central thecal sac is effaced measuring 7 mm in AP  diameter.     L4-L5: There is a broad-based disc bulge with a right foraminal disc  protrusion which contacts the descending right L5 nerve root. There  is severe bilateral neural foraminal narrowing. The central thecal  sac measures 6 mm in AP diameter.     L5-S1: There is a broad-based diffuse bulge which is eccentric to  the left with facet arthrosis and ligamentum flavum hypertrophy.  Severe left and moderate right neural foraminal narrowing are seen.     IMPRESSION:  1. Minimal retrolisthesis of L2 on L3 and L5 on S1.  2. Lumbar spine spondylosis most notable at L3-L4 with a right  lateral recess/foraminal disc protrusion contacting the descending  right L4 nerve root with severe right neural foraminal narrowing and  mild to moderate central canal stenosis. Also at L4-L5 there is a  right foraminal disc protrusion contacting the descending right L5  nerve root with severe bilateral neural foraminal narrowing and  moderate central canal stenosis.        Electronically Signed    By: Prudencio Pair M.D.    On: 01/21/2019 14:26  Imaging: No results found.   PMFS History: Patient Active Problem List   Diagnosis Date Noted   Protrusion of lumbar intervertebral disc 11/15/2021   Alzheimer's dementia without behavioral disturbance ([facility]) 12/02/2016   Vitamin B12 deficiency 12/02/2016   Essential hypertension 04/19/2016   Mild episode of recurrent major depressive disorder ([place]) 04/19/2016   Past Medical History:  Diagnosis  Date   Chronic diarrhea    Contact dermatitis and other eczema, due to unspecified cause    Depressive disorder, not elsewhere classified    Osteoporosis, unspecified    Other and unspecified hyperlipidemia    Other B-complex deficiencies    Unspecified essential hypertension    Unspecified gastritis and gastroduodenitis without mention of hemorrhage    Unspecified vitamin D deficiency     Family History  Problem Relation Age of Onset   Healthy Sister    Healthy Brother    Throat cancer Brother    Stroke Brother    Heart disease Brother  Throat cancer Maternal Aunt     Past Surgical History:  Procedure Laterality Date   COLONOSCOPY     9 YEARS AGO   Social History   Occupational History   Occupation: retired   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

## 2022-02-25 ENCOUNTER — Telehealth: Payer: Self-pay | Admitting: Family Medicine

## 2022-02-25 DIAGNOSIS — E538 Deficiency of other specified B group vitamins: Secondary | ICD-10-CM | POA: Diagnosis not present

## 2022-02-28 ENCOUNTER — Ambulatory Visit (INDEPENDENT_AMBULATORY_CARE_PROVIDER_SITE_OTHER): Payer: Medicare Other | Admitting: Family Medicine

## 2022-02-28 ENCOUNTER — Encounter: Payer: Self-pay | Admitting: Family Medicine

## 2022-02-28 ENCOUNTER — Telehealth: Payer: Self-pay | Admitting: Family Medicine

## 2022-02-28 DIAGNOSIS — R399 Unspecified symptoms and signs involving the genitourinary system: Secondary | ICD-10-CM

## 2022-02-28 DIAGNOSIS — M810 Age-related osteoporosis without current pathological fracture: Secondary | ICD-10-CM | POA: Diagnosis not present

## 2022-02-28 DIAGNOSIS — F0283 Dementia in other diseases classified elsewhere, unspecified severity, with mood disturbance: Secondary | ICD-10-CM | POA: Diagnosis not present

## 2022-02-28 DIAGNOSIS — I10 Essential (primary) hypertension: Secondary | ICD-10-CM | POA: Diagnosis not present

## 2022-02-28 DIAGNOSIS — M5126 Other intervertebral disc displacement, lumbar region: Secondary | ICD-10-CM | POA: Diagnosis not present

## 2022-02-28 DIAGNOSIS — Z9181 History of falling: Secondary | ICD-10-CM | POA: Diagnosis not present

## 2022-02-28 DIAGNOSIS — Z87891 Personal history of nicotine dependence: Secondary | ICD-10-CM | POA: Diagnosis not present

## 2022-02-28 DIAGNOSIS — E538 Deficiency of other specified B group vitamins: Secondary | ICD-10-CM | POA: Diagnosis not present

## 2022-02-28 DIAGNOSIS — M25551 Pain in right hip: Secondary | ICD-10-CM | POA: Diagnosis not present

## 2022-02-28 DIAGNOSIS — G309 Alzheimer's disease, unspecified: Secondary | ICD-10-CM | POA: Diagnosis not present

## 2022-02-28 DIAGNOSIS — G8929 Other chronic pain: Secondary | ICD-10-CM | POA: Diagnosis not present

## 2022-02-28 DIAGNOSIS — F330 Major depressive disorder, recurrent, mild: Secondary | ICD-10-CM | POA: Diagnosis not present

## 2022-02-28 LAB — MICROSCOPIC EXAMINATION
RBC, Urine: NONE SEEN /hpf (ref 0–2)
Renal Epithel, UA: NONE SEEN /hpf

## 2022-02-28 LAB — URINALYSIS, COMPLETE
Bilirubin, UA: NEGATIVE
Glucose, UA: NEGATIVE
Ketones, UA: NEGATIVE
Leukocytes,UA: NEGATIVE
Nitrite, UA: NEGATIVE
RBC, UA: NEGATIVE
Specific Gravity, UA: >1.03 — ABNORMAL HIGH (ref 1.005–1.030)
Urobilinogen, Ur: 0.2 mg/dL (ref 0.2–1.0)
pH, UA: 5.5 (ref 5.0–7.5)

## 2022-02-28 NOTE — Telephone Encounter (Signed)
Pt

## 2022-02-28 NOTE — Telephone Encounter (Signed)
Pts daughter has been made aware and understood.

## 2022-02-28 NOTE — Progress Notes (Signed)
Virtual Visit via telephone Note  I connected with Susan Bennett on 02/28/22 at 1417 by telephone and verified that I am speaking with the correct person using two identifiers. Susan Bennett is currently located at home and guardian are currently with her during visit. The provider, Fransisca Kaufmann Orlean Holtrop, MD is located in their office at time of visit.  Call ended at 1422  I discussed the limitations, risks, security and privacy concerns of performing an evaluation and management service by telephone and the availability of in person appointments. I also discussed with the patient that there may be a patient responsible charge related to this service. The patient expressed understanding and agreed to proceed.   History and Present Illness: Patient is calling in for urinary frequency and urgency and leakage and her pajamas are wet and believe she has a UTI.  She has been having the symptoms for at least a few days.  Her caregiver is the one given the history.  Her caregiver is concerned that she has a UTI because she has been having more frequency and leaking and accidents which she does not normally have.  She says otherwise she has been acting normally for her but she does have Alzheimer's and her memory is not great anyways.  1. UTI symptoms     Outpatient Encounter Medications as of 02/28/2022  Medication Sig   benazepril (LOTENSIN) 40 MG tablet Take 1 tablet (40 mg total) by mouth daily.   diclofenac (VOLTAREN) 50 MG EC tablet TAKE 1 TABLET BY MOUTH TWICE DAILY AS NEEDED FOR PAIN (ONLY USE IF TYLENOL NOT WORKING)   DM-APAP-CPM (CORICIDIN HBP) 10-325-2 MG TABS Take 1 tablet by mouth every 6 (six) hours as needed (Every 6-8 hours as needed).   donepezil (ARICEPT) 5 MG tablet Take 1 tablet (5 mg total) by mouth at bedtime. For memory   lidocaine (LIDODERM) 5 % Place 1 patch onto the skin daily. Remove & Discard patch within 12 hours or as directed by MD   memantine (NAMENDA) 10 MG tablet TAKE  1/2 (ONE-HALF) TABLET BY MOUTH QAM and 1 tablet every evening.   sertraline (ZOLOFT) 50 MG tablet Take 1.5 tablets (75 mg total) by mouth daily.   Facility-Administered Encounter Medications as of 02/28/2022  Medication   cyanocobalamin ((VITAMIN B-12)) injection 1,000 mcg    Review of Systems  Constitutional:  Negative for chills and fever.  Eyes:  Negative for visual disturbance.  Respiratory:  Negative for chest tightness and shortness of breath.   Cardiovascular:  Negative for chest pain and leg swelling.  Gastrointestinal:  Negative for abdominal pain.  Genitourinary:  Positive for frequency and urgency. Negative for decreased urine volume, difficulty urinating, dysuria and hematuria.  Musculoskeletal:  Negative for back pain and gait problem.  Skin:  Negative for rash.  Neurological:  Negative for light-headedness and headaches.  Psychiatric/Behavioral:  Negative for agitation and behavioral problems.   All other systems reviewed and are negative.   Observations/Objective: History was provided by guardian  Urinalysis: Trace protein, 0-5 WBCs and 0-10 epithelial cells and few bacteria and mucus present.  Assessment and Plan: Problem List Items Addressed This Visit   None Visit Diagnoses     UTI symptoms    -  Primary   Relevant Orders   Urinalysis, Complete   Urine Culture     No signs of UTI on urinalysis, will await culture.  They can use Azo and hydration.  Follow up plan: Return if symptoms  worsen or fail to improve.     I discussed the assessment and treatment plan with the patient. The patient was provided an opportunity to ask questions and all were answered. The patient agreed with the plan and demonstrated an understanding of the instructions.   The patient was advised to call back or seek an in-person evaluation if the symptoms worsen or if the condition fails to improve as anticipated.  The above assessment and management plan was discussed with the  patient. The patient verbalized understanding of and has agreed to the management plan. Patient is aware to call the clinic if symptoms persist or worsen. Patient is aware when to return to the clinic for a follow-up visit. Patient educated on when it is appropriate to go to the emergency department.    I provided 5 minutes of non-face-to-face time during this encounter.    Worthy Rancher, MD

## 2022-02-28 NOTE — Telephone Encounter (Signed)
Please let the patient know that her initial urine sample was normal, we have sent off a culture but that will not come back until early next week.  If any symptoms change then let us know.  It does not appear that she has a UTI based on this

## 2022-03-02 LAB — URINE CULTURE: Organism ID, Bacteria: NO GROWTH

## 2022-03-04 DIAGNOSIS — M25551 Pain in right hip: Secondary | ICD-10-CM | POA: Diagnosis not present

## 2022-03-04 DIAGNOSIS — F330 Major depressive disorder, recurrent, mild: Secondary | ICD-10-CM | POA: Diagnosis not present

## 2022-03-04 DIAGNOSIS — F0283 Dementia in other diseases classified elsewhere, unspecified severity, with mood disturbance: Secondary | ICD-10-CM | POA: Diagnosis not present

## 2022-03-04 DIAGNOSIS — M5126 Other intervertebral disc displacement, lumbar region: Secondary | ICD-10-CM | POA: Diagnosis not present

## 2022-03-04 DIAGNOSIS — G8929 Other chronic pain: Secondary | ICD-10-CM | POA: Diagnosis not present

## 2022-03-04 DIAGNOSIS — G309 Alzheimer's disease, unspecified: Secondary | ICD-10-CM | POA: Diagnosis not present

## 2022-03-06 DIAGNOSIS — G309 Alzheimer's disease, unspecified: Secondary | ICD-10-CM | POA: Diagnosis not present

## 2022-03-06 DIAGNOSIS — G8929 Other chronic pain: Secondary | ICD-10-CM | POA: Diagnosis not present

## 2022-03-06 DIAGNOSIS — F0283 Dementia in other diseases classified elsewhere, unspecified severity, with mood disturbance: Secondary | ICD-10-CM | POA: Diagnosis not present

## 2022-03-06 DIAGNOSIS — F330 Major depressive disorder, recurrent, mild: Secondary | ICD-10-CM | POA: Diagnosis not present

## 2022-03-06 DIAGNOSIS — M5126 Other intervertebral disc displacement, lumbar region: Secondary | ICD-10-CM | POA: Diagnosis not present

## 2022-03-06 DIAGNOSIS — M25551 Pain in right hip: Secondary | ICD-10-CM | POA: Diagnosis not present

## 2022-03-08 DIAGNOSIS — F330 Major depressive disorder, recurrent, mild: Secondary | ICD-10-CM | POA: Diagnosis not present

## 2022-03-08 DIAGNOSIS — M25551 Pain in right hip: Secondary | ICD-10-CM | POA: Diagnosis not present

## 2022-03-08 DIAGNOSIS — G8929 Other chronic pain: Secondary | ICD-10-CM | POA: Diagnosis not present

## 2022-03-08 DIAGNOSIS — M5126 Other intervertebral disc displacement, lumbar region: Secondary | ICD-10-CM | POA: Diagnosis not present

## 2022-03-08 DIAGNOSIS — G309 Alzheimer's disease, unspecified: Secondary | ICD-10-CM | POA: Diagnosis not present

## 2022-03-08 DIAGNOSIS — F0283 Dementia in other diseases classified elsewhere, unspecified severity, with mood disturbance: Secondary | ICD-10-CM | POA: Diagnosis not present

## 2022-03-11 DIAGNOSIS — M25551 Pain in right hip: Secondary | ICD-10-CM | POA: Diagnosis not present

## 2022-03-11 DIAGNOSIS — G309 Alzheimer's disease, unspecified: Secondary | ICD-10-CM | POA: Diagnosis not present

## 2022-03-11 DIAGNOSIS — F330 Major depressive disorder, recurrent, mild: Secondary | ICD-10-CM | POA: Diagnosis not present

## 2022-03-11 DIAGNOSIS — F0283 Dementia in other diseases classified elsewhere, unspecified severity, with mood disturbance: Secondary | ICD-10-CM | POA: Diagnosis not present

## 2022-03-11 DIAGNOSIS — G8929 Other chronic pain: Secondary | ICD-10-CM | POA: Diagnosis not present

## 2022-03-11 DIAGNOSIS — M5126 Other intervertebral disc displacement, lumbar region: Secondary | ICD-10-CM | POA: Diagnosis not present

## 2022-03-13 DIAGNOSIS — M25551 Pain in right hip: Secondary | ICD-10-CM | POA: Diagnosis not present

## 2022-03-13 DIAGNOSIS — G8929 Other chronic pain: Secondary | ICD-10-CM | POA: Diagnosis not present

## 2022-03-13 DIAGNOSIS — M5126 Other intervertebral disc displacement, lumbar region: Secondary | ICD-10-CM | POA: Diagnosis not present

## 2022-03-13 DIAGNOSIS — G309 Alzheimer's disease, unspecified: Secondary | ICD-10-CM | POA: Diagnosis not present

## 2022-03-13 DIAGNOSIS — F330 Major depressive disorder, recurrent, mild: Secondary | ICD-10-CM | POA: Diagnosis not present

## 2022-03-13 DIAGNOSIS — F0283 Dementia in other diseases classified elsewhere, unspecified severity, with mood disturbance: Secondary | ICD-10-CM | POA: Diagnosis not present

## 2022-03-18 DIAGNOSIS — F0283 Dementia in other diseases classified elsewhere, unspecified severity, with mood disturbance: Secondary | ICD-10-CM | POA: Diagnosis not present

## 2022-03-18 DIAGNOSIS — M5126 Other intervertebral disc displacement, lumbar region: Secondary | ICD-10-CM | POA: Diagnosis not present

## 2022-03-18 DIAGNOSIS — G309 Alzheimer's disease, unspecified: Secondary | ICD-10-CM | POA: Diagnosis not present

## 2022-03-18 DIAGNOSIS — F330 Major depressive disorder, recurrent, mild: Secondary | ICD-10-CM | POA: Diagnosis not present

## 2022-03-18 DIAGNOSIS — M25551 Pain in right hip: Secondary | ICD-10-CM | POA: Diagnosis not present

## 2022-03-18 DIAGNOSIS — G8929 Other chronic pain: Secondary | ICD-10-CM | POA: Diagnosis not present

## 2022-03-21 DIAGNOSIS — G8929 Other chronic pain: Secondary | ICD-10-CM | POA: Diagnosis not present

## 2022-03-21 DIAGNOSIS — M25551 Pain in right hip: Secondary | ICD-10-CM | POA: Diagnosis not present

## 2022-03-21 DIAGNOSIS — F330 Major depressive disorder, recurrent, mild: Secondary | ICD-10-CM | POA: Diagnosis not present

## 2022-03-21 DIAGNOSIS — F0283 Dementia in other diseases classified elsewhere, unspecified severity, with mood disturbance: Secondary | ICD-10-CM | POA: Diagnosis not present

## 2022-03-21 DIAGNOSIS — M5126 Other intervertebral disc displacement, lumbar region: Secondary | ICD-10-CM | POA: Diagnosis not present

## 2022-03-21 DIAGNOSIS — G309 Alzheimer's disease, unspecified: Secondary | ICD-10-CM | POA: Diagnosis not present

## 2022-03-22 DIAGNOSIS — M25551 Pain in right hip: Secondary | ICD-10-CM | POA: Diagnosis not present

## 2022-03-22 DIAGNOSIS — M5126 Other intervertebral disc displacement, lumbar region: Secondary | ICD-10-CM | POA: Diagnosis not present

## 2022-03-22 DIAGNOSIS — F330 Major depressive disorder, recurrent, mild: Secondary | ICD-10-CM | POA: Diagnosis not present

## 2022-03-22 DIAGNOSIS — G309 Alzheimer's disease, unspecified: Secondary | ICD-10-CM | POA: Diagnosis not present

## 2022-03-22 DIAGNOSIS — F0283 Dementia in other diseases classified elsewhere, unspecified severity, with mood disturbance: Secondary | ICD-10-CM | POA: Diagnosis not present

## 2022-03-22 DIAGNOSIS — G8929 Other chronic pain: Secondary | ICD-10-CM | POA: Diagnosis not present

## 2022-03-25 DIAGNOSIS — G309 Alzheimer's disease, unspecified: Secondary | ICD-10-CM | POA: Diagnosis not present

## 2022-03-25 DIAGNOSIS — G8929 Other chronic pain: Secondary | ICD-10-CM | POA: Diagnosis not present

## 2022-03-25 DIAGNOSIS — M25551 Pain in right hip: Secondary | ICD-10-CM | POA: Diagnosis not present

## 2022-03-25 DIAGNOSIS — F0283 Dementia in other diseases classified elsewhere, unspecified severity, with mood disturbance: Secondary | ICD-10-CM | POA: Diagnosis not present

## 2022-03-25 DIAGNOSIS — M5126 Other intervertebral disc displacement, lumbar region: Secondary | ICD-10-CM | POA: Diagnosis not present

## 2022-03-25 DIAGNOSIS — F330 Major depressive disorder, recurrent, mild: Secondary | ICD-10-CM | POA: Diagnosis not present

## 2022-03-27 ENCOUNTER — Ambulatory Visit (INDEPENDENT_AMBULATORY_CARE_PROVIDER_SITE_OTHER): Payer: Medicare Other

## 2022-03-27 DIAGNOSIS — G309 Alzheimer's disease, unspecified: Secondary | ICD-10-CM

## 2022-03-27 DIAGNOSIS — M5126 Other intervertebral disc displacement, lumbar region: Secondary | ICD-10-CM | POA: Diagnosis not present

## 2022-03-27 DIAGNOSIS — F0283 Dementia in other diseases classified elsewhere, unspecified severity, with mood disturbance: Secondary | ICD-10-CM

## 2022-03-27 DIAGNOSIS — M810 Age-related osteoporosis without current pathological fracture: Secondary | ICD-10-CM

## 2022-03-27 DIAGNOSIS — F330 Major depressive disorder, recurrent, mild: Secondary | ICD-10-CM | POA: Diagnosis not present

## 2022-03-27 DIAGNOSIS — G8929 Other chronic pain: Secondary | ICD-10-CM | POA: Diagnosis not present

## 2022-03-27 DIAGNOSIS — M25551 Pain in right hip: Secondary | ICD-10-CM

## 2022-03-27 DIAGNOSIS — E538 Deficiency of other specified B group vitamins: Secondary | ICD-10-CM | POA: Diagnosis not present

## 2022-03-27 DIAGNOSIS — I10 Essential (primary) hypertension: Secondary | ICD-10-CM | POA: Diagnosis not present

## 2022-03-28 ENCOUNTER — Encounter: Payer: Self-pay | Admitting: Nurse Practitioner

## 2022-03-28 ENCOUNTER — Ambulatory Visit (INDEPENDENT_AMBULATORY_CARE_PROVIDER_SITE_OTHER): Payer: Medicare Other | Admitting: Nurse Practitioner

## 2022-03-28 VITALS — BP 187/89 | HR 64 | Temp 97.9°F | Resp 20 | Ht 63.0 in | Wt 102.0 lb

## 2022-03-28 DIAGNOSIS — R21 Rash and other nonspecific skin eruption: Secondary | ICD-10-CM

## 2022-03-28 DIAGNOSIS — G8929 Other chronic pain: Secondary | ICD-10-CM | POA: Diagnosis not present

## 2022-03-28 DIAGNOSIS — F330 Major depressive disorder, recurrent, mild: Secondary | ICD-10-CM | POA: Diagnosis not present

## 2022-03-28 DIAGNOSIS — M25551 Pain in right hip: Secondary | ICD-10-CM | POA: Diagnosis not present

## 2022-03-28 DIAGNOSIS — M5126 Other intervertebral disc displacement, lumbar region: Secondary | ICD-10-CM | POA: Diagnosis not present

## 2022-03-28 DIAGNOSIS — G309 Alzheimer's disease, unspecified: Secondary | ICD-10-CM | POA: Diagnosis not present

## 2022-03-28 DIAGNOSIS — F0283 Dementia in other diseases classified elsewhere, unspecified severity, with mood disturbance: Secondary | ICD-10-CM | POA: Diagnosis not present

## 2022-03-28 MED ORDER — METHYLPREDNISOLONE ACETATE 80 MG/ML IJ SUSP
80.0000 mg | Freq: Once | INTRAMUSCULAR | Status: AC
  Administered 2022-03-28: 80 mg via INTRAMUSCULAR

## 2022-03-28 NOTE — Progress Notes (Signed)
   Subjective:    Patient ID: Susan Bennett, female    DOB: 1940/09/19, 81 y.o.   MRN: [id number]   Chief Complaint: rash  Rash Pertinent negatives include no eye pain or shortness of breath.   Patient is brought in by a friend. Patient is c/oitchy rash all over. Denies any new soaps or detergents. Started a couple of days ago. Has been taking banadryl with no help.    Review of Systems  Constitutional:  Negative for diaphoresis.  Eyes:  Negative for pain.  Respiratory:  Negative for shortness of breath.   Cardiovascular:  Negative for chest pain, palpitations and leg swelling.  Gastrointestinal:  Negative for abdominal pain.  Endocrine: Negative for polydipsia.  Skin:  Positive for rash.  Neurological:  Negative for dizziness, weakness and headaches.  Hematological:  Does not bruise/bleed easily.  All other systems reviewed and are negative.      Objective:   Physical Exam Constitutional:      Appearance: Normal appearance.  Cardiovascular:     Rate and Rhythm: Normal rate and regular rhythm.  Pulmonary:     Breath sounds: Normal breath sounds.  Skin:    General: Skin is warm.     Comments: Fine erythematous rash with diffuse pattern on body.  Neurological:     General: No focal deficit present.     Mental Status: She is alert and oriented to person, place, and time.   BP (!) 187/89   Pulse 64   Temp 97.9 F (36.6 C) (Temporal)   Resp 20   Ht 5\' 3"  (1.6 m)   Wt 102 lb (46.3 kg)   BMI 18.07 kg/m          Assessment & Plan:  Susan Bennett in today with chief complaint of Rash   1. Rash Continue benadryl for itching Avoid scratching RTO prn - methylPREDNISolone acetate (DEPO-MEDROL) injection 80 mg    The above assessment and management plan was discussed with the patient. The patient verbalized understanding of and has agreed to the management plan. Patient is aware to call the clinic if symptoms persist or worsen. Patient is aware when to return to  the clinic for a follow-up visit. Patient educated on when it is appropriate to go to the emergency department.   Mary-Margaret Hassell Done, FNP

## 2022-03-28 NOTE — Patient Instructions (Signed)
SARNA LOTION  Pruritus Pruritus is an itchy feeling on the skin. One of the most common causes is dry skin, but many different things can cause itching. Most cases of itching do not require medical attention. Sometimes itchy skin can turn into a rash or a secondary infection. Follow these instructions at home: Skin care  Do not use scented soaps, detergents, perfumes, and cosmetic products. Instead, use gentle, unscented versions of these items. Apply moisturizing creams to your skin frequently, at least twice daily. Apply immediately after bathing while skin is still wet. Take medicines or apply medicated creams only as told by your health care provider. This may include: Corticosteroid cream or topical calcineurin inhibitor. Anti-itch lotions containing urea, camphor, or menthol. Oral antihistamines. Do not take hot showers or baths, which can make itching worse. A short, cool shower may help with itching as long as you apply moisturizing lotion after the shower. Apply a cool, wet cloth (cool compress) to the affected areas. You may take lukewarm baths with one of the following: Epsom salts. You can get these at your local pharmacy or grocery store. Follow the instructions on the packaging. Baking soda. Pour a small amount into the bath as told by your health care provider. Colloidal oatmeal. You can get this at your local pharmacy or grocery store. Follow the instructions on the packaging. Do not scratch your skin. General instructions Avoid wearing tight clothes. Keep a journal to help find out what is causing your itching. Write down: What you eat and drink. What cosmetic products you use. What soaps or detergents you use. What you wear, including jewelry. Use a humidifier. This keeps the air moist, which helps to prevent dry skin. Be aware of any changes in your itchiness. Tell your health care provider about any changes. Contact a health care provider if: The itching does not go  away after several days. You notice redness, warmth, or drainage on the skin where you have scratched. You are unusually thirsty or urinating more than normal. Your skin tingles or feels numb. Your skin or the white parts of your eyes turn yellow (jaundice). You feel weak. You have any of the following: Night sweats. Tiredness (fatigue). Weight loss. Abdominal pain. Summary Pruritus is an itchy feeling on the skin. One of the most common causes is dry skin, but many different conditions and factors can cause itching. Apply moisturizing creams to your skin frequently, at least twice daily. Apply immediately after bathing while skin is still wet. Take medicines or apply medicated creams only as told by your health care provider. Do not take hot showers or baths. Do not use scented soaps, detergents, perfumes, or cosmetic products. Keep a journal to help find out what is causing your itching. This information is not intended to replace advice given to you by your health care provider. Make sure you discuss any questions you have with your health care provider. Document Revised: 06/27/2021 Document Reviewed: 06/27/2021 Elsevier Patient Education  2023 [employer]  

## 2022-03-30 DIAGNOSIS — G8929 Other chronic pain: Secondary | ICD-10-CM | POA: Diagnosis not present

## 2022-03-30 DIAGNOSIS — F0283 Dementia in other diseases classified elsewhere, unspecified severity, with mood disturbance: Secondary | ICD-10-CM | POA: Diagnosis not present

## 2022-03-30 DIAGNOSIS — M5126 Other intervertebral disc displacement, lumbar region: Secondary | ICD-10-CM | POA: Diagnosis not present

## 2022-03-30 DIAGNOSIS — Z87891 Personal history of nicotine dependence: Secondary | ICD-10-CM | POA: Diagnosis not present

## 2022-03-30 DIAGNOSIS — M25551 Pain in right hip: Secondary | ICD-10-CM | POA: Diagnosis not present

## 2022-03-30 DIAGNOSIS — I10 Essential (primary) hypertension: Secondary | ICD-10-CM | POA: Diagnosis not present

## 2022-03-30 DIAGNOSIS — E538 Deficiency of other specified B group vitamins: Secondary | ICD-10-CM | POA: Diagnosis not present

## 2022-03-30 DIAGNOSIS — M810 Age-related osteoporosis without current pathological fracture: Secondary | ICD-10-CM | POA: Diagnosis not present

## 2022-03-30 DIAGNOSIS — Z9181 History of falling: Secondary | ICD-10-CM | POA: Diagnosis not present

## 2022-03-30 DIAGNOSIS — F330 Major depressive disorder, recurrent, mild: Secondary | ICD-10-CM | POA: Diagnosis not present

## 2022-03-30 DIAGNOSIS — G309 Alzheimer's disease, unspecified: Secondary | ICD-10-CM | POA: Diagnosis not present

## 2022-04-03 ENCOUNTER — Ambulatory Visit (INDEPENDENT_AMBULATORY_CARE_PROVIDER_SITE_OTHER): Payer: Medicare Other | Admitting: Family Medicine

## 2022-04-03 ENCOUNTER — Encounter: Payer: Self-pay | Admitting: Family Medicine

## 2022-04-03 VITALS — BP 141/70 | HR 76 | Temp 98.7°F | Ht 63.0 in | Wt 103.0 lb

## 2022-04-03 DIAGNOSIS — L282 Other prurigo: Secondary | ICD-10-CM | POA: Diagnosis not present

## 2022-04-03 DIAGNOSIS — F0280 Dementia in other diseases classified elsewhere without behavioral disturbance: Secondary | ICD-10-CM | POA: Diagnosis not present

## 2022-04-03 DIAGNOSIS — R32 Unspecified urinary incontinence: Secondary | ICD-10-CM | POA: Diagnosis not present

## 2022-04-03 DIAGNOSIS — G309 Alzheimer's disease, unspecified: Secondary | ICD-10-CM

## 2022-04-03 MED ORDER — LEVOCETIRIZINE DIHYDROCHLORIDE 5 MG PO TABS: 5.0000 mg | ORAL_TABLET | Freq: Every evening | ORAL | 0 refills | Status: DC

## 2022-04-03 MED ORDER — HYDROXYZINE HCL 10 MG PO TABS: 10.0000 mg | ORAL_TABLET | Freq: Two times a day (BID) | ORAL | 0 refills | Status: DC | PRN

## 2022-04-03 MED ORDER — FAMOTIDINE 20 MG PO TABS: 20.0000 mg | ORAL_TABLET | Freq: Two times a day (BID) | ORAL | 0 refills | Status: DC

## 2022-04-03 MED ORDER — MEMANTINE HCL 10 MG PO TABS: 10.0000 mg | ORAL_TABLET | Freq: Two times a day (BID) | ORAL | 1 refills | Status: AC

## 2022-04-03 NOTE — Progress Notes (Signed)
Subjective:    Patient ID: Susan Bennett, female    DOB: [birth year], 81 y.o.   MRN: [id number]   Chief Complaint: Rash (Patient was seen 10/26 and states it is no better. )   Pt here today for worsening rash/itching as noted below. CG accompanying pt states memory is getting worse and pt has been "urinating all over everything" the last 3 or 4 days. Pt denies any urinary symptoms.  Rash This is a new problem. The current episode started in the past 7 days. The problem has been gradually worsening since onset. The rash is diffuse. The rash is characterized by dryness, itchiness and redness. It is unknown (pt was on amoxicillin last week for dental procedure, unsure if she has had it before) if there was an exposure to a precipitant. Pertinent negatives include no congestion, cough, facial edema, rhinorrhea or shortness of breath. (itching) Past treatments include antihistamine and moisturizer (pt seen in this office last week and received IM depo-medrol). The treatment provided no relief. There is no history of allergies, asthma, eczema or varicella.       Review of Systems  HENT:  Negative for congestion, facial swelling, rhinorrhea and sneezing.   Respiratory:  Negative for cough, shortness of breath and wheezing.   Cardiovascular:  Negative for chest pain.  Skin:  Positive for rash.       pruritis  All other systems reviewed and are negative.      Objective:   Physical Exam Constitutional:      General: She is not in acute distress.    Appearance: Normal appearance. She is not ill-appearing.  HENT:     Nose: Nose normal.     Mouth/Throat:     Mouth: Mucous membranes are moist.     Pharynx: Oropharynx is clear.  Eyes:     Conjunctiva/sclera: Conjunctivae normal.     Pupils: Pupils are equal, round, and reactive to light.  Cardiovascular:     Rate and Rhythm: Normal rate and regular rhythm.     Heart sounds: Normal heart sounds.  Pulmonary:     Effort: Pulmonary effort is  normal. No respiratory distress.     Breath sounds: Normal breath sounds. No wheezing, rhonchi or rales.  Skin:    General: Skin is warm and dry.     Findings: No rash.     Comments: Skin dry and flaky; no rash noted  Neurological:     Mental Status: She is alert.  Psychiatric:        Mood and Affect: Mood normal.        Behavior: Behavior normal.      BP (!) 141/70   Pulse 76   Temp 98.7 F (37.1 C) (Temporal)   Ht 5\' 3"  (1.6 m)   Wt 103 lb (46.7 kg)   SpO2 98%   BMI 18.25 kg/m       Assessment & Plan:   Susan Bennett in today with chief complaint of Rash (Patient was seen 10/26 and states it is no better. )   1. Pruritic rash No rash noted. Skin dry and flaky. Avoid hot showers/baths. Use emollient cream to moisturize dry skin. - levocetirizine (XYZAL) 5 MG tablet; Take 1 tablet (5 mg total) by mouth every evening.  Dispense: 30 tablet; Refill: 0 - famotidine (PEPCID) 20 MG tablet; Take 1 tablet (20 mg total) by mouth 2 (two) times daily for 14 days.  Dispense: 28 tablet; Refill: 0 - hydrOXYzine (ATARAX) 10  MG tablet; Take 1 tablet (10 mg total) by mouth 2 (two) times daily as needed for itching.  Dispense: 30 tablet; Refill: 0  2. Urinary incontinence in female U/A pending; will treat as warranted. - Urinalysis - Urine Culture  3. Alzheimer's dementia without behavioral disturbance ([place]) Increased namenda to 10mg ; follow-up with [facility] in 4 weeks. - memantine (NAMENDA) 10 MG tablet; Take 1 tablet (10 mg total) by mouth 2 (two) times daily.  Dispense: 60 tablet; Refill: 1    The above assessment and management plan was discussed with the patient. The patient verbalized understanding of and has agreed to the management plan. Patient is aware to call the clinic if symptoms persist or worsen. Patient is aware when to return to the clinic for a follow-up visit. Patient educated on when it is appropriate to go to the emergency department.   Collene Leyden, FNP  student  I personally was present during the history, physical exam, and medical decision-making activities of this visit and have verified that the services and findings are accurately documented in the nurse practitioner student's note.  Monia Pouch, FNP-C [facility] [address] [place], [place] [address] [phone]

## 2022-04-04 DIAGNOSIS — G8929 Other chronic pain: Secondary | ICD-10-CM | POA: Diagnosis not present

## 2022-04-04 DIAGNOSIS — M25551 Pain in right hip: Secondary | ICD-10-CM | POA: Diagnosis not present

## 2022-04-04 DIAGNOSIS — F330 Major depressive disorder, recurrent, mild: Secondary | ICD-10-CM | POA: Diagnosis not present

## 2022-04-04 DIAGNOSIS — F0283 Dementia in other diseases classified elsewhere, unspecified severity, with mood disturbance: Secondary | ICD-10-CM | POA: Diagnosis not present

## 2022-04-04 DIAGNOSIS — G309 Alzheimer's disease, unspecified: Secondary | ICD-10-CM | POA: Diagnosis not present

## 2022-04-04 DIAGNOSIS — M5126 Other intervertebral disc displacement, lumbar region: Secondary | ICD-10-CM | POA: Diagnosis not present

## 2022-04-04 LAB — URINALYSIS
Bilirubin, UA: NEGATIVE
Glucose, UA: NEGATIVE
Ketones, UA: NEGATIVE
Leukocytes,UA: NEGATIVE
Nitrite, UA: NEGATIVE
Protein,UA: NEGATIVE
RBC, UA: NEGATIVE
Specific Gravity, UA: >1.03 — ABNORMAL HIGH (ref 1.005–1.030)
Urobilinogen, Ur: 0.2 mg/dL (ref 0.2–1.0)
pH, UA: 5 (ref 5.0–7.5)

## 2022-04-05 LAB — URINE CULTURE

## 2022-04-08 DIAGNOSIS — G8929 Other chronic pain: Secondary | ICD-10-CM | POA: Diagnosis not present

## 2022-04-08 DIAGNOSIS — M25551 Pain in right hip: Secondary | ICD-10-CM | POA: Diagnosis not present

## 2022-04-08 DIAGNOSIS — F330 Major depressive disorder, recurrent, mild: Secondary | ICD-10-CM | POA: Diagnosis not present

## 2022-04-08 DIAGNOSIS — M5126 Other intervertebral disc displacement, lumbar region: Secondary | ICD-10-CM | POA: Diagnosis not present

## 2022-04-08 DIAGNOSIS — F0283 Dementia in other diseases classified elsewhere, unspecified severity, with mood disturbance: Secondary | ICD-10-CM | POA: Diagnosis not present

## 2022-04-08 DIAGNOSIS — G309 Alzheimer's disease, unspecified: Secondary | ICD-10-CM | POA: Diagnosis not present

## 2022-04-12 ENCOUNTER — Other Ambulatory Visit: Payer: Self-pay | Admitting: Family Medicine

## 2022-04-12 DIAGNOSIS — I10 Essential (primary) hypertension: Secondary | ICD-10-CM

## 2022-04-16 DIAGNOSIS — L308 Other specified dermatitis: Secondary | ICD-10-CM | POA: Diagnosis not present

## 2022-05-09 ENCOUNTER — Encounter: Payer: Self-pay | Admitting: Family Medicine

## 2022-05-09 ENCOUNTER — Ambulatory Visit (INDEPENDENT_AMBULATORY_CARE_PROVIDER_SITE_OTHER): Payer: Medicare Other | Admitting: Family Medicine

## 2022-05-09 VITALS — BP 181/69 | HR 65 | Temp 97.8°F | Ht 63.0 in | Wt 108.2 lb

## 2022-05-09 DIAGNOSIS — L282 Other prurigo: Secondary | ICD-10-CM | POA: Diagnosis not present

## 2022-05-09 DIAGNOSIS — T7840XD Allergy, unspecified, subsequent encounter: Secondary | ICD-10-CM

## 2022-05-09 MED ORDER — HYDROXYZINE HCL 10 MG PO TABS: 10.0000 mg | ORAL_TABLET | Freq: Three times a day (TID) | ORAL | 0 refills | Status: DC | PRN

## 2022-05-09 MED ORDER — EPINEPHRINE 0.3 MG/0.3ML IJ SOAJ: 0.3000 mg | INTRAMUSCULAR | 1 refills | Status: DC | PRN

## 2022-05-09 NOTE — Progress Notes (Signed)
Subjective:  Patient ID: Susan Bennett, female    DOB: 1941/03/20, 81 y.o.   MRN: [id number]  Patient Care Team: Raliegh Ip, DO as PCP - General (Family Medicine)   Chief Complaint:  Rash (Patient has been seen twice for the rash and states that it is better but she is still having itching from abd up )   HPI: Susan Bennett is a 81 y.o. female presenting on 05/09/2022 for Rash (Patient has been seen twice for the rash and states that it is better but she is still having itching from abd up )   Rash This is a recurrent problem. Episode onset: several months ago. The problem has been waxing and waning since onset. The rash is diffuse. The rash is characterized by itchiness and redness. It is unknown if there was an exposure to a precipitant. Pertinent negatives include no anorexia, congestion, cough, diarrhea, eye pain, facial edema, fatigue, fever, joint pain, nail changes, rhinorrhea, shortness of breath, sore throat or vomiting. Past treatments include antihistamine, anti-itch cream, cold compress, moisturizer, oral steroids and topical steroids (seen by dermatology and has follow up with allergist scheduled). The treatment provided no relief.    Relevant past medical, surgical, family, and social history reviewed and updated as indicated.  Allergies and medications reviewed and updated. Data reviewed: Chart in Epic.   Past Medical History:  Diagnosis Date   Chronic diarrhea    Contact dermatitis and other eczema, due to unspecified cause    Depressive disorder, not elsewhere classified    Osteoporosis, unspecified    Other and unspecified hyperlipidemia    Other B-complex deficiencies    Unspecified essential hypertension    Unspecified gastritis and gastroduodenitis without mention of hemorrhage    Unspecified vitamin D deficiency     Past Surgical History:  Procedure Laterality Date   COLONOSCOPY     9 YEARS AGO    Social History   Socioeconomic History    Marital status: Divorced    Spouse name: Not on file   Number of children: 1   Years of education: Not on file   Highest education level: 12th grade  Occupational History   Occupation: retired   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  Tobacco Use   Smoking status: Former    Types: Cigarettes    Quit date: 02/20/2001    Years since quitting: 21.2   Smokeless tobacco: Never  Vaping Use   Vaping Use: Never used  Substance and Sexual Activity   Alcohol use: No   Drug use: No   Sexual activity: Not Currently  Other Topics Concern   Not on file  Social History Narrative   Lives alone in 1 story home with full basement   Has 1 adult child   Highest level of education: high school diploma   Retired Advertising account planner   Social Determinants of Corporate investment banker Strain: Low Risk  (10/31/2021)   Overall Financial Resource Strain (CARDIA)    Difficulty of Paying Living Expenses: Not hard at all  Food Insecurity: No Food Insecurity (10/31/2021)   Hunger Vital Sign    Worried About Running Out of Food in the Last Year: Never true    Ran Out of Food in the Last Year: Never true  Transportation Needs: No Transportation Needs (10/31/2021)   PRAPARE - Administrator, Civil Service (Medical): No    Lack of Transportation (Non-Medical): No  Physical  Activity: Insufficiently Active (10/31/2021)   Exercise Vital Sign    Days of Exercise per Week: 3 days    Minutes of Exercise per Session: 10 min  Stress: No Stress Concern Present (10/31/2021)   [employer] - Occupational Stress Questionnaire    Feeling of Stress : Not at all  Social Connections: Socially Isolated (10/31/2021)   Social Connection and Isolation Panel [NHANES]    Frequency of Communication with Friends and Family: More than three times a week    Frequency of Social Gatherings with Friends and Family: More than three times a week    Attends Religious Services: Never     Database administratorActive Member of Clubs or Organizations: No    Attends BankerClub or Organization Meetings: Never    Marital Status: Divorced  Catering managerntimate Partner Violence: Not At Risk (10/31/2021)   Humiliation, Afraid, Rape, and Kick questionnaire    Fear of Current or Ex-Partner: No    Emotionally Abused: No    Physically Abused: No    Sexually Abused: No    Outpatient Encounter Medications as of 05/09/2022  Medication Sig   benazepril (LOTENSIN) 40 MG tablet Take 1 tablet (40 mg total) by mouth daily.   diclofenac (VOLTAREN) 50 MG EC tablet TAKE 1 TABLET BY MOUTH TWICE DAILY AS NEEDED FOR PAIN (ONLY USE IF TYLENOL NOT WORKING)   DM-APAP-CPM (CORICIDIN HBP) 10-325-2 MG TABS Take 1 tablet by mouth every 6 (six) hours as needed (Every 6-8 hours as needed).   donepezil (ARICEPT) 5 MG tablet Take 1 tablet (5 mg total) by mouth at bedtime. For memory   EPINEPHrine 0.3 mg/0.3 mL IJ SOAJ injection Inject 0.3 mg into the muscle as needed for anaphylaxis.   hydrOXYzine (ATARAX) 10 MG tablet Take 1 tablet (10 mg total) by mouth 3 (three) times daily as needed.   levocetirizine (XYZAL) 5 MG tablet Take 1 tablet (5 mg total) by mouth every evening.   lidocaine (LIDODERM) 5 % Place 1 patch onto the skin daily. Remove & Discard patch within 12 hours or as directed by MD   memantine (NAMENDA) 10 MG tablet Take 1 tablet (10 mg total) by mouth 2 (two) times daily.   sertraline (ZOLOFT) 50 MG tablet Take 1.5 tablets (75 mg total) by mouth daily.   [DISCONTINUED] hydrOXYzine (ATARAX) 10 MG tablet Take 1 tablet (10 mg total) by mouth 2 (two) times daily as needed for itching.   famotidine (PEPCID) 20 MG tablet Take 1 tablet (20 mg total) by mouth 2 (two) times daily for 14 days.   Facility-Administered Encounter Medications as of 05/09/2022  Medication   cyanocobalamin ((VITAMIN B-12)) injection 1,000 mcg    No Known Allergies  Review of Systems  Constitutional:  Negative for fatigue and fever.  HENT:  Negative for  congestion, rhinorrhea and sore throat.   Eyes:  Negative for pain.  Respiratory:  Negative for cough and shortness of breath.   Gastrointestinal:  Negative for anorexia, diarrhea and vomiting.  Musculoskeletal:  Negative for joint pain.  Skin:  Positive for rash. Negative for nail changes.        Objective:  BP (!) 181/69   Pulse 65   Temp 97.8 F (36.6 C) (Temporal)   Ht 5\' 3"  (1.6 m)   Wt 108 lb 3.2 oz (49.1 kg)   SpO2 99%   BMI 19.17 kg/m    Wt Readings from Last 3 Encounters:  05/09/22 108 lb 3.2 oz (49.1 kg)  04/03/22 103 lb (  46.7 kg)  03/28/22 102 lb (46.3 kg)    Physical Exam Vitals and nursing note reviewed.  Constitutional:      General: She is not in acute distress.    Appearance: Normal appearance. She is not ill-appearing, toxic-appearing or diaphoretic.  HENT:     Head: Normocephalic and atraumatic.     Nose: Nose normal. No congestion or rhinorrhea.     Mouth/Throat:     Mouth: Mucous membranes are moist.  Eyes:     Conjunctiva/sclera: Conjunctivae normal.     Pupils: Pupils are equal, round, and reactive to light.  Cardiovascular:     Rate and Rhythm: Normal rate and regular rhythm.     Heart sounds: Normal heart sounds.  Pulmonary:     Effort: Pulmonary effort is normal. No respiratory distress.     Breath sounds: Normal breath sounds. No stridor. No wheezing, rhonchi or rales.  Chest:     Chest wall: No tenderness.  Abdominal:     General: Bowel sounds are normal. There is no distension.     Palpations: Abdomen is soft.  Musculoskeletal:     Cervical back: Neck supple.     Right lower leg: No edema.     Left lower leg: No edema.  Skin:    General: Skin is warm and dry.     Capillary Refill: Capillary refill takes less than 2 seconds.     Findings: Erythema and rash present. Rash is urticarial.     Comments: Fine, erythematous rash, diffuse pattern  Neurological:     General: No focal deficit present.     Mental Status: She is alert.  Mental status is at baseline.     Motor: No weakness.     Gait: Gait normal.  Psychiatric:        Attention and Perception: Attention normal.        Speech: Speech normal.        Behavior: Behavior is cooperative.        Cognition and Memory: Cognition is impaired. Memory is impaired.     Results for orders placed or performed in visit on 04/03/22  Urine Culture   Specimen: Urine   UR  Result Value Ref Range   Urine Culture, Routine Final report    Organism ID, Bacteria Comment   Urinalysis  Result Value Ref Range   Specific Gravity, UA >1.030 (H) 1.005 - 1.030   pH, UA 5.0 5.0 - 7.5   Color, UA Yellow Yellow   Appearance Ur Cloudy (A) Clear   Leukocytes,UA Negative Negative   Protein,UA Negative Negative/Trace   Glucose, UA Negative Negative   Ketones, UA Negative Negative   RBC, UA Negative Negative   Bilirubin, UA Negative Negative   Urobilinogen, Ur 0.2 0.2 - 1.0 mg/dL   Nitrite, UA Negative Negative       Pertinent labs & imaging results that were available during my care of the patient were reviewed by me and considered in my medical decision making.  Assessment & Plan:  Denese was seen today for rash.  Diagnoses and all orders for this visit:  Allergic reaction, subsequent encounter Pruritic rash No indications of anaphylaxis but due to recurrent symptoms, will provide epi pen. Do not want to burst with steroids as pt has a follow up with allergist next week. Will provide atarax as needed for pruritus, sedation precautions provided.   -     EPINEPHrine 0.3 mg/0.3 mL IJ SOAJ injection; Inject 0.3 mg into the  muscle as needed for anaphylaxis. -     hydrOXYzine (ATARAX) 10 MG tablet; Take 1 tablet (10 mg total) by mouth 3 (three) times daily as needed.     Continue all other maintenance medications.  Follow up plan: Return if symptoms worsen or fail to improve.   Continue healthy lifestyle choices, including diet (rich in fruits, vegetables, and lean  proteins, and low in salt and simple carbohydrates) and exercise (at least 30 minutes of moderate physical activity daily).  Educational handout given for allergies  The above assessment and management plan was discussed with the patient. The patient verbalized understanding of and has agreed to the management plan. Patient is aware to call the clinic if they develop any new symptoms or if symptoms persist or worsen. Patient is aware when to return to the clinic for a follow-up visit. Patient educated on when it is appropriate to go to the emergency department.   Kari Baars, FNP-C [facility] [phone]

## 2022-05-15 ENCOUNTER — Telehealth: Payer: Self-pay | Admitting: Allergy & Immunology

## 2022-05-15 ENCOUNTER — Ambulatory Visit (INDEPENDENT_AMBULATORY_CARE_PROVIDER_SITE_OTHER): Payer: Medicare Other | Admitting: Allergy & Immunology

## 2022-05-15 ENCOUNTER — Encounter: Payer: Self-pay | Admitting: Allergy & Immunology

## 2022-05-15 ENCOUNTER — Other Ambulatory Visit: Payer: Self-pay

## 2022-05-15 ENCOUNTER — Telehealth: Payer: Self-pay | Admitting: *Deleted

## 2022-05-15 VITALS — BP 122/70 | HR 57 | Temp 97.9°F | Resp 16 | Ht 63.0 in | Wt 106.8 lb

## 2022-05-15 DIAGNOSIS — L500 Allergic urticaria: Secondary | ICD-10-CM

## 2022-05-15 DIAGNOSIS — L508 Other urticaria: Secondary | ICD-10-CM | POA: Diagnosis not present

## 2022-05-15 MED ORDER — DOXEPIN HCL 10 MG PO CAPS: 10.0000 mg | ORAL_CAPSULE | Freq: Every day | ORAL | 5 refills | Status: DC

## 2022-05-15 MED ORDER — FAMOTIDINE 20 MG PO TABS: 20.0000 mg | ORAL_TABLET | Freq: Two times a day (BID) | ORAL | 5 refills | Status: DC

## 2022-05-15 MED ORDER — FEXOFENADINE HCL 180 MG PO TABS: 180.0000 mg | ORAL_TABLET | Freq: Two times a day (BID) | ORAL | 5 refills | Status: DC

## 2022-05-15 NOTE — Patient Instructions (Addendum)
1. Chronic urticaria - Testing was negative to the most common foods as well as the environmental allergens. - This rules out >95% of all food allergies, making a food allergy unlikely. - Copy of testing results provided. - Your history does not have any "red flags" such as fevers, joint pains, or permanent skin changes that would be concerning for a more serious cause of hives.  - We will get some labs to rule out serious causes of hives: alpha gal panel, complete blood count, tryptase level, chronic urticaria panel, CMP, ESR, and CRP. - We will call you in 1-2 weeks with the results of the testing today.  - Apply Vanicream 2-3 times daily to moisturizing the skin. - Apply Epiceram twice daily as needed to help with itching.  - Chronic hives are often times a self limited process and will "burn themselves out" over 6-12 months, although this is not always the case.  - In the meantime, start suppressive dosing of antihistamines:   - Morning: Allegra (fexofenadine) 140m + Pepcid (famotidine) 229m - Evening: Allegra (fexofenadine) 18037m Pepcid (famotidine) 72m62mdoxepin 25mg11mSingulair (montelukast) 10mg 37mYou can change this dosing at home, decreasing the dose as needed or increasing the dosing as needed.  - If you are not tolerating the medications or are tired of taking them every day, we can start treatment with a monthly injectable medication called Xolair.  - Information on Xolair provided today.   2. Return in about 4 weeks (around 06/12/2022).    Please inform us of [facility] visits, hospitalizations, or changes in symptoms. Call us befKoreae going to the ED for breathing or allergy symptoms since we might be able to fit you in for a sick visit. Feel free to contact us anyKoreame with any questions, problems, or concerns.  It was a pleasure to meet you guys today!  Websites that have reliable patient information: 1. American Academy of Asthma, Allergy, and  Immunology: www.aaaai.org 2. Food Allergy Research and Education (FARE): foodallergy.org 3. Mothers of Asthmatics: http://www.asthmacommunitynetwork.org 4. American College of Allergy, Asthma, and Immunology: www.acaai.org   COVID-19 Vaccine Information can be found at: [email or web address] related to vaccine distribution or appointments, please email [email or web address] or call [phone] realize that you might be concerned about having an allergic reaction to the COVID19 vaccines. To help with that concern, WE ARE OFFERING THE COVID19 VACCINES IN OUR OFFICE! Ask the front desk for dates!     "Like" us on Koreacebook and Instagram for our latest updates!      A healthy democracy works best when ALL voNew York Life Insurancecipate! Make sure you are registered to vote! If you have moved or changed any of your contact information, you will need to get this updated before voting!  In some cases, you MAY be able to register to vote online: [email or web address] Adult Perc - 05/15/22 1030     Time Antigen Placed 1015    Allergen Manufacturer Greer    Location Back    Number of Test 59    1. Control-Buffer 50% Glycerol Negative    2. Control-Histamine 1 mg/ml 3+    3. Albumin saline Negative    4. Bahia Barbertonive    5. BermudGuatemalaive    6. Johnson Negative    7. KentucEast Hampton NorthNegative    8. Meadow Fescue Negative    9. Perennial Rye Negative    10.  Sweet Vernal Negative    11. Timothy Negative    12. Cocklebur Negative    13. Burweed Marshelder Negative    14. Ragweed, short Negative    15. Ragweed, Giant Negative    16. Plantain,  English Negative    17. Lamb's Quarters Negative    18. Sheep Sorrell Negative    19. Rough Pigweed Negative    20. Marsh Elder, Rough Negative    21. Mugwort, Common Negative    22. Ash mix Negative    23. Birch mix Negative    24.  Beech American Negative    25. Box, Elder Negative    26. Cedar, red Negative    27. Cottonwood, Eastern Negative    28. Elm mix Negative    29. Hickory Negative    30. Maple mix Negative    31. Oak, Eastern mix Negative    32. Pecan Pollen Negative    33. Pine mix Negative    34. Sycamore Eastern Negative    35. Walnut, Black Pollen Negative    36. Alternaria alternata Negative    37. Cladosporium Herbarum Negative    38. Aspergillus mix Negative    39. Penicillium mix Negative    40. Bipolaris sorokiniana (Helminthosporium) Negative    41. Drechslera spicifera (Curvularia) Negative    42. Mucor plumbeus Negative    43. Fusarium moniliforme Negative    44. Aureobasidium pullulans (pullulara) Negative    45. Rhizopus oryzae Negative    46. Botrytis cinera Negative    47. Epicoccum nigrum Negative    48. Phoma betae Negative    49. Candida Albicans Negative    50. Trichophyton mentagrophytes Negative    51. Mite, D Farinae  5,000 AU/ml Negative    52. Mite, D Pteronyssinus  5,000 AU/ml Negative    53. Cat Hair 10,000 BAU/ml Negative    54.  Dog Epithelia Negative    55. Mixed Feathers Negative    56. Horse Epithelia Negative    57. Cockroach, German Negative    58. Mouse Negative    59. Tobacco Leaf Negative             Food Perc - 05/15/22 1031       Test Information   Time Antigen Placed 1015    Allergen Manufacturer Greer    Location Back    Number of allergen test 10      Food   1. Peanut Negative    2. Soybean food Negative    3. Wheat, whole Negative    4. Sesame Negative    5. Milk, cow Negative    6. Egg White, chicken Negative    7. Casein Negative    8. Shellfish mix Negative    9. Fish mix Negative    10. Cashew Negative                   

## 2022-05-15 NOTE — Telephone Encounter (Signed)
Called and spoke to daughter and advised with patient Ins will be buy and bill and scheduled for start for 12/20 in [place]

## 2022-05-15 NOTE — Telephone Encounter (Signed)
-----   Message from Alfonse Spruce, MD sent at 05/15/2022  1:09 PM EST ----- Interested in starting xolair for CIU. Signed consent. Was not really interested in playing around with more antihistamines.

## 2022-05-15 NOTE — Telephone Encounter (Signed)
Please advise in change

## 2022-05-15 NOTE — Progress Notes (Signed)
NEW PATIENT  Date of Service/Encounter:  05/15/22  Consult requested by: Janora Norlander, DO   Assessment:   Chronic urticaria - with negative skin testing (blood work pending)  No biopsy performed   I am unsure what is causing her urticaria. She did not start any new medications at all and there are no new exposures. Skin testing was unrevealing today. She has been on a number of antihistamines with minimal improvement. We are going to advance to Xolair to see if this can provide some relief. She is going to continue with antihistamines in the meantime and we are going to try to get her itching under control. Hopefully we can improve her quality of life.   Plan/Recommendations:   1. Chronic urticaria - Testing was negative to the most common foods as well as the environmental allergens. - This rules out >95% of all food allergies, making a food allergy unlikely. - Copy of testing results provided. - Your history does not have any "red flags" such as fevers, joint pains, or permanent skin changes that would be concerning for a more serious cause of hives.  - We will get some labs to rule out serious causes of hives: alpha gal panel, complete blood count, tryptase level, chronic urticaria panel, CMP, ESR, and CRP. - We will call you in 1-2 weeks with the results of the testing today.  - Apply Vanicream 2-3 times daily to moisturizing the skin. - Apply Epiceram twice daily as needed to help with itching.  - Chronic hives are often times a self limited process and will "burn themselves out" over 6-12 months, although this is not always the case.  - In the meantime, start suppressive dosing of antihistamines:   - Morning: Allegra (fexofenadine) 116m + Pepcid (famotidine) 256m - Evening: Allegra (fexofenadine) 18051m Pepcid (famotidine) 20m12mdoxepin 25mg11mSingulair (montelukast) 10mg 82mYou can change this dosing at home, decreasing the dose as needed or increasing the  dosing as needed.  - If you are not tolerating the medications or are tired of taking them every day, we can start treatment with a monthly injectable medication called Xolair.  - Information on Xolair provided today.   2. Return in about 4 weeks (around 06/12/2022).    This note in its entirety was forwarded to the Provider who requested this consultation.  Subjective:   Kelsay KCHASEY DULL81 y.o40female presenting today for evaluation of  Chief Complaint  Patient presents with   Pruritus    Constant scratching for the past 2 months and getting worse     Lovelle KADAMARIZ GILLOTT history of the following: Patient Active Problem List   Diagnosis Date Noted   Protrusion of lumbar intervertebral disc 11/15/2021   Alzheimer's dementia without behavioral disturbance (HCC) 0[place]2/2018   Vitamin B12 deficiency 12/02/2016   Essential hypertension 04/19/2016   Mild episode of recurrent major depressive disorder (HCC) 1[place]7/2017    History obtained from: chart review and patient and her caregiver .  Donte KSilvio Pateeferred by GottscJanora Norlander    Mahala [id number] y.o36female presenting for an evaluation of urticaria and pruritus .  She has been having these issues for 2-3 months. They are unsure when this all started. She has a whole body. She does not notice that it gets worse with a particular food. It did not seem to get worse with any particular trigger. She  went to everyone she could think of. She saw a PA at Dr. Juel Burrow office. No biopsy. Nothing has worked at all including Benadryl. She did not start any new medicines at the time that it started. She did have her teeth pulled out around that time and she got amoxicillin. She did not start any regular medication at the time. She lives alone. She has tried Benadryl as well as hydroxyzine and Zyrtec and triamcinolone without improvement. She has not been on montelukast.   She has had no throat swelling or stomach pain and breathing  problems. She has only had symptoms isolated to her skin. She does not have any new joint pain and no fevers. She has no autoimmune history.   This is the first time that this has ever happened. She had this 8 years ago or so. This was a very similar rash at that time. No one ever did the allergy testing at that time and it seemingly cleared up on its own. This episode is largely devoid of details, as his caregiver was not with him at the time.   Otherwise, there is no history of other atopic diseases, including asthma, food allergies, drug allergies, stinging insect allergies, or contact dermatitis. There is no significant infectious history. Vaccinations are up to date.    Past Medical History: Patient Active Problem List   Diagnosis Date Noted   Protrusion of lumbar intervertebral disc 11/15/2021   Alzheimer's dementia without behavioral disturbance ([place]) 12/02/2016   Vitamin B12 deficiency 12/02/2016   Essential hypertension 04/19/2016   Mild episode of recurrent major depressive disorder ([place]) 04/19/2016    Medication List:  Allergies as of 05/15/2022   No Known Allergies      Medication List        Accurate as of May 15, 2022 12:58 PM. If you have any questions, ask your nurse or doctor.          benazepril 40 MG tablet Commonly known as: LOTENSIN Take 1 tablet (40 mg total) by mouth daily.   Coricidin HBP 10-325-2 MG Tabs Generic drug: DM-APAP-CPM Take 1 tablet by mouth every 6 (six) hours as needed (Every 6-8 hours as needed).   diclofenac 50 MG EC tablet Commonly known as: VOLTAREN TAKE 1 TABLET BY MOUTH TWICE DAILY AS NEEDED FOR PAIN (ONLY USE IF TYLENOL NOT WORKING)   donepezil 5 MG tablet Commonly known as: Aricept Take 1 tablet (5 mg total) by mouth at bedtime. For memory   EPINEPHrine 0.3 mg/0.3 mL Soaj injection Commonly known as: EPI-PEN Inject 0.3 mg into the muscle as needed for anaphylaxis.   famotidine 20 MG tablet Commonly known as:  Pepcid Take 1 tablet (20 mg total) by mouth 2 (two) times daily for 14 days.   hydrOXYzine 10 MG tablet Commonly known as: ATARAX Take 1 tablet (10 mg total) by mouth 3 (three) times daily as needed.   levocetirizine 5 MG tablet Commonly known as: XYZAL Take 1 tablet (5 mg total) by mouth every evening.   lidocaine 5 % Commonly known as: Lidoderm Place 1 patch onto the skin daily. Remove & Discard patch within 12 hours or as directed by MD   memantine 10 MG tablet Commonly known as: NAMENDA Take 1 tablet (10 mg total) by mouth 2 (two) times daily.   sertraline 50 MG tablet Commonly known as: Zoloft Take 1.5 tablets (75 mg total) by mouth daily.   triamcinolone cream 0.1 % Commonly known as: KENALOG SMARTSIG:1 Application Topical 2-3  Times Daily        Birth History: non-contributory  Developmental History: non-contributory  Past Surgical History: Past Surgical History:  Procedure Laterality Date   COLONOSCOPY     9 YEARS AGO     Family History: Family History  Problem Relation Age of Onset   Healthy Sister    Healthy Brother    Throat cancer Brother    Stroke Brother    Heart disease Brother    Throat cancer Maternal Aunt      Social History: Thea lives at home by herself. She lives in a house that was built in 1965. There is hardwood through the home. There are no animals inside or outside of the home. There are no dust mite coverings on the bedding. 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 There are no fume, chemical, or dust exposures in the home.    Review of Systems  Constitutional: Negative.  Negative for chills, fever, malaise/fatigue and weight loss.  HENT: Negative.  Negative for congestion, ear discharge and ear pain.   Eyes:  Negative for pain, discharge and redness.  Respiratory:  Negative for cough, sputum production, shortness of breath and wheezing.   Cardiovascular: Negative.  Negative for chest pain and  palpitations.  Gastrointestinal:  Negative for abdominal pain, heartburn, nausea and vomiting.  Skin:  Positive for itching and rash.  Neurological:  Negative for dizziness and headaches.  Endo/Heme/Allergies:  Negative for environmental allergies. Does not bruise/bleed easily.       Objective:   Blood pressure 122/70, pulse (!) 57, temperature 97.9 F (36.6 C), resp. rate 16, height _0  (1.6 m), weight 106 lb 12.8 oz (48.4 kg), SpO2 97 %. Body mass index is 18.92 kg/m.     Physical Exam Vitals reviewed.  Constitutional:      Appearance: She is well-developed.     Comments: Mostly responsive to questions, although she is hard of hearing.   HENT:     Head: Normocephalic and atraumatic.     Right Ear: Tympanic membrane, ear canal and external ear normal. No drainage, swelling or tenderness. Tympanic membrane is not injected, scarred, erythematous, retracted or bulging.     Left Ear: Tympanic membrane, ear canal and external ear normal. No drainage, swelling or tenderness. Tympanic membrane is not injected, scarred, erythematous, retracted or bulging.     Nose: No nasal deformity, septal deviation, mucosal edema or rhinorrhea.     Right Turbinates: Enlarged.     Left Turbinates: Enlarged.     Right Sinus: No maxillary sinus tenderness or frontal sinus tenderness.     Left Sinus: No maxillary sinus tenderness or frontal sinus tenderness.     Mouth/Throat:     Mouth: Mucous membranes are not pale and not dry.     Pharynx: Uvula midline.  Eyes:     General:        Right eye: No discharge.        Left eye: No discharge.     Conjunctiva/sclera: Conjunctivae normal.     Right eye: Right conjunctiva is not injected. No chemosis.    Left eye: Left conjunctiva is not injected. No chemosis.    Pupils: Pupils are equal, round, and reactive to light.  Cardiovascular:     Rate and Rhythm: Normal rate and regular rhythm.     Heart sounds: Normal heart sounds.  Pulmonary:     Effort:  Pulmonary effort is normal. No tachypnea, accessory muscle usage or  respiratory distress.     Breath sounds: Normal breath sounds. No wheezing, rhonchi or rales.  Chest:     Chest wall: No tenderness.  Abdominal:     Tenderness: There is no abdominal tenderness. There is no guarding or rebound.  Lymphadenopathy:     Head:     Right side of head: No submandibular, tonsillar or occipital adenopathy.     Left side of head: No submandibular, tonsillar or occipital adenopathy.     Cervical: No cervical adenopathy.  Skin:    General: Skin is warm.     Capillary Refill: Capillary refill takes less than 2 seconds.     Coloration: Skin is not pale.     Findings: Rash present. No abrasion, erythema or petechiae. Rash is urticarial. Rash is not papular or vesicular.     Comments: Urticarial lesions over her entire body, sparing areas where she cannot reach (including her upper back).   Neurological:     Mental Status: She is alert.  Psychiatric:        Behavior: Behavior is cooperative.      Diagnostic studies:   Allergy Studies:     Airborne Adult Perc - 05/15/22 1030     Time Antigen Placed 1015    Allergen Manufacturer Lavella Hammock    Location Back    Number of Test 59    1. Control-Buffer 50% Glycerol Negative    2. Control-Histamine 1 mg/ml 3+    3. Albumin saline Negative    4. [place] Negative    5. [place] Negative    6. [place] Negative    7. [place] Blue Negative    8. Meadow Fescue Negative    9. Perennial Rye Negative    10. Sweet Vernal Negative    11. Timothy Negative    12. Cocklebur Negative    13. Burweed Marshelder Negative    14. Ragweed, short Negative    15. Ragweed, Giant Negative    16. Plantain,  English Negative    17. Lamb's Quarters Negative    18. Sheep Sorrell Negative    19. Rough Pigweed Negative    20. Marsh Elder, Rough Negative    21. Mugwort, Common Negative    22. Ash mix Negative    23. Birch mix Negative    24. Beech American Negative     25. Box, Elder Negative    26. Cedar, red Negative    27. Cottonwood, [place] Negative    28. Elm mix Negative    29. Hickory Negative    30. Maple mix Negative    31. Oak, [place] mix Negative    32. Pecan Pollen Negative    33. Pine mix Negative    34. Sycamore Eastern Negative    35. [place], Black Pollen Negative    36. Alternaria alternata Negative    37. Cladosporium Herbarum Negative    38. Aspergillus mix Negative    39. Penicillium mix Negative    40. Bipolaris sorokiniana (Helminthosporium) Negative    41. Drechslera spicifera (Curvularia) Negative    42. Mucor plumbeus Negative    43. Fusarium moniliforme Negative    44. Aureobasidium pullulans (pullulara) Negative    45. Rhizopus oryzae Negative    46. Botrytis cinera Negative    47. Epicoccum nigrum Negative    48. Phoma betae Negative    49. Candida Albicans Negative    50. Trichophyton mentagrophytes Negative    51. Mite, D Farinae  5,000 AU/ml Negative  52. Mite, D Pteronyssinus  5,000 AU/ml Negative    53. Cat Hair 10,000 BAU/ml Negative    54.  Dog Epithelia Negative    55. Mixed Feathers Negative    56. Horse Epithelia Negative    57. Cockroach, German Negative    58. Mouse Negative    59. Tobacco Leaf Negative             Food Perc - 05/15/22 1031       Test Information   Time Antigen Placed 4035    Allergen Manufacturer Lavella Hammock    Location Back    Number of allergen test 10      Food   1. Peanut Negative    2. Soybean food Negative    3. Wheat, whole Negative    4. Sesame Negative    5. Milk, cow Negative    6. Egg White, chicken Negative    7. Casein Negative    8. Shellfish mix Negative    9. Fish mix Negative    10. Cashew Negative             Allergy testing results were read and interpreted by myself, documented by clinical staff.         Salvatore Marvel, MD [facility]

## 2022-05-15 NOTE — Addendum Note (Signed)
Addended by: Elsworth Soho on: 05/15/2022 05:00 PM   Modules accepted: Orders

## 2022-05-15 NOTE — Telephone Encounter (Signed)
Patient care giver called and said that the Kenalog was $5600.00 and they need something else. Drug store in [place]. [phone]

## 2022-05-16 MED ORDER — MONTELUKAST SODIUM 10 MG PO TABS: 10.0000 mg | ORAL_TABLET | Freq: Every day | ORAL | 5 refills | Status: DC

## 2022-05-16 NOTE — Addendum Note (Signed)
Addended by: Orson Aloe on: 05/16/2022 10:56 AM   Modules accepted: Orders

## 2022-05-16 NOTE — Telephone Encounter (Signed)
Called the pharmacy and they were not much help. They showed them the cost of a cream that was written on a paper... the estimated cost was 5,600 without the prescription. The pharmacist could not tell me what cream they gave the estimate for. I did inform them the triamcinolone cream was sent in my another doctor on 04/16/22. Both the patient and the pharmacist verbalized understanding and she was able to pick up all medication sent in by Dr. Dellis Anes with no issues. The Cream is from the Dermatologist per patient.

## 2022-05-16 NOTE — Telephone Encounter (Signed)
This is the triamcinolone cream? That CAN NOT be right. Can someone call the pharmacy to confirm?   Malachi Bonds, MD [facility]

## 2022-05-17 MED ORDER — TRIAMCINOLONE ACETONIDE 0.1 % EX OINT: 1.0000 | TOPICAL_OINTMENT | Freq: Two times a day (BID) | CUTANEOUS | 0 refills | Status: DC

## 2022-05-17 NOTE — Telephone Encounter (Signed)
I will try sending in triamcinolone ointment instead.   Malachi Bonds, MD [facility]

## 2022-05-21 NOTE — Telephone Encounter (Signed)
Ok great! Thanks, [place]!   Malachi Bonds, MD [facility]

## 2022-05-22 ENCOUNTER — Ambulatory Visit (INDEPENDENT_AMBULATORY_CARE_PROVIDER_SITE_OTHER): Payer: Medicare Other

## 2022-05-22 ENCOUNTER — Ambulatory Visit: Payer: Medicare Other | Admitting: Allergy & Immunology

## 2022-05-22 DIAGNOSIS — L501 Idiopathic urticaria: Secondary | ICD-10-CM

## 2022-05-22 LAB — CBC WITH DIFFERENTIAL
Basophils Absolute: 0 10*3/uL (ref 0.0–0.2)
Basos: 1 %
EOS (ABSOLUTE): 0.8 10*3/uL — ABNORMAL HIGH (ref 0.0–0.4)
Eos: 13 %
Hematocrit: 36.3 % (ref 34.0–46.6)
Hemoglobin: 12.1 g/dL (ref 11.1–15.9)
Immature Grans (Abs): 0 10*3/uL (ref 0.0–0.1)
Immature Granulocytes: 1 %
Lymphocytes Absolute: 1.1 10*3/uL (ref 0.7–3.1)
Lymphs: 18 %
MCH: 32.3 pg (ref 26.6–33.0)
MCHC: 33.3 g/dL (ref 31.5–35.7)
MCV: 97 fL (ref 79–97)
Monocytes Absolute: 0.4 10*3/uL (ref 0.1–0.9)
Monocytes: 7 %
Neutrophils Absolute: 3.9 10*3/uL (ref 1.4–7.0)
Neutrophils: 60 %
RBC: 3.75 x10E6/uL — ABNORMAL LOW (ref 3.77–5.28)
RDW: 13.8 % (ref 11.7–15.4)
WBC: 6.3 10*3/uL (ref 3.4–10.8)

## 2022-05-22 LAB — CMP14+EGFR
ALT: 16 IU/L (ref 0–32)
AST: 18 IU/L (ref 0–40)
Albumin/Globulin Ratio: 1.4 (ref 1.2–2.2)
Albumin: 3.5 g/dL — ABNORMAL LOW (ref 3.7–4.7)
Alkaline Phosphatase: 125 IU/L — ABNORMAL HIGH (ref 44–121)
BUN/Creatinine Ratio: 13 (ref 12–28)
BUN: 17 mg/dL (ref 8–27)
Bilirubin Total: <0.2 mg/dL (ref 0.0–1.2)
CO2: 23 mmol/L (ref 20–29)
Calcium: 8.9 mg/dL (ref 8.7–10.3)
Chloride: 107 mmol/L — ABNORMAL HIGH (ref 96–106)
Creatinine, Ser: 1.34 mg/dL — ABNORMAL HIGH (ref 0.57–1.00)
Globulin, Total: 2.5 g/dL (ref 1.5–4.5)
Glucose: 98 mg/dL (ref 70–99)
Potassium: 4.1 mmol/L (ref 3.5–5.2)
Sodium: 146 mmol/L — ABNORMAL HIGH (ref 134–144)
Total Protein: 6 g/dL (ref 6.0–8.5)
eGFR: 39 mL/min/{1.73_m2} — ABNORMAL LOW (ref >59)

## 2022-05-22 LAB — ALPHA-GAL PANEL
Allergen Lamb IgE: 0.21 kU/L — AB
Beef IgE: 0.24 kU/L — AB
IgE (Immunoglobulin E), Serum: 7843 IU/mL — ABNORMAL HIGH (ref 6–495)
O215-IgE Alpha-Gal: 0.19 kU/L — AB
Pork IgE: 0.23 kU/L — AB

## 2022-05-22 LAB — SEDIMENTATION RATE: Sed Rate: 4 mm/hr (ref 0–40)

## 2022-05-22 LAB — THYROID ANTIBODIES
Thyroglobulin Antibody: <1 IU/mL (ref 0.0–0.9)
Thyroperoxidase Ab SerPl-aCnc: 9 IU/mL (ref 0–34)

## 2022-05-22 LAB — PROTEIN ELECTROPHORESIS, SERUM, WITH REFLEX
A/G Ratio: 1.4 (ref 0.7–1.7)
Albumin ELP: 3.5 g/dL (ref 2.9–4.4)
Alpha 1: 0.3 g/dL (ref 0.0–0.4)
Alpha 2: 0.6 g/dL (ref 0.4–1.0)
Beta: 0.8 g/dL (ref 0.7–1.3)
Gamma Globulin: 0.8 g/dL (ref 0.4–1.8)
Globulin, Total: 2.5 g/dL (ref 2.2–3.9)

## 2022-05-22 LAB — ANTINUCLEAR ANTIBODIES, IFA: ANA Titer 1: NEGATIVE

## 2022-05-22 LAB — CHRONIC URTICARIA: cu index: 47.2 — ABNORMAL HIGH (ref <10)

## 2022-05-22 LAB — TRYPTASE: Tryptase: 11 ug/L (ref 2.2–13.2)

## 2022-05-22 LAB — C-REACTIVE PROTEIN: CRP: 14 mg/L — ABNORMAL HIGH (ref 0–10)

## 2022-05-22 MED ORDER — OMALIZUMAB 150 MG/ML ~~LOC~~ SOSY
300.0000 mg | PREFILLED_SYRINGE | SUBCUTANEOUS | Status: AC
  Administered 2022-05-22 – 2022-10-30 (×6): 300 mg via SUBCUTANEOUS

## 2022-05-22 NOTE — Progress Notes (Signed)
Immunotherapy   Patient Details  Name: Susan Bennett MRN: [id number] Date of Birth: 27-Mar-1941  05/22/2022  [name] here to start Xolair for hives. Patient received 300 mg dose and waited 30 minutes in office with no problems.   Frequency: every 28 days Epi-Pen:Epi-Pen Available  Consent signed and patient instructions given.   Dub Mikes 05/22/2022, 2:07 PM

## 2022-05-29 ENCOUNTER — Telehealth: Payer: Self-pay | Admitting: Allergy & Immunology

## 2022-05-29 NOTE — Telephone Encounter (Signed)
Patient daughter Susan Bennett called and would like for a nurse to call her about her mother still itching and feet swelling since she got her xolair. [phone]

## 2022-05-30 NOTE — Telephone Encounter (Signed)
Called and spoke with the patients daughter and she stated that she is miserable and having a lot of swelling in her feet and is having a lot of itching and broken out in hives. I have her scheduled to come in and see [place] tomorrow in the [place] office.

## 2022-05-31 ENCOUNTER — Emergency Department (HOSPITAL_COMMUNITY)
Admission: EM | Admit: 2022-05-31 | Discharge: 2022-06-01 | Disposition: A | Discharge status: Home / Self Care | Payer: Medicare Other | Attending: Emergency Medicine | Admitting: Emergency Medicine

## 2022-05-31 ENCOUNTER — Emergency Department (HOSPITAL_COMMUNITY): Payer: Medicare Other

## 2022-05-31 ENCOUNTER — Other Ambulatory Visit: Payer: Self-pay

## 2022-05-31 ENCOUNTER — Ambulatory Visit (INDEPENDENT_AMBULATORY_CARE_PROVIDER_SITE_OTHER): Payer: Medicare Other | Admitting: Allergy & Immunology

## 2022-05-31 ENCOUNTER — Ambulatory Visit: Payer: Medicare Other | Admitting: Nurse Practitioner

## 2022-05-31 ENCOUNTER — Encounter: Payer: Self-pay | Admitting: Allergy & Immunology

## 2022-05-31 ENCOUNTER — Encounter (HOSPITAL_COMMUNITY): Payer: Self-pay

## 2022-05-31 VITALS — BP 132/86 | HR 64 | Temp 97.2°F | Resp 16

## 2022-05-31 DIAGNOSIS — R350 Frequency of micturition: Secondary | ICD-10-CM | POA: Diagnosis not present

## 2022-05-31 DIAGNOSIS — I10 Essential (primary) hypertension: Secondary | ICD-10-CM | POA: Diagnosis not present

## 2022-05-31 DIAGNOSIS — R21 Rash and other nonspecific skin eruption: Secondary | ICD-10-CM | POA: Diagnosis not present

## 2022-05-31 DIAGNOSIS — R7989 Other specified abnormal findings of blood chemistry: Secondary | ICD-10-CM | POA: Insufficient documentation

## 2022-05-31 DIAGNOSIS — N3000 Acute cystitis without hematuria: Secondary | ICD-10-CM

## 2022-05-31 DIAGNOSIS — R600 Localized edema: Secondary | ICD-10-CM

## 2022-05-31 DIAGNOSIS — R531 Weakness: Secondary | ICD-10-CM

## 2022-05-31 DIAGNOSIS — L299 Pruritus, unspecified: Secondary | ICD-10-CM

## 2022-05-31 DIAGNOSIS — L508 Other urticaria: Secondary | ICD-10-CM

## 2022-05-31 DIAGNOSIS — J439 Emphysema, unspecified: Secondary | ICD-10-CM | POA: Diagnosis not present

## 2022-05-31 LAB — COMPREHENSIVE METABOLIC PANEL
ALT: 17 U/L (ref 0–44)
AST: 18 U/L (ref 15–41)
Albumin: 3.1 g/dL — ABNORMAL LOW (ref 3.5–5.0)
Alkaline Phosphatase: 113 U/L (ref 38–126)
Anion gap: 4 — ABNORMAL LOW (ref 5–15)
BUN: 27 mg/dL — ABNORMAL HIGH (ref 8–23)
CO2: 27 mmol/L (ref 22–32)
Calcium: 8.5 mg/dL — ABNORMAL LOW (ref 8.9–10.3)
Chloride: 116 mmol/L — ABNORMAL HIGH (ref 98–111)
Creatinine, Ser: 1.8 mg/dL — ABNORMAL HIGH (ref 0.44–1.00)
GFR, Estimated: 28 mL/min — ABNORMAL LOW (ref >60)
Glucose, Bld: 167 mg/dL — ABNORMAL HIGH (ref 70–99)
Potassium: 4 mmol/L (ref 3.5–5.1)
Sodium: 147 mmol/L — ABNORMAL HIGH (ref 135–145)
Total Bilirubin: 0.6 mg/dL (ref 0.3–1.2)
Total Protein: 6.1 g/dL — ABNORMAL LOW (ref 6.5–8.1)

## 2022-05-31 LAB — URINALYSIS, ROUTINE W REFLEX MICROSCOPIC
Bilirubin Urine: NEGATIVE
Glucose, UA: NEGATIVE mg/dL
Hgb urine dipstick: NEGATIVE
Ketones, ur: NEGATIVE mg/dL
Nitrite: NEGATIVE
Protein, ur: 30 mg/dL — AB
Specific Gravity, Urine: 1.023 (ref 1.005–1.030)
pH: 5 (ref 5.0–8.0)

## 2022-05-31 LAB — BRAIN NATRIURETIC PEPTIDE: B Natriuretic Peptide: 176 pg/mL — ABNORMAL HIGH (ref 0.0–100.0)

## 2022-05-31 LAB — CBC WITH DIFFERENTIAL/PLATELET
Abs Immature Granulocytes: 0.07 10*3/uL (ref 0.00–0.07)
Basophils Absolute: 0.1 10*3/uL (ref 0.0–0.1)
Basophils Relative: 1 %
Eosinophils Absolute: 2.9 10*3/uL — ABNORMAL HIGH (ref 0.0–0.5)
Eosinophils Relative: 27 %
HCT: 38.1 % (ref 36.0–46.0)
Hemoglobin: 12 g/dL (ref 12.0–15.0)
Immature Granulocytes: 1 %
Lymphocytes Relative: 17 %
Lymphs Abs: 1.8 10*3/uL (ref 0.7–4.0)
MCH: 32.3 pg (ref 26.0–34.0)
MCHC: 31.5 g/dL (ref 30.0–36.0)
MCV: 102.7 fL — ABNORMAL HIGH (ref 80.0–100.0)
Monocytes Absolute: 0.7 10*3/uL (ref 0.1–1.0)
Monocytes Relative: 6 %
Neutro Abs: 5.3 10*3/uL (ref 1.7–7.7)
Neutrophils Relative %: 48 %
Platelets: 194 10*3/uL (ref 150–400)
RBC: 3.71 MIL/uL — ABNORMAL LOW (ref 3.87–5.11)
RDW: 15.3 % (ref 11.5–15.5)
WBC: 10.8 10*3/uL — ABNORMAL HIGH (ref 4.0–10.5)
nRBC: 0 % (ref 0.0–0.2)

## 2022-05-31 MED ORDER — CEPHALEXIN 500 MG PO CAPS
500.0000 mg | ORAL_CAPSULE | Freq: Once | ORAL | Status: AC
  Administered 2022-06-01: 500 mg via ORAL
  Filled 2022-05-31: qty 1

## 2022-05-31 MED ORDER — MECLIZINE HCL 12.5 MG PO TABS
25.0000 mg | ORAL_TABLET | Freq: Once | ORAL | Status: AC
  Administered 2022-06-01: 25 mg via ORAL
  Filled 2022-05-31: qty 2

## 2022-05-31 MED ORDER — HYDROXYZINE HCL 25 MG PO TABS
25.0000 mg | ORAL_TABLET | Freq: Once | ORAL | Status: AC
  Administered 2022-06-01: 25 mg via ORAL
  Filled 2022-05-31: qty 1

## 2022-05-31 NOTE — ED Provider Notes (Signed)
[facility] EMERGENCY DEPARTMENT Provider Note   CSN: [id number] Arrival date & time: 05/31/22  1247     History  Chief Complaint  Patient presents with   Leg Swelling   Urinary Frequency    Susan Bennett is a 81 y.o. female.  Patient is an 81 year old female with past medical history of hypertension, depression, dementia.  Patient presenting today for evaluation of bilateral foot swelling, rash, itching, increased urination, and feeling generally unwell.  These problems have been ongoing for the past 3 weeks.  She is seen a dermatologist who gave her some sort of injection and prescribed creams and medications, but these have not helped.  Patient was seen by an allergist today, then referred here due to the multiple issues she was complaining of.  The history is provided by the patient and a relative.       Home Medications Prior to Admission medications   Medication Sig Start Date End Date Taking? Authorizing Provider  benazepril (LOTENSIN) 40 MG tablet Take 1 tablet (40 mg total) by mouth daily. 10/18/21   Raliegh Ip, DO  diclofenac (VOLTAREN) 50 MG EC tablet TAKE 1 TABLET BY MOUTH TWICE DAILY AS NEEDED FOR PAIN (ONLY USE IF TYLENOL NOT WORKING) 02/12/22   Delynn Flavin M, DO  DM-APAP-CPM (CORICIDIN HBP) 10-325-2 MG TABS Take 1 tablet by mouth every 6 (six) hours as needed (Every 6-8 hours as needed). 08/02/21   Sonny Masters, FNP  doxepin (SINEQUAN) 10 MG capsule Take 1 capsule (10 mg total) by mouth at bedtime. 05/15/22 06/14/22  Alfonse Spruce, MD  EPINEPHrine 0.3 mg/0.3 mL IJ SOAJ injection Inject 0.3 mg into the muscle as needed for anaphylaxis. 05/09/22   Sonny Masters, FNP  famotidine (PEPCID) 20 MG tablet Take 1 tablet (20 mg total) by mouth 2 (two) times daily. 05/15/22 06/14/22  Alfonse Spruce, MD  fexofenadine [facility] ALLERGY) 180 MG tablet Take 1 tablet (180 mg total) by mouth in the morning and at bedtime. 05/15/22 06/14/22  Alfonse Spruce, MD  lidocaine (LIDODERM) 5 % Place 1 patch onto the skin daily. Remove & Discard patch within 12 hours or as directed by MD 08/08/21   Raliegh Ip, DO  memantine (NAMENDA) 10 MG tablet Take 1 tablet (10 mg total) by mouth 2 (two) times daily. 04/03/22   Sonny Masters, FNP  montelukast (SINGULAIR) 10 MG tablet Take 1 tablet (10 mg total) by mouth at bedtime. 05/16/22   Alfonse Spruce, MD  sertraline (ZOLOFT) 50 MG tablet Take 1.5 tablets (75 mg total) by mouth daily. 10/03/21   Raliegh Ip, DO  triamcinolone ointment (KENALOG) 0.1 % Apply 1 Application topically 2 (two) times daily. 05/17/22   Alfonse Spruce, MD      Allergies    Patient has no known allergies.    Review of Systems   Review of Systems  All other systems reviewed and are negative.   Physical Exam Updated Vital Signs BP (!) 188/76 (BP Location: Right Arm)   Pulse 83   Temp 97.9 F (36.6 C) (Oral)   Resp 14   Ht 5\' 3"  (1.6 m)   Wt 48.4 kg   SpO2 100%   BMI 18.92 kg/m  Physical Exam Vitals and nursing note reviewed.  Constitutional:      General: She is not in acute distress.    Appearance: She is well-developed. She is not diaphoretic.  HENT:     Head: Normocephalic  and atraumatic.  Cardiovascular:     Rate and Rhythm: Normal rate and regular rhythm.     Heart sounds: No murmur heard.    No friction rub. No gallop.  Pulmonary:     Effort: Pulmonary effort is normal. No respiratory distress.     Breath sounds: Normal breath sounds. No wheezing.  Abdominal:     General: Bowel sounds are normal. There is no distension.     Palpations: Abdomen is soft.     Tenderness: There is no abdominal tenderness.  Musculoskeletal:        General: Normal range of motion.     Cervical back: Normal range of motion and neck supple.     Right lower leg: Edema present.     Left lower leg: Edema present.     Comments: There is edema of both feet.  DP pulses are palpable.  Skin:    General:  Skin is warm and dry.     Comments: Patient has very dry and scaly skin that is flaking.  Neurological:     General: No focal deficit present.     Mental Status: She is alert and oriented to person, place, and time.     ED Results / Procedures / Treatments   Labs (all labs ordered are listed, but only abnormal results are displayed) Labs Reviewed  URINALYSIS, ROUTINE W REFLEX MICROSCOPIC - Abnormal; Notable for the following components:      Result Value   APPearance HAZY (*)    Protein, ur 30 (*)    Leukocytes,Ua SMALL (*)    Bacteria, UA RARE (*)    All other components within normal limits  BRAIN NATRIURETIC PEPTIDE - Abnormal; Notable for the following components:   B Natriuretic Peptide 176.0 (*)    All other components within normal limits  COMPREHENSIVE METABOLIC PANEL - Abnormal; Notable for the following components:   Sodium 147 (*)    Chloride 116 (*)    Glucose, Bld 167 (*)    BUN 27 (*)    Creatinine, Ser 1.80 (*)    Calcium 8.5 (*)    Total Protein 6.1 (*)    Albumin 3.1 (*)    GFR, Estimated 28 (*)    Anion gap 4 (*)    All other components within normal limits  CBC WITH DIFFERENTIAL/PLATELET - Abnormal; Notable for the following components:   WBC 10.8 (*)    RBC 3.71 (*)    MCV 102.7 (*)    Eosinophils Absolute 2.9 (*)    All other components within normal limits    EKG None  Radiology DG Chest 2 View  Result Date: 05/31/2022 CLINICAL DATA:  Lower extremity edema. EXAM: CHEST - 2 VIEW COMPARISON:  None Available. FINDINGS: Cardiac silhouette is at the upper limits of normal size. Mediastinal contours are within normal limits. Mild-to-moderate calcification within the aortic arch. Mild flattening of the diaphragms with high-grade hyperinflation and expansion of the AP dimension of the chest. Increased lucencies within the upper lungs with attenuation of the pulmonary vasculature compatible with chronic emphysematous changes. Mild biapical pleural  thickening/scarring. No focal airspace opacity. No pulmonary edema, pleural effusion, or pneumothorax. Moderate multilevel degenerative disc changes of the mid to upper thoracic spine. IMPRESSION: 1. No acute cardiopulmonary process. 2. Chronic emphysematous changes of the lungs with high-grade hyperinflation. Electronically Signed   By: Neita Garnet M.D.   On: 05/31/2022 15:56    Procedures Procedures    Medications Ordered in ED Medications  meclizine (ANTIVERT) tablet 25 mg (has no administration in time range)  cephALEXin (KEFLEX) capsule 500 mg (has no administration in time range)    ED Course/ Medical Decision Making/ A&P  Patient presenting here with multiple complaints as described in the HPI.  She is having dry, itchy skin and pruritus for several months.  The allergist and dermatologist has been unable to help her.  Patient has extremely dry skin for which I will recommend frequent soaking and application of Cetaphil cream.  She also has edema of both feet.  I will prescribe a short course of Lasix, have her elevate her feet, and see if this helps.  Her BNP is mildly elevated, but patient has no other signs or symptoms of heart failure.  Patient also complaining of frequent urination.  Urinalysis is slightly suggestive of a UTI.  She will be given Keflex for this.  None of the patient's problems seem emergent and I feel as though she can safely be discharged with the above treatment and outpatient follow-up with primary doctor.  Final Clinical Impression(s) / ED Diagnoses Final diagnoses:  None    Rx / DC Orders ED Discharge Orders     None         Geoffery Lyons, MD 05/31/22 2357

## 2022-05-31 NOTE — Telephone Encounter (Signed)
Anne and I saw her and sent her to the ED.   Malachi Bonds, MD [facility]

## 2022-05-31 NOTE — ED Provider Triage Note (Cosign Needed)
Emergency Medicine Provider Triage Evaluation Note  Susan Bennett , a 81 y.o. female  was evaluated in triage.  Pt complains of urinary frequency and bilateral lower extremity edema.  Urinary frequency has increased last month.  Patient is having trouble making to the bathroom and wetting her clothes at times.  Denies back trauma.  Denies saddle paresthesia.  Denies painful urination and malodorous urine.  Lower extremity edema started about 2 weeks ago.  Left greater than right.  Denies chest pain shortness of breath.  Denies calf tenderness.  Was being seen today by her allergist for a rash on her skin.  Advised her to be evaluated in the ED for the symptoms.  Review of Systems  Positive: See above Negative: See above  Physical Exam  BP (!) 188/76 (BP Location: Right Arm)   Pulse 83   Temp 97.9 F (36.6 C) (Oral)   Resp 14   Ht 5\' 3"  (1.6 m)   Wt 48.4 kg   SpO2 100%   BMI 18.92 kg/m  Gen:   Awake, no distress   Resp:  Normal effort  MSK:   Moves extremities without difficulty  Other:    Medical Decision Making  Medically screening exam initiated at 3:23 PM.  Appropriate orders placed.  was informed that the remainder of the evaluation will be completed by another provider, this initial triage assessment does not replace that evaluation, and the importance of remaining in the ED until their evaluation is complete.  Work up started   [name], PA-C 05/31/22 1526

## 2022-05-31 NOTE — Discharge Instructions (Signed)
Begin taking Lasix as prescribed.  Begin taking Keflex as prescribed.  Begin taking hydroxyzine as prescribed as needed for itching.  Follow-up with your primary doctor if symptoms or not improving in the next few days.

## 2022-05-31 NOTE — Progress Notes (Signed)
[address] [place] [address] Dept: [phone]  FOLLOW UP NOTE  Patient ID: Susan Bennett, female    DOB: [birth year]  Age: 81 y.o. MRN: [id number] Date of Office Visit: 05/31/2022  Assessment  Chief Complaint: Angioedema, Urticaria, and Pruritus  HPI Susan Bennett is an 81 year old female who presents to the clinic for a follow up visit. She was last seen in this clinic on 05/15/2022 by Dr. Dellis Anes for evaluation of chronic urticaria. She received her first Xolair injection on 05/22/22. She is accompanied by her caregiver who assists with history. At today's visit, she reports that Susan Bennett continues to experience redness and itch with no relief of symptoms since her last visit to this clinic. Her caregiver reports that she stopped giving Allegra due to excessive sleepiness. She continued famotidine 20 mg once a day and montelukast 10 mg once a day with no relief of symptoms. The caregiver reports she is using a variety of over the counter and medicated topical therapies without relief of symptoms. She reports an assortment of other symptoms that began 2 weeks ago including bilateral lower extremity swelling, loss of appetite, and increase in urinary frequency and increase in urinary incontinence. She denies recent fever, sweats, chills, or sick contacts. She denies cardiopulmonary symptoms.   She is still eating animal proteins. She has not changed her diet at all.   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  She did have an elevated creatinine which seem to be her baseline.  She had elevated eosinophils on her CBC.  Did not go panel was positive, but they were fairly low.  IgE was elevated. SPEP was within normal limits. ANA and tryptase were negative.   Drug Allergies:  No Known Allergies  Physical Exam: BP 132/86 (BP Location: Left Arm, Patient Position: Sitting, Cuff Size: Normal)   Pulse 64   Temp (!) 97.2 F (36.2 C) (Temporal)    Resp 16   SpO2 99%    Physical Exam Vitals reviewed.  Constitutional:      Appearance: Normal appearance.  HENT:     Head: Normocephalic and atraumatic.     Right Ear: Tympanic membrane normal.     Left Ear: Tympanic membrane normal.     Nose:     Comments: Bilateral nares normal. Ears normal. Pharynx normal. Eyes normal    Mouth/Throat:     Pharynx: Oropharynx is clear.  Eyes:     Conjunctiva/sclera: Conjunctivae normal.  Cardiovascular:     Rate and Rhythm: Normal rate and regular rhythm.     Heart sounds: Normal heart sounds. No murmur heard. Pulmonary:     Effort: Pulmonary effort is normal.     Breath sounds: Normal breath sounds.     Comments: Lungs clear to auscultation Musculoskeletal:     Cervical back: Normal range of motion and neck supple.     Comments: Bilateral lower extremity swelling with 3+ pitting edema. Unsteady gait.   Skin:    Comments: Dry thin, excoriated appearing skin on bilateral arms, legs, torso, and back.   Neurological:     Mental Status: She is alert and oriented to person, place, and time.  Psychiatric:        Mood and Affect: Mood normal.        Behavior: Behavior normal.        Thought Content: Thought content normal.        Judgment: Judgment normal.  Assessment and Plan:  1. Chronic urticaria   2. Bilateral lower extremity edema   3. Weakness    Chronic urticaria Begin Allegra 90 mg once or twice a day for itch Begin famotidine 20 mg once  a day for itch Continue montelukast 10 mg once a day for itch Continue to receive Xolair injections once a month for itch  Bilateral lower extremity swelling/decreased appetite/confusion Go to the ED for further evaluation Her current status is beyond our management capabilities.   Call the clinic if this treatment plan is not working well for you  Follow up in 2 weeks or sooner if needed.     '

## 2022-05-31 NOTE — Patient Instructions (Signed)
Chronic urticaria Begin Allegra 90 mg once or twice a day for itch Begin famotidine 20 mg once  a day for itch Continue montelukast 10 mg once a day for itch Continue to receive Xolair injections once a month for itch  Bilateral lower extremity swelling/decreased appetite/confusion Go to the ED for further evaluation  Call the clinic if this treatment plan is not working well for you  Follow up in 2 weeks or sooner if needed.

## 2022-05-31 NOTE — ED Triage Notes (Signed)
Pt has had swelling to her feet and legs x 2 weeks, saw PCP and was told venous insufficiency but allergist told her to come get checked in the ER for it at the allergy visit today.  Pt also reports no appetite and frequent urination but denies any pain with urination or foul smelling urine.

## 2022-06-01 DIAGNOSIS — D7589 Other specified diseases of blood and blood-forming organs: Secondary | ICD-10-CM | POA: Diagnosis not present

## 2022-06-01 DIAGNOSIS — G9341 Metabolic encephalopathy: Secondary | ICD-10-CM | POA: Diagnosis present

## 2022-06-01 DIAGNOSIS — L509 Urticaria, unspecified: Secondary | ICD-10-CM | POA: Diagnosis not present

## 2022-06-01 DIAGNOSIS — R1310 Dysphagia, unspecified: Secondary | ICD-10-CM | POA: Diagnosis not present

## 2022-06-01 DIAGNOSIS — R296 Repeated falls: Secondary | ICD-10-CM | POA: Diagnosis present

## 2022-06-01 DIAGNOSIS — W19XXXD Unspecified fall, subsequent encounter: Secondary | ICD-10-CM | POA: Diagnosis not present

## 2022-06-01 DIAGNOSIS — F0390 Unspecified dementia without behavioral disturbance: Secondary | ICD-10-CM | POA: Diagnosis present

## 2022-06-01 DIAGNOSIS — R609 Edema, unspecified: Secondary | ICD-10-CM | POA: Diagnosis not present

## 2022-06-01 DIAGNOSIS — R262 Difficulty in walking, not elsewhere classified: Secondary | ICD-10-CM | POA: Diagnosis not present

## 2022-06-01 DIAGNOSIS — E43 Unspecified severe protein-calorie malnutrition: Secondary | ICD-10-CM | POA: Diagnosis not present

## 2022-06-01 DIAGNOSIS — I6381 Other cerebral infarction due to occlusion or stenosis of small artery: Secondary | ICD-10-CM | POA: Diagnosis not present

## 2022-06-01 DIAGNOSIS — T17900D Unspecified foreign body in respiratory tract, part unspecified causing asphyxiation, subsequent encounter: Secondary | ICD-10-CM | POA: Diagnosis not present

## 2022-06-01 DIAGNOSIS — R531 Weakness: Secondary | ICD-10-CM | POA: Diagnosis not present

## 2022-06-01 DIAGNOSIS — E440 Moderate protein-calorie malnutrition: Secondary | ICD-10-CM | POA: Diagnosis present

## 2022-06-01 DIAGNOSIS — N189 Chronic kidney disease, unspecified: Secondary | ICD-10-CM | POA: Diagnosis not present

## 2022-06-01 DIAGNOSIS — E860 Dehydration: Secondary | ICD-10-CM | POA: Diagnosis present

## 2022-06-01 DIAGNOSIS — E870 Hyperosmolality and hypernatremia: Secondary | ICD-10-CM | POA: Diagnosis present

## 2022-06-01 DIAGNOSIS — R7989 Other specified abnormal findings of blood chemistry: Secondary | ICD-10-CM | POA: Diagnosis not present

## 2022-06-01 DIAGNOSIS — R21 Rash and other nonspecific skin eruption: Secondary | ICD-10-CM | POA: Diagnosis not present

## 2022-06-01 DIAGNOSIS — N179 Acute kidney failure, unspecified: Secondary | ICD-10-CM | POA: Diagnosis present

## 2022-06-01 DIAGNOSIS — R4182 Altered mental status, unspecified: Secondary | ICD-10-CM | POA: Diagnosis not present

## 2022-06-01 DIAGNOSIS — R350 Frequency of micturition: Secondary | ICD-10-CM | POA: Diagnosis not present

## 2022-06-01 DIAGNOSIS — W19XXXA Unspecified fall, initial encounter: Secondary | ICD-10-CM | POA: Diagnosis not present

## 2022-06-01 DIAGNOSIS — Z681 Body mass index (BMI) 19 or less, adult: Secondary | ICD-10-CM | POA: Diagnosis not present

## 2022-06-01 DIAGNOSIS — Z79899 Other long term (current) drug therapy: Secondary | ICD-10-CM | POA: Diagnosis not present

## 2022-06-01 DIAGNOSIS — R600 Localized edema: Secondary | ICD-10-CM | POA: Diagnosis not present

## 2022-06-01 DIAGNOSIS — R627 Adult failure to thrive: Secondary | ICD-10-CM | POA: Diagnosis not present

## 2022-06-01 DIAGNOSIS — L299 Pruritus, unspecified: Secondary | ICD-10-CM | POA: Diagnosis present

## 2022-06-01 DIAGNOSIS — R638 Other symptoms and signs concerning food and fluid intake: Secondary | ICD-10-CM | POA: Diagnosis not present

## 2022-06-01 DIAGNOSIS — R918 Other nonspecific abnormal finding of lung field: Secondary | ICD-10-CM | POA: Diagnosis not present

## 2022-06-01 DIAGNOSIS — R410 Disorientation, unspecified: Secondary | ICD-10-CM | POA: Diagnosis not present

## 2022-06-01 DIAGNOSIS — R0902 Hypoxemia: Secondary | ICD-10-CM | POA: Diagnosis not present

## 2022-06-01 DIAGNOSIS — I10 Essential (primary) hypertension: Secondary | ICD-10-CM | POA: Diagnosis not present

## 2022-06-01 MED ORDER — FUROSEMIDE 20 MG PO TABS: 20.0000 mg | ORAL_TABLET | Freq: Every day | ORAL | 0 refills | Status: DC

## 2022-06-01 MED ORDER — HYDROXYZINE HCL 25 MG PO TABS: 25.0000 mg | ORAL_TABLET | Freq: Four times a day (QID) | ORAL | 0 refills | Status: DC

## 2022-06-01 MED ORDER — CEPHALEXIN 500 MG PO CAPS: 500.0000 mg | ORAL_CAPSULE | Freq: Three times a day (TID) | ORAL | 0 refills | Status: DC

## 2022-06-04 ENCOUNTER — Telehealth: Payer: Self-pay | Admitting: Family Medicine

## 2022-06-04 DIAGNOSIS — R296 Repeated falls: Secondary | ICD-10-CM | POA: Diagnosis present

## 2022-06-04 DIAGNOSIS — T17900D Unspecified foreign body in respiratory tract, part unspecified causing asphyxiation, subsequent encounter: Secondary | ICD-10-CM | POA: Diagnosis not present

## 2022-06-04 DIAGNOSIS — R918 Other nonspecific abnormal finding of lung field: Secondary | ICD-10-CM | POA: Diagnosis not present

## 2022-06-04 DIAGNOSIS — E870 Hyperosmolality and hypernatremia: Secondary | ICD-10-CM | POA: Diagnosis present

## 2022-06-04 DIAGNOSIS — I10 Essential (primary) hypertension: Secondary | ICD-10-CM | POA: Diagnosis present

## 2022-06-04 DIAGNOSIS — W19XXXA Unspecified fall, initial encounter: Secondary | ICD-10-CM | POA: Diagnosis not present

## 2022-06-04 DIAGNOSIS — L299 Pruritus, unspecified: Secondary | ICD-10-CM | POA: Diagnosis present

## 2022-06-04 DIAGNOSIS — E860 Dehydration: Secondary | ICD-10-CM

## 2022-06-04 DIAGNOSIS — R1310 Dysphagia, unspecified: Secondary | ICD-10-CM | POA: Diagnosis not present

## 2022-06-04 DIAGNOSIS — J9811 Atelectasis: Secondary | ICD-10-CM | POA: Insufficient documentation

## 2022-06-04 DIAGNOSIS — N179 Acute kidney failure, unspecified: Secondary | ICD-10-CM | POA: Diagnosis present

## 2022-06-04 DIAGNOSIS — R627 Adult failure to thrive: Secondary | ICD-10-CM | POA: Diagnosis not present

## 2022-06-04 DIAGNOSIS — R262 Difficulty in walking, not elsewhere classified: Secondary | ICD-10-CM | POA: Diagnosis not present

## 2022-06-04 DIAGNOSIS — R531 Weakness: Secondary | ICD-10-CM | POA: Diagnosis not present

## 2022-06-04 DIAGNOSIS — G9341 Metabolic encephalopathy: Secondary | ICD-10-CM | POA: Diagnosis present

## 2022-06-04 DIAGNOSIS — R410 Disorientation, unspecified: Secondary | ICD-10-CM | POA: Diagnosis not present

## 2022-06-04 DIAGNOSIS — Z681 Body mass index (BMI) 19 or less, adult: Secondary | ICD-10-CM | POA: Diagnosis not present

## 2022-06-04 DIAGNOSIS — Z79899 Other long term (current) drug therapy: Secondary | ICD-10-CM | POA: Diagnosis not present

## 2022-06-04 DIAGNOSIS — E43 Unspecified severe protein-calorie malnutrition: Secondary | ICD-10-CM | POA: Diagnosis not present

## 2022-06-04 DIAGNOSIS — T17908A Unspecified foreign body in respiratory tract, part unspecified causing other injury, initial encounter: Secondary | ICD-10-CM

## 2022-06-04 DIAGNOSIS — D7589 Other specified diseases of blood and blood-forming organs: Secondary | ICD-10-CM | POA: Diagnosis present

## 2022-06-04 DIAGNOSIS — E440 Moderate protein-calorie malnutrition: Secondary | ICD-10-CM | POA: Diagnosis present

## 2022-06-04 DIAGNOSIS — F0390 Unspecified dementia without behavioral disturbance: Secondary | ICD-10-CM | POA: Diagnosis present

## 2022-06-04 DIAGNOSIS — W19XXXD Unspecified fall, subsequent encounter: Secondary | ICD-10-CM | POA: Diagnosis not present

## 2022-06-04 DIAGNOSIS — R638 Other symptoms and signs concerning food and fluid intake: Secondary | ICD-10-CM | POA: Diagnosis not present

## 2022-06-04 HISTORY — DX: Acute kidney failure, unspecified: N17.9

## 2022-06-04 HISTORY — DX: Dehydration: E86.0

## 2022-06-04 HISTORY — DX: Unspecified foreign body in respiratory tract, part unspecified causing other injury, initial encounter: T17.908A

## 2022-06-04 NOTE — Telephone Encounter (Signed)
I'm already double booked for tomorrow and am not in clinic 1/4.  If she can do 1/9, ok to put her in that 415 slot.

## 2022-06-04 NOTE — Telephone Encounter (Signed)
Daughter states she can not wait that long will bring her in for tomorrows appt.

## 2022-06-04 NOTE — Telephone Encounter (Signed)
Patient was seen in the ER twice over the weekend for multiple issues. Made an appt for 1/3 in the afternoon, she would prefer to see her PCP. Would like to know if she could be worked in on the afternoon of either 1/3 or 1/4 - please call back to advise.

## 2022-06-05 ENCOUNTER — Ambulatory Visit: Payer: Medicare Other | Admitting: Nurse Practitioner

## 2022-06-07 ENCOUNTER — Encounter: Payer: Self-pay | Admitting: Family Medicine

## 2022-06-10 DIAGNOSIS — E440 Moderate protein-calorie malnutrition: Secondary | ICD-10-CM

## 2022-06-10 HISTORY — DX: Moderate protein-calorie malnutrition: E44.0

## 2022-06-12 ENCOUNTER — Ambulatory Visit: Payer: Medicare Other | Admitting: Allergy & Immunology

## 2022-06-14 DIAGNOSIS — E860 Dehydration: Secondary | ICD-10-CM | POA: Diagnosis not present

## 2022-06-14 DIAGNOSIS — G9341 Metabolic encephalopathy: Secondary | ICD-10-CM | POA: Diagnosis not present

## 2022-06-14 DIAGNOSIS — E440 Moderate protein-calorie malnutrition: Secondary | ICD-10-CM | POA: Diagnosis not present

## 2022-06-14 DIAGNOSIS — F0390 Unspecified dementia without behavioral disturbance: Secondary | ICD-10-CM | POA: Diagnosis not present

## 2022-06-14 DIAGNOSIS — I10 Essential (primary) hypertension: Secondary | ICD-10-CM | POA: Diagnosis not present

## 2022-06-18 DIAGNOSIS — F0390 Unspecified dementia without behavioral disturbance: Secondary | ICD-10-CM | POA: Diagnosis not present

## 2022-06-18 DIAGNOSIS — E860 Dehydration: Secondary | ICD-10-CM | POA: Diagnosis not present

## 2022-06-18 DIAGNOSIS — R059 Cough, unspecified: Secondary | ICD-10-CM | POA: Diagnosis not present

## 2022-06-18 DIAGNOSIS — L500 Allergic urticaria: Secondary | ICD-10-CM | POA: Diagnosis not present

## 2022-06-18 DIAGNOSIS — I10 Essential (primary) hypertension: Secondary | ICD-10-CM | POA: Diagnosis not present

## 2022-06-19 ENCOUNTER — Ambulatory Visit (INDEPENDENT_AMBULATORY_CARE_PROVIDER_SITE_OTHER): Payer: Medicare Other

## 2022-06-19 ENCOUNTER — Encounter: Payer: Self-pay | Admitting: Family Medicine

## 2022-06-19 ENCOUNTER — Ambulatory Visit: Payer: Medicare Other | Admitting: Family Medicine

## 2022-06-19 VITALS — HR 73 | Temp 97.6°F | Resp 16

## 2022-06-19 DIAGNOSIS — L501 Idiopathic urticaria: Secondary | ICD-10-CM

## 2022-06-19 DIAGNOSIS — R600 Localized edema: Secondary | ICD-10-CM | POA: Diagnosis not present

## 2022-06-19 HISTORY — DX: Localized edema: R60.0

## 2022-06-19 HISTORY — DX: Idiopathic urticaria: L50.1

## 2022-06-19 MED ORDER — TRIAMCINOLONE ACETONIDE 0.1 % EX OINT: TOPICAL_OINTMENT | Freq: Two times a day (BID) | CUTANEOUS | 1 refills | Status: DC

## 2022-06-19 MED ORDER — DESONIDE 0.05 % EX OINT: 1.0000 | TOPICAL_OINTMENT | Freq: Two times a day (BID) | CUTANEOUS | 0 refills | Status: DC

## 2022-06-19 MED ORDER — TRIAMCINOLONE ACETONIDE 0.1 % EX OINT: 1.0000 | TOPICAL_OINTMENT | Freq: Two times a day (BID) | CUTANEOUS | 3 refills | Status: DC

## 2022-06-19 MED ORDER — LORATADINE 10 MG PO TABS: 10.0000 mg | ORAL_TABLET | Freq: Every day | ORAL | 1 refills | Status: DC

## 2022-06-19 NOTE — Progress Notes (Signed)
[place], [address] [place] [address] Dept: [phone]  FOLLOW UP NOTE  Patient ID: Susan Bennett, female    DOB: March 23, 1941  Age: 82 y.o. MRN: [id number] Date of Office Visit: 06/19/2022  Assessment  Chief Complaint: Rash (Has been on two injections. /After using cerave she itches more and is cold all the time. )  HPI Susan Bennett is an 82 year old female who presents to the clinic for a follow up visit. She was last seen in this clinic on 05/31/2022 by Dr. Ernst Bowler for evaluation of chronic urticaria. At that time, her caregiver reported symptoms of itch in addition to dehydration, lower extremity swelling, and inability to get warm and a recommendation was made for her to go to the ED for further evaluation. Since that visit, she has been to the ED 3 times and has recently moved into an assisted living facility. She is accompanied by her daughter and the assisted living coordinator.   At today's visit, her daughter reports that she continues to experience redness and itch over her entire body. She is currently taking hydroxyzine 10 mg three times a day and her daughter reports that she has been very sleepy since beginning this medication. She is not currently taking a second generation antihistamine. She continues CeraVe lotion three times a day and takes a shower about twice a week. She continues Xolair with no large or local reactions and received her second injection at today's visit.   She continues to experience bilateral lower extremity swelling with pitting edema. She continues lasix.  Her current medications are listed in the chart.    Drug Allergies:  No Known Allergies  Physical Exam: Pulse 73   Temp 97.6 F (36.4 C)   Resp 16   SpO2 98%    Physical Exam Vitals reviewed.  Constitutional:      Appearance: Normal appearance.  HENT:     Head: Normocephalic and atraumatic.     Right Ear: Tympanic membrane normal.     Left Ear: Tympanic membrane normal.      Nose:     Comments: Bilateral nares normal. [Phayrnx normal. Ears normal. Eyes normal.    Mouth/Throat:     Pharynx: Oropharynx is clear.  Eyes:     Conjunctiva/sclera: Conjunctivae normal.  Cardiovascular:     Rate and Rhythm: Normal rate and regular rhythm.     Heart sounds: Normal heart sounds. No murmur heard. Pulmonary:     Effort: Pulmonary effort is normal.     Breath sounds: Normal breath sounds.     Comments: Lungs clear to auscultation Musculoskeletal:     Cervical back: Normal range of motion and neck supple.     Comments: Sitting in wheelchair for exam. Able to stand with minimal assistance. Bilateral lower extremities with +3-4 pitting edema  Skin:    Comments: Erythematous with peeling on both hands. Scattered flaky areas. No open areas or drainage noted.   Neurological:     Mental Status: She is alert.     Comments: Answering questions appropriately when asked. Trying to stand up from her wheelchair several times unprompted.   Psychiatric:        Mood and Affect: Mood normal.        Behavior: Behavior normal.        Thought Content: Thought content normal.        Judgment: Judgment normal.     Assessment and Plan: 1. Idiopathic urticaria   2. Bilateral lower  extremity edema     Meds ordered this encounter  Medications   loratadine (CLARITIN) 10 MG tablet    Sig: Take 1 tablet (10 mg total) by mouth daily.    Dispense:  31 tablet    Refill:  1   desonide (DESOWEN) 0.05 % ointment    Sig: Apply 1 Application topically 2 (two) times daily.    Dispense:  15 g    Refill:  0   triamcinolone ointment (KENALOG) 0.1 %    Sig: Apply 1 Application topically 2 (two) times daily.    Dispense:  30 g    Refill:  3   triamcinolone 0.1% oint-Cerave equivalent lotion 1:1 mixture    Sig: Apply topically 2 (two) times daily.    Dispense:  454 g    Refill:  1    Patient Instructions  Urticaria Continue hydroxyzine 10 mg once a day at bedtime to help with  nighttime itch Begin Claritin 10 mg once a day to decrease itch Continue Xolair injections once a month to help with hives and itch  Atopic dermatitis Continue prednisone as previously prescribed Begin triamcinolone/cereve to red, itchy areas twice a day. Wait about 15 minutes and apply cereve For stubborn red, itchy areas on your face, begin desonide 0.05% ointment up to twice a day as needed. Do not use this medication longer than 2 weeks in a row For stubborn red, itchy areas below your face apply triamcinolone 0.1% ointment up to twice a day as needed. Do not use this medication longer than 2 weeks in a row  Call the clinic if this treatment plan is not working well for you  Follow up in 2 weeks or sooner if needed.  Return in about 2 weeks (around 07/03/2022), or if symptoms worsen or fail to improve.    Thank you for the opportunity to care for this patient.  Please do not hesitate to contact me with questions.  Gareth Morgan, FNP [facility]

## 2022-06-19 NOTE — Patient Instructions (Addendum)
Urticaria Continue hydroxyzine 10 mg once a day at bedtime to help with nighttime itch Begin Claritin 10 mg once a day to decrease itch Continue Xolair injections once a month to help with hives and itch  Atopic dermatitis Continue prednisone as previously prescribed Begin triamcinolone/cereve to red, itchy areas twice a day. Wait about 15 minutes and apply cereve For stubborn red, itchy areas on your face, begin desonide 0.05% ointment up to twice a day as needed. Do not use this medication longer than 2 weeks in a row For stubborn red, itchy areas below your face apply triamcinolone 0.1% ointment up to twice a day as needed. Do not use this medication longer than 2 weeks in a row  Call the clinic if this treatment plan is not working well for you  Follow up in 2 weeks or sooner if needed.

## 2022-06-21 DIAGNOSIS — I10 Essential (primary) hypertension: Secondary | ICD-10-CM | POA: Diagnosis not present

## 2022-06-21 DIAGNOSIS — E440 Moderate protein-calorie malnutrition: Secondary | ICD-10-CM | POA: Diagnosis not present

## 2022-06-21 DIAGNOSIS — G9341 Metabolic encephalopathy: Secondary | ICD-10-CM | POA: Diagnosis not present

## 2022-06-21 DIAGNOSIS — E860 Dehydration: Secondary | ICD-10-CM | POA: Diagnosis not present

## 2022-06-21 DIAGNOSIS — F0390 Unspecified dementia without behavioral disturbance: Secondary | ICD-10-CM | POA: Diagnosis not present

## 2022-06-25 DIAGNOSIS — E440 Moderate protein-calorie malnutrition: Secondary | ICD-10-CM | POA: Diagnosis not present

## 2022-06-25 DIAGNOSIS — I10 Essential (primary) hypertension: Secondary | ICD-10-CM | POA: Diagnosis not present

## 2022-06-25 DIAGNOSIS — E860 Dehydration: Secondary | ICD-10-CM | POA: Diagnosis not present

## 2022-06-25 DIAGNOSIS — G9341 Metabolic encephalopathy: Secondary | ICD-10-CM | POA: Diagnosis not present

## 2022-06-25 DIAGNOSIS — F0390 Unspecified dementia without behavioral disturbance: Secondary | ICD-10-CM | POA: Diagnosis not present

## 2022-06-26 ENCOUNTER — Encounter: Payer: Self-pay | Admitting: Urology

## 2022-06-26 ENCOUNTER — Ambulatory Visit (INDEPENDENT_AMBULATORY_CARE_PROVIDER_SITE_OTHER): Payer: Medicare Other

## 2022-06-26 ENCOUNTER — Ambulatory Visit (INDEPENDENT_AMBULATORY_CARE_PROVIDER_SITE_OTHER): Payer: Medicare Other | Admitting: Urology

## 2022-06-26 VITALS — BP 97/53 | HR 78

## 2022-06-26 DIAGNOSIS — E860 Dehydration: Secondary | ICD-10-CM | POA: Diagnosis not present

## 2022-06-26 DIAGNOSIS — F0390 Unspecified dementia without behavioral disturbance: Secondary | ICD-10-CM | POA: Diagnosis not present

## 2022-06-26 DIAGNOSIS — N3000 Acute cystitis without hematuria: Secondary | ICD-10-CM

## 2022-06-26 DIAGNOSIS — I10 Essential (primary) hypertension: Secondary | ICD-10-CM

## 2022-06-26 DIAGNOSIS — G9341 Metabolic encephalopathy: Secondary | ICD-10-CM | POA: Diagnosis not present

## 2022-06-26 DIAGNOSIS — E440 Moderate protein-calorie malnutrition: Secondary | ICD-10-CM | POA: Diagnosis not present

## 2022-06-26 LAB — BLADDER SCAN AMB NON-IMAGING: Scan Result: 112

## 2022-06-26 NOTE — Progress Notes (Signed)
06/26/2022 1:49 PM   Silvio Pate 22-Jul-1940 [id number]  Referring provider: Janora Norlander, DO [place],  [place] [address]  UTI   HPI: Ms Susan Bennett is a 82yo here for evaluation of UTI. She was hospitalized 1/2/024-06/13/2022 for UTI. No culture results available. UA today is normal. She denies nay significant LUTS. No dysuria or hematuria.   PMH: Past Medical History:  Diagnosis Date   Chronic diarrhea    Contact dermatitis and other eczema, due to unspecified cause    Depressive disorder, not elsewhere classified    Osteoporosis, unspecified    Other and unspecified hyperlipidemia    Other B-complex deficiencies    Unspecified essential hypertension    Unspecified gastritis and gastroduodenitis without mention of hemorrhage    Unspecified vitamin D deficiency     Surgical History: Past Surgical History:  Procedure Laterality Date   COLONOSCOPY     9 YEARS AGO    Home Medications:  Allergies as of 06/26/2022   No Known Allergies      Medication List        Accurate as of June 26, 2022  1:49 PM. If you have any questions, ask your nurse or doctor.          benazepril 40 MG tablet Commonly known as: LOTENSIN Take 1 tablet (40 mg total) by mouth daily.   cephALEXin 500 MG capsule Commonly known as: KEFLEX Take 1 capsule (500 mg total) by mouth 3 (three) times daily.   CERAVE EX Apply topically.   Coricidin HBP 10-325-2 MG Tabs Generic drug: DM-APAP-CPM Take 1 tablet by mouth every 6 (six) hours as needed (Every 6-8 hours as needed).   desonide 0.05 % ointment Commonly known as: DESOWEN Apply 1 Application topically 2 (two) times daily.   diclofenac 50 MG EC tablet Commonly known as: VOLTAREN TAKE 1 TABLET BY MOUTH TWICE DAILY AS NEEDED FOR PAIN (ONLY USE IF TYLENOL NOT WORKING)   doxepin 10 MG capsule Commonly known as: SINEQUAN Take 1 capsule (10 mg total) by mouth at bedtime.   EPINEPHrine 0.3 mg/0.3 mL Soaj  injection Commonly known as: EPI-PEN Inject 0.3 mg into the muscle as needed for anaphylaxis.   famotidine 20 MG tablet Commonly known as: PEPCID Take 1 tablet (20 mg total) by mouth 2 (two) times daily.   furosemide 20 MG tablet Commonly known as: LASIX Take 1 tablet (20 mg total) by mouth daily.   lidocaine 5 % Commonly known as: Lidoderm Place 1 patch onto the skin daily. Remove & Discard patch within 12 hours or as directed by MD   loratadine 10 MG tablet Commonly known as: CLARITIN Take 1 tablet (10 mg total) by mouth daily.   memantine 10 MG tablet Commonly known as: NAMENDA Take 1 tablet (10 mg total) by mouth 2 (two) times daily.   mirtazapine 15 MG tablet Commonly known as: REMERON Take by mouth.   montelukast 10 MG tablet Commonly known as: SINGULAIR Take 1 tablet (10 mg total) by mouth at bedtime.   PREDNISONE PO Take by mouth. LAST DAY WILL BE 06/23/2022   sertraline 50 MG tablet Commonly known as: Zoloft Take 1.5 tablets (75 mg total) by mouth daily.   triamcinolone 0.1% oint-Cerave equivalent lotion 1:1 mixture Apply topically 2 (two) times daily.   triamcinolone ointment 0.1 % Commonly known as: KENALOG Apply 1 Application topically 2 (two) times daily.   triamcinolone ointment 0.1 % Commonly known as: KENALOG Apply 1 Application topically  2 (two) times daily.        Allergies: No Known Allergies  Family History: Family History  Problem Relation Age of Onset   Healthy Sister    Healthy Brother    Throat cancer Brother    Stroke Brother    Heart disease Brother    Throat cancer Maternal Aunt     Social History:  reports that she has been smoking cigarettes. She has been smoking an average of .25 packs per day. She has been exposed to tobacco smoke. She has never used smokeless tobacco. She reports that she does not drink alcohol and does not use drugs.  ROS: All other review of systems were reviewed and are negative except what is  noted above in HPI  Physical Exam: BP (!) 97/53   Pulse 78   Constitutional:  Alert and oriented, No acute distress. HEENT: [place] AT, moist mucus membranes.  Trachea midline, no masses. Cardiovascular: No clubbing, cyanosis, or edema. Respiratory: Normal respiratory effort, no increased work of breathing. GI: Abdomen is soft, nontender, nondistended, no abdominal masses GU: No CVA tenderness.  Lymph: No cervical or inguinal lymphadenopathy. Skin: No rashes, bruises or suspicious lesions. Neurologic: Grossly intact, no focal deficits, moving all 4 extremities. Psychiatric: Normal mood and affect.  Laboratory Data: Lab Results  Component Value Date   WBC 10.8 (H) 05/31/2022   HGB 12.0 05/31/2022   HCT 38.1 05/31/2022   MCV 102.7 (H) 05/31/2022   PLT 194 05/31/2022    Lab Results  Component Value Date   CREATININE 1.80 (H) 05/31/2022    No results found for: "PSA"  No results found for: "TESTOSTERONE"  No results found for: "HGBA1C"  Urinalysis    Component Value Date/Time   COLORURINE YELLOW 05/31/2022 1722   APPEARANCEUR HAZY (A) 05/31/2022 1722   APPEARANCEUR Cloudy (A) 04/03/2022 1527   LABSPEC 1.023 05/31/2022 1722   PHURINE 5.0 05/31/2022 1722   GLUCOSEU NEGATIVE 05/31/2022 1722   HGBUR NEGATIVE 05/31/2022 1722   BILIRUBINUR NEGATIVE 05/31/2022 1722   BILIRUBINUR Negative 04/03/2022 1527   KETONESUR NEGATIVE 05/31/2022 1722   PROTEINUR 30 (A) 05/31/2022 1722   NITRITE NEGATIVE 05/31/2022 1722   LEUKOCYTESUR SMALL (A) 05/31/2022 1722    Lab Results  Component Value Date   LABMICR See below: 02/28/2022   WBCUA 0-5 02/28/2022   RBCUA 0-2 06/23/2017   LABEPIT 0-10 02/28/2022   MUCUS Present (A) 02/28/2022   BACTERIA RARE (A) 05/31/2022    Pertinent Imaging:  No results found for this or any previous visit.  No results found for this or any previous visit.  No results found for this or any previous visit.  No results found for this or any previous  visit.  No results found for this or any previous visit.  No valid procedures specified. No results found for this or any previous visit.  No results found for this or any previous visit.   Assessment & Plan:    1. Acute cystitis without hematuria -We discussed the natural hx of UTIs and the various causes. We discussed the treatment options including post coital prophylaxis, daily prophylaxis, topical estrogen therapy. Since she has had only 1 UTI recenetly we have elected to proceed with observation. Followup 3 months with UA - Urinalysis, Routine w reflex microscopic - BLADDER SCAN AMB NON-IMAGING   No follow-ups on file.  Nicolette Bang, MD  [facility] [place]

## 2022-06-26 NOTE — Progress Notes (Signed)
Pt is prepped for and in and out catherization. Patient was cleaned and prepped in a sterle fashion with betadine. A 14 fr catheter foley was inserted. Urine return was note 130 ml.  Performed by Levi Aland, CMA  Urine sent for ua.

## 2022-06-26 NOTE — Progress Notes (Signed)
post void residual=112 

## 2022-06-27 DIAGNOSIS — E860 Dehydration: Secondary | ICD-10-CM | POA: Diagnosis not present

## 2022-06-27 DIAGNOSIS — E440 Moderate protein-calorie malnutrition: Secondary | ICD-10-CM | POA: Diagnosis not present

## 2022-06-27 DIAGNOSIS — F0390 Unspecified dementia without behavioral disturbance: Secondary | ICD-10-CM | POA: Diagnosis not present

## 2022-06-27 DIAGNOSIS — G9341 Metabolic encephalopathy: Secondary | ICD-10-CM | POA: Diagnosis not present

## 2022-06-27 DIAGNOSIS — I10 Essential (primary) hypertension: Secondary | ICD-10-CM | POA: Diagnosis not present

## 2022-06-27 LAB — URINALYSIS, ROUTINE W REFLEX MICROSCOPIC
Bilirubin, UA: NEGATIVE
Glucose, UA: NEGATIVE
Ketones, UA: NEGATIVE
Leukocytes,UA: NEGATIVE
Nitrite, UA: NEGATIVE
Protein,UA: NEGATIVE
RBC, UA: NEGATIVE
Specific Gravity, UA: 1.01 (ref 1.005–1.030)
Urobilinogen, Ur: 0.2 mg/dL (ref 0.2–1.0)
pH, UA: 6.5 (ref 5.0–7.5)

## 2022-07-01 DIAGNOSIS — E860 Dehydration: Secondary | ICD-10-CM | POA: Diagnosis not present

## 2022-07-01 DIAGNOSIS — E440 Moderate protein-calorie malnutrition: Secondary | ICD-10-CM | POA: Diagnosis not present

## 2022-07-01 DIAGNOSIS — G9341 Metabolic encephalopathy: Secondary | ICD-10-CM | POA: Diagnosis not present

## 2022-07-01 DIAGNOSIS — I10 Essential (primary) hypertension: Secondary | ICD-10-CM | POA: Diagnosis not present

## 2022-07-01 DIAGNOSIS — F0390 Unspecified dementia without behavioral disturbance: Secondary | ICD-10-CM | POA: Diagnosis not present

## 2022-07-02 ENCOUNTER — Encounter: Payer: Self-pay | Admitting: Urology

## 2022-07-02 NOTE — Patient Instructions (Signed)
Urinary Tract Infection, Adult  A urinary tract infection (UTI) is an infection of any part of the urinary tract. The urinary tract includes the kidneys, ureters, bladder, and urethra. These organs make, store, and get rid of urine in the body. An upper UTI affects the ureters and kidneys. A lower UTI affects the bladder and urethra. What are the causes? Most urinary tract infections are caused by bacteria in your genital area around your urethra, where urine leaves your body. These bacteria grow and cause inflammation of your urinary tract. What increases the risk? You are more likely to develop this condition if: You have a urinary catheter that stays in place. You are not able to control when you urinate or have a bowel movement (incontinence). You are female and you: Use a spermicide or diaphragm for birth control. Have low estrogen levels. Are pregnant. You have certain genes that increase your risk. You are sexually active. You take antibiotic medicines. You have a condition that causes your flow of urine to slow down, such as: An enlarged prostate, if you are female. Blockage in your urethra. A kidney stone. A nerve condition that affects your bladder control (neurogenic bladder). Not getting enough to drink, or not urinating often. You have certain medical conditions, such as: Diabetes. A weak disease-fighting system (immunesystem). Sickle cell disease. Gout. Spinal cord injury. What are the signs or symptoms? Symptoms of this condition include: Needing to urinate right away (urgency). Frequent urination. This may include small amounts of urine each time you urinate. Pain or burning with urination. Blood in the urine. Urine that smells bad or unusual. Trouble urinating. Cloudy urine. Vaginal discharge, if you are female. Pain in the abdomen or the lower back. You may also have: Vomiting or a decreased appetite. Confusion. Irritability or tiredness. A fever or  chills. Diarrhea. The first symptom in older adults may be confusion. In some cases, they may not have any symptoms until the infection has worsened. How is this diagnosed? This condition is diagnosed based on your medical history and a physical exam. You may also have other tests, including: Urine tests. Blood tests. Tests for STIs (sexually transmitted infections). If you have had more than one UTI, a cystoscopy or imaging studies may be done to determine the cause of the infections. How is this treated? Treatment for this condition includes: Antibiotic medicine. Over-the-counter medicines to treat discomfort. Drinking enough water to stay hydrated. If you have frequent infections or have other conditions such as a kidney stone, you may need to see a health care provider who specializes in the urinary tract (urologist). In rare cases, urinary tract infections can cause sepsis. Sepsis is a life-threatening condition that occurs when the body responds to an infection. Sepsis is treated in the hospital with IV antibiotics, fluids, and other medicines. Follow these instructions at home:  Medicines Take over-the-counter and prescription medicines only as told by your health care provider. If you were prescribed an antibiotic medicine, take it as told by your health care provider. Do not stop using the antibiotic even if you start to feel better. General instructions Make sure you: Empty your bladder often and completely. Do not hold urine for long periods of time. Empty your bladder after sex. Wipe from front to back after urinating or having a bowel movement if you are female. Use each tissue only one time when you wipe. Drink enough fluid to keep your urine pale yellow. Keep all follow-up visits. This is important. Contact a health   care provider if: Your symptoms do not get better after 1-2 days. Your symptoms go away and then return. Get help right away if: You have severe pain in  your back or your lower abdomen. You have a fever or chills. You have nausea or vomiting. Summary A urinary tract infection (UTI) is an infection of any part of the urinary tract, which includes the kidneys, ureters, bladder, and urethra. Most urinary tract infections are caused by bacteria in your genital area. Treatment for this condition often includes antibiotic medicines. If you were prescribed an antibiotic medicine, take it as told by your health care provider. Do not stop using the antibiotic even if you start to feel better. Keep all follow-up visits. This is important. This information is not intended to replace advice given to you by your health care provider. Make sure you discuss any questions you have with your health care provider. Document Revised: 12/31/2019 Document Reviewed: 12/31/2019 Elsevier Patient Education  2023 Elsevier Inc.  

## 2022-07-03 DIAGNOSIS — G9341 Metabolic encephalopathy: Secondary | ICD-10-CM | POA: Diagnosis not present

## 2022-07-03 DIAGNOSIS — I10 Essential (primary) hypertension: Secondary | ICD-10-CM | POA: Diagnosis not present

## 2022-07-03 DIAGNOSIS — F0390 Unspecified dementia without behavioral disturbance: Secondary | ICD-10-CM | POA: Diagnosis not present

## 2022-07-03 DIAGNOSIS — E860 Dehydration: Secondary | ICD-10-CM | POA: Diagnosis not present

## 2022-07-03 DIAGNOSIS — E440 Moderate protein-calorie malnutrition: Secondary | ICD-10-CM | POA: Diagnosis not present

## 2022-07-08 DIAGNOSIS — E860 Dehydration: Secondary | ICD-10-CM | POA: Diagnosis not present

## 2022-07-08 DIAGNOSIS — F0390 Unspecified dementia without behavioral disturbance: Secondary | ICD-10-CM | POA: Diagnosis not present

## 2022-07-08 DIAGNOSIS — I10 Essential (primary) hypertension: Secondary | ICD-10-CM | POA: Diagnosis not present

## 2022-07-08 DIAGNOSIS — G9341 Metabolic encephalopathy: Secondary | ICD-10-CM | POA: Diagnosis not present

## 2022-07-08 DIAGNOSIS — E440 Moderate protein-calorie malnutrition: Secondary | ICD-10-CM | POA: Diagnosis not present

## 2022-07-09 DIAGNOSIS — F4323 Adjustment disorder with mixed anxiety and depressed mood: Secondary | ICD-10-CM | POA: Diagnosis not present

## 2022-07-10 ENCOUNTER — Ambulatory Visit (INDEPENDENT_AMBULATORY_CARE_PROVIDER_SITE_OTHER): Payer: Medicare Other | Admitting: Allergy & Immunology

## 2022-07-10 ENCOUNTER — Encounter: Payer: Self-pay | Admitting: Allergy & Immunology

## 2022-07-10 VITALS — BP 136/64 | HR 60 | Temp 98.0°F | Resp 14

## 2022-07-10 DIAGNOSIS — L501 Idiopathic urticaria: Secondary | ICD-10-CM

## 2022-07-10 DIAGNOSIS — L2089 Other atopic dermatitis: Secondary | ICD-10-CM

## 2022-07-10 DIAGNOSIS — F0390 Unspecified dementia without behavioral disturbance: Secondary | ICD-10-CM | POA: Diagnosis not present

## 2022-07-10 DIAGNOSIS — F4323 Adjustment disorder with mixed anxiety and depressed mood: Secondary | ICD-10-CM | POA: Diagnosis not present

## 2022-07-10 MED ORDER — CETIRIZINE HCL 10 MG PO TABS: 10.0000 mg | ORAL_TABLET | Freq: Two times a day (BID) | ORAL | 1 refills | Status: AC

## 2022-07-10 MED ORDER — GABAPENTIN 100 MG PO CAPS: 100.0000 mg | ORAL_CAPSULE | Freq: Every day | ORAL | 1 refills | Status: DC

## 2022-07-10 MED ORDER — EPICERAM EX EMUL: 1.0000 | Freq: Two times a day (BID) | CUTANEOUS | 12 refills | Status: AC

## 2022-07-10 NOTE — Patient Instructions (Addendum)
Urticaria with overlying atopic dermatitis - Stop the Claritin and start Zyrtec 10mg  twice daily. - Stop the hydroxyzine and start gabapentin  - Continue with triamcinolone ointment twice daily to the itchiest spots. - For stubborn red, itchy areas on your face, begin desonide 0.05% ointment up to twice a day as needed. Do not use this medication longer than 2 weeks in a row. - For stubborn red, itchy areas below your face apply triamcinolone 0.1% ointment up to twice a day as needed. Do not use this medication longer than 2 weeks in a row. - We are starting Episeram twice daily as needed to calm the itching. - We are going to work on getting Dupixent approved instead (this will target the itching more effectively).   2. Return in about 4 weeks (around 08/07/2022).    Please inform us of any Emergency Department visits, hospitalizations, or changes in symptoms. Call us before going to the ED for breathing or allergy symptoms since we might be able to fit you in for a sick visit. Feel free to contact us anytime with any questions, problems, or concerns.  It was a pleasure to see you again today!  Websites that have reliable patient information: 1. American Academy of Asthma, Allergy, and Immunology: www.aaaai.org 2. Food Allergy Research and Education (FARE): foodallergy.org 3. Mothers of Asthmatics: http://www.asthmacommunitynetwork.org 4. American College of Allergy, Asthma, and Immunology: www.acaai.org   COVID-19 Vaccine Information can be found at: [email or web address].uk For questions related to vaccine distribution or appointments, please email [email or web address] or call [phone].   We realize that you might be concerned about having an allergic reaction to the COVID19 vaccines. To help with that concern, WE ARE OFFERING THE COVID19 VACCINES IN OUR OFFICE! Ask the front desk for dates!     "Like" [place] on Facebook and  Instagram for our latest updates!      A healthy democracy works best when [employer] participate! Make sure you are registered to vote! If you have moved or changed any of your contact information, you will need to get this updated before voting!  In some cases, you MAY be able to register to vote online: [email or web address]

## 2022-07-10 NOTE — Progress Notes (Unsigned)
FOLLOW UP  Date of Service/Encounter:  07/10/22   Assessment:   Chronic urticaria - with negative skin testing (environmental plus most common foods)  Alpha-gal syndrome  Elevated IgE  Elevated eosinophils (800)   No biopsy performed - previously seen by [place]    Unfortunately, Ms. Kooi is making no headway with regards to her itch control.  Her daughter reports that she is just itching constantly, even at night which is interrupting her sleep.  Ms. Delahunt thinks that this is more of a habit, but she does have memory loss and her daughter reports that over habit before the rash started.  She thinks that if we could just control her itching, she would get better sleep and have a better quality of life.  We are going to revamp her plan and start Zyrtec twice daily and lieu of Claritin.  We are also stopping the hydroxyzine and starting gabapentin 100 mg at night, which we can increase.  It is such a sedating medicine that I am hesitant to start at a higher dose.  Some of her lesions are consistent with eczema, so I think we could probably get Dupixent approved for treatment.  I think that mechanism of action would help control her itching more effectively than the Xolair, which is certainly the most concerning symptom at this point.  Plan/Recommendations:   Urticaria with overlying atopic dermatitis - Stop the Claritin and start Zyrtec 10mg  twice daily. - Stop the hydroxyzine and start gabapentin 100mg  nightly. - Continue with triamcinolone ointment twice daily to the itchiest spots. - For stubborn red, itchy areas on your face, begin desonide 0.05% ointment up to twice a day as needed. Do not use this medication longer than 2 weeks in a row. - For stubborn red, itchy areas below your face apply triamcinolone 0.1% ointment up to twice a day as needed. Do not use this medication longer than 2 weeks in a row. - We are starting Episeram twice daily as needed to calm the  itching. - We are going to work on getting Dupixent approved instead (this will target the itching more effectively).   2. Return in about 4 weeks (around 08/07/2022).    Subjective:   Susan Bennett is a 82 y.o. female presenting today for follow up of  Chief Complaint  Patient presents with   Follow-up    Is still scratching, tired. Has mittens one to help prevent scratching. Wants to see if there is another treatment or strategy to help.     Susan Bennett has a history of the following: Patient Active Problem List   Diagnosis Date Noted   Idiopathic urticaria 06/19/2022   Bilateral lower extremity edema 06/19/2022   Protrusion of lumbar intervertebral disc 11/15/2021   Alzheimer's dementia without behavioral disturbance ([place]) 12/02/2016   Vitamin B12 deficiency 12/02/2016   Essential hypertension 04/19/2016   Mild episode of recurrent major depressive disorder ([place]) 04/19/2016    History obtained from: chart review and patient and her daughter .  Susan Bennett is a 82 y.o. female presenting for a follow up visit. She was last seen in January 2024 by Webb Silversmith.  At time, she was continued at time.  She was started on Claritin 10 mg once a day to decrease her itch.  She was also continued on Xolair once monthly.  For atopic dermatitis, she was started on triamcinolone as well as desonide.  We obtained labs that we first met her in December.  She  had an elevated alpha gal panel.  She also had a very elevated IgE of greater than 2000.  Her CBC was notable for an elevated eosinophil count of 800.  Her CRP was elevated.  She also had an elevated chronic urticaria panel.  SPEP was within normal limits.  Since last visit, she has not done any better at all. She remains on the Claritin daily and the hydroxyzine at night. She has the triamcinolone that is not working at all.  She lives in a nursing home, so they are very good about using the medicines.  She previously was on prednisone which did not do much  to help.  She came to me on Benadryl as well which did not do much either as well as triamcinolone and Zyrtec.  At her first visit, we put her on Vanicream 2-3 times a day as well as Epiceram.  We also put her on Allegra and Pepcid twice a day as well as doxepin and Singulair at night.  She has been on Xolair and received her second injection today.  It has not helped with the itch at all.  There were no new medications started at all last year.  Medicines that she has been on have been in her medication list for years now.  There are no new exposures at her nursing home that they can think of.  Symptoms have been going on for 2 to 3 months when I saw her in December, making September a likely start date for the rash.  Of note, she did have distinct urticarial lesions over her entire body at the last visit.  She did see [facility] [place] who works with Dr. Juel Burrow office in [place] and [place]) whom she saw prior to her seeing me). She does not have a follow up with her scheduled.  Her daughter said that we would just take care of her from here.  Otherwise, there have been no changes to her past medical history, surgical history, family history, or social history.    Review of Systems  Constitutional: Negative.  Negative for chills, fever, malaise/fatigue and weight loss.  HENT: Negative.  Negative for congestion, ear discharge and ear pain.   Eyes:  Negative for pain, discharge and redness.  Respiratory:  Negative for cough, sputum production, shortness of breath and wheezing.   Cardiovascular: Negative.  Negative for chest pain and palpitations.  Gastrointestinal:  Negative for abdominal pain, heartburn, nausea and vomiting.  Skin:  Positive for itching and rash.  Neurological:  Negative for dizziness and headaches.  Endo/Heme/Allergies:  Negative for environmental allergies. Does not bruise/bleed easily.       Objective:   Blood pressure 136/64, pulse 60, temperature  98 F (36.7 C), resp. rate 14, SpO2 93 %. There is no height or weight on file to calculate BMI.    Physical Exam Vitals reviewed.  Constitutional:      Appearance: She is well-developed.     Comments: Mostly responsive to questions, although she is hard of hearing.   HENT:     Head: Normocephalic and atraumatic.     Right Ear: Tympanic membrane, ear canal and external ear normal. No drainage, swelling or tenderness. Tympanic membrane is not injected, scarred, erythematous, retracted or bulging.     Left Ear: Tympanic membrane, ear canal and external ear normal. No drainage, swelling or tenderness. Tympanic membrane is not injected, scarred, erythematous, retracted or bulging.     Nose: No nasal deformity, septal deviation, mucosal  edema or rhinorrhea.     Right Turbinates: Enlarged.     Left Turbinates: Enlarged.     Right Sinus: No maxillary sinus tenderness or frontal sinus tenderness.     Left Sinus: No maxillary sinus tenderness or frontal sinus tenderness.     Mouth/Throat:     Mouth: Mucous membranes are not pale and not dry.     Pharynx: Uvula midline.  Eyes:     General:        Right eye: No discharge.        Left eye: No discharge.     Conjunctiva/sclera: Conjunctivae normal.     Right eye: Right conjunctiva is not injected. No chemosis.    Left eye: Left conjunctiva is not injected. No chemosis.    Pupils: Pupils are equal, round, and reactive to light.  Cardiovascular:     Rate and Rhythm: Normal rate and regular rhythm.     Heart sounds: Normal heart sounds.  Pulmonary:     Effort: Pulmonary effort is normal. No tachypnea, accessory muscle usage or respiratory distress.     Breath sounds: Normal breath sounds. No wheezing, rhonchi or rales.  Chest:     Chest wall: No tenderness.  Abdominal:     Tenderness: There is no abdominal tenderness. There is no guarding or rebound.  Lymphadenopathy:     Head:     Right side of head: No submandibular, tonsillar or  occipital adenopathy.     Left side of head: No submandibular, tonsillar or occipital adenopathy.     Cervical: No cervical adenopathy.  Skin:    General: Skin is warm.     Capillary Refill: Capillary refill takes less than 2 seconds.     Coloration: Skin is not pale.     Findings: Rash present. No abrasion, erythema or petechiae. Rash is urticarial. Rash is not papular or vesicular.     Comments: Today she has more confluent redness with excoriations on her bilateral arms.  She has some more dry, eczematous areas on her wrist and elbows.  She has some eczematous lesions on her neck as well as her bilateral thighs.  Neurological:     Mental Status: She is alert.  Psychiatric:        Behavior: Behavior is cooperative.      Diagnostic studies: none       Salvatore Marvel, MD  [facility] [place]

## 2022-07-11 ENCOUNTER — Telehealth: Payer: Self-pay | Admitting: *Deleted

## 2022-07-11 DIAGNOSIS — E440 Moderate protein-calorie malnutrition: Secondary | ICD-10-CM | POA: Diagnosis not present

## 2022-07-11 DIAGNOSIS — I10 Essential (primary) hypertension: Secondary | ICD-10-CM | POA: Diagnosis not present

## 2022-07-11 DIAGNOSIS — G9341 Metabolic encephalopathy: Secondary | ICD-10-CM | POA: Diagnosis not present

## 2022-07-11 DIAGNOSIS — E860 Dehydration: Secondary | ICD-10-CM | POA: Diagnosis not present

## 2022-07-11 DIAGNOSIS — F0390 Unspecified dementia without behavioral disturbance: Secondary | ICD-10-CM | POA: Diagnosis not present

## 2022-07-11 NOTE — Telephone Encounter (Signed)
Thanks Tammy 

## 2022-07-11 NOTE — Telephone Encounter (Signed)
-----   Message from Valentina Shaggy, MD sent at 07/11/2022  7:49 AM EST ----- Any chance of getting Dupixent approved? I think that might work better to control her itching.

## 2022-07-11 NOTE — Telephone Encounter (Signed)
Spoke to patient daughter in regards to changing from [place] to [place]. Will PAP app for patient to sign and will need her [place] card [employer] to try and get free medication

## 2022-07-14 DIAGNOSIS — E440 Moderate protein-calorie malnutrition: Secondary | ICD-10-CM | POA: Diagnosis not present

## 2022-07-14 DIAGNOSIS — F0390 Unspecified dementia without behavioral disturbance: Secondary | ICD-10-CM | POA: Diagnosis not present

## 2022-07-14 DIAGNOSIS — I10 Essential (primary) hypertension: Secondary | ICD-10-CM | POA: Diagnosis not present

## 2022-07-14 DIAGNOSIS — G9341 Metabolic encephalopathy: Secondary | ICD-10-CM | POA: Diagnosis not present

## 2022-07-14 DIAGNOSIS — E860 Dehydration: Secondary | ICD-10-CM | POA: Diagnosis not present

## 2022-07-16 DIAGNOSIS — F4323 Adjustment disorder with mixed anxiety and depressed mood: Secondary | ICD-10-CM | POA: Diagnosis not present

## 2022-07-19 DIAGNOSIS — E440 Moderate protein-calorie malnutrition: Secondary | ICD-10-CM | POA: Diagnosis not present

## 2022-07-19 DIAGNOSIS — I10 Essential (primary) hypertension: Secondary | ICD-10-CM | POA: Diagnosis not present

## 2022-07-19 DIAGNOSIS — E860 Dehydration: Secondary | ICD-10-CM | POA: Diagnosis not present

## 2022-07-19 DIAGNOSIS — G9341 Metabolic encephalopathy: Secondary | ICD-10-CM | POA: Diagnosis not present

## 2022-07-19 DIAGNOSIS — F0390 Unspecified dementia without behavioral disturbance: Secondary | ICD-10-CM | POA: Diagnosis not present

## 2022-07-23 DIAGNOSIS — M79674 Pain in right toe(s): Secondary | ICD-10-CM | POA: Diagnosis not present

## 2022-07-23 DIAGNOSIS — F339 Major depressive disorder, recurrent, unspecified: Secondary | ICD-10-CM | POA: Diagnosis not present

## 2022-07-23 DIAGNOSIS — M79675 Pain in left toe(s): Secondary | ICD-10-CM | POA: Diagnosis not present

## 2022-07-23 DIAGNOSIS — L600 Ingrowing nail: Secondary | ICD-10-CM | POA: Diagnosis not present

## 2022-07-23 DIAGNOSIS — L500 Allergic urticaria: Secondary | ICD-10-CM | POA: Diagnosis not present

## 2022-07-23 DIAGNOSIS — F0390 Unspecified dementia without behavioral disturbance: Secondary | ICD-10-CM | POA: Diagnosis not present

## 2022-07-23 DIAGNOSIS — E860 Dehydration: Secondary | ICD-10-CM | POA: Diagnosis not present

## 2022-07-23 DIAGNOSIS — N19 Unspecified kidney failure: Secondary | ICD-10-CM | POA: Diagnosis not present

## 2022-07-23 DIAGNOSIS — I10 Essential (primary) hypertension: Secondary | ICD-10-CM | POA: Diagnosis not present

## 2022-07-23 DIAGNOSIS — F4323 Adjustment disorder with mixed anxiety and depressed mood: Secondary | ICD-10-CM | POA: Diagnosis not present

## 2022-07-25 DIAGNOSIS — E440 Moderate protein-calorie malnutrition: Secondary | ICD-10-CM | POA: Diagnosis not present

## 2022-07-25 DIAGNOSIS — E860 Dehydration: Secondary | ICD-10-CM | POA: Diagnosis not present

## 2022-07-25 DIAGNOSIS — I10 Essential (primary) hypertension: Secondary | ICD-10-CM | POA: Diagnosis not present

## 2022-07-25 DIAGNOSIS — G9341 Metabolic encephalopathy: Secondary | ICD-10-CM | POA: Diagnosis not present

## 2022-07-25 DIAGNOSIS — F0390 Unspecified dementia without behavioral disturbance: Secondary | ICD-10-CM | POA: Diagnosis not present

## 2022-07-29 ENCOUNTER — Telehealth: Payer: Self-pay

## 2022-07-29 NOTE — Telephone Encounter (Signed)
Susan Bennett, Pharmacy Technician/[facility] [phone], called in - DOB verified - requested verification on directions for Episeram lotion.  Susan Bennett stated he received Apply topically twice daily with meals.  Susan Bennett was advised direction should be apply topically twice daily as needed to calm the itching.  Susan Bennett verbalized understanding, no further questions.

## 2022-07-31 ENCOUNTER — Other Ambulatory Visit: Payer: Self-pay

## 2022-07-31 ENCOUNTER — Ambulatory Visit (INDEPENDENT_AMBULATORY_CARE_PROVIDER_SITE_OTHER): Payer: Medicare Other | Admitting: Allergy & Immunology

## 2022-07-31 ENCOUNTER — Encounter: Payer: Self-pay | Admitting: Allergy & Immunology

## 2022-07-31 VITALS — BP 120/58 | HR 60 | Temp 98.3°F | Resp 20

## 2022-07-31 DIAGNOSIS — L501 Idiopathic urticaria: Secondary | ICD-10-CM | POA: Diagnosis not present

## 2022-07-31 MED ORDER — GABAPENTIN 100 MG PO CAPS: 200.0000 mg | ORAL_CAPSULE | Freq: Every day | ORAL | 5 refills | Status: DC

## 2022-07-31 NOTE — Patient Instructions (Addendum)
Urticaria with overlying atopic dermatitis - Continue with the Zyrtec '10mg'$  twice daily - Increase gabapentin to '200mg'$  nightly.  - For stubborn red, itchy areas on your face, begin desonide 0.05% ointment up to twice a day as needed. Do not use this medication longer than 2 weeks in a row. - For stubborn red, itchy areas below your face apply triamcinolone 0.1% ointment up to twice a day as needed. Do not use this medication longer than 2 weeks in a row. - Start Episeram twice daily as needed to calm the itching (hopefully this comes in).  - We will continue with Xolair for now until we change fully to [place].  - She should make an appointment with her Dermatologist to look at the mole on her face.   2. Return in about 3 months (around 10/29/2022).    Please inform us of any Emergency Department visits, hospitalizations, or changes in symptoms. Call us before going to the ED for breathing or allergy symptoms since we might be able to fit you in for a sick visit. Feel free to contact us anytime with any questions, problems, or concerns.  It was a pleasure to see you again today!  Websites that have reliable patient information: 1. American Academy of Asthma, Allergy, and Immunology: www.aaaai.org 2. Food Allergy Research and Education (FARE): foodallergy.org 3. Mothers of Asthmatics: http://www.asthmacommunitynetwork.org 4. American College of Allergy, Asthma, and Immunology: www.acaai.org   COVID-19 Vaccine Information can be found at: ShippingScam.co.uk For questions related to vaccine distribution or appointments, please email [email or web address] or call [phone].   We realize that you might be concerned about having an allergic reaction to the COVID19 vaccines. To help with that concern, WE ARE OFFERING THE COVID19 VACCINES IN OUR OFFICE! Ask the front desk for dates!     "Like" [place] on Facebook and Instagram for our  latest updates!      A healthy democracy works best when [employer] participate! Make sure you are registered to vote! If you have moved or changed any of your contact information, you will need to get this updated before voting!  In some cases, you MAY be able to register to vote online: [email or web address]

## 2022-07-31 NOTE — Progress Notes (Signed)
FOLLOW UP  Date of Service/Encounter:  07/31/22   Assessment:   Chronic urticaria - with negative skin testing (environmental plus most common foods), now on Xolair monthly   Alpha-gal syndrome   Elevated IgE   Elevated eosinophils (800)    No biopsy performed - previously seen by PA Amber Register   Plan/Recommendations:   Urticaria with overlying atopic dermatitis - Continue with the Zyrtec '10mg'$  twice daily - Increase gabapentin to '200mg'$  nightly.  - For stubborn red, itchy areas on your face, begin desonide 0.05% ointment up to twice a day as needed. Do not use this medication longer than 2 weeks in a row. - For stubborn red, itchy areas below your face apply triamcinolone 0.1% ointment up to twice a day as needed. Do not use this medication longer than 2 weeks in a row. - Start Episeram twice daily as needed to calm the itching (hopefully this comes in).  - We will continue with Xolair for now until we change fully to [place].  - She should make an appointment with her Dermatologist to look at the mole on her face.   2. Return in about 3 months (around 10/29/2022).    Subjective:   Susan Bennett is a 82 y.o. female presenting today for follow up of No chief complaint on file.   Susan Bennett has a history of the following: Patient Active Problem List   Diagnosis Date Noted   Idiopathic urticaria 06/19/2022   Bilateral lower extremity edema 06/19/2022   Protrusion of lumbar intervertebral disc 11/15/2021   Alzheimer's dementia without behavioral disturbance ([place]) 12/02/2016   Vitamin B12 deficiency 12/02/2016   Essential hypertension 04/19/2016   Mild episode of recurrent major depressive disorder ([place]) 04/19/2016    History obtained from: chart review and patient.  Susan Bennett is a 82 y.o. female presenting for a follow up visit.  We last saw her in February 2024.  At that time, we stopped the Claritin and started Zyrtec 10 mg twice daily.  We stopped hydroxyzine and  starting gabapentin 100 mg nightly.  We continue with the triamcinolone ointment twice daily to the worst spots.  We also started desonide twice a day as needed to the areas on her face.  We gave her samples of Episeram and worked on getting [place] approved instead.  Since last visit, she has been the same. She is accompanied by someone from the nursing home today.   She had some hives on her back and she got prednisone. We were planning to change to Dupixent. Apparnetly that was going to cost too much, but they are working on it. Her accompanying employee from [employer] says that the creams are helping a bit more than they were when she came to the nursing home. She does showers twice weekly and they moisturize her extensively after that.   Gabapentin did not seme to help at all. Episeram has not arrived. Tiny tubes unsure whrether this helps at all. They were very small tubes and the employee is not sure whether there was much difference. Gabapentin has not been causing much in the way of sedation. On the other hand, it is not clear that the medication is working to control her itching.   She does have a mole on her left jaw line. This has been there since she arrived at [facility]. It might have gotten a bit bigger. She has not seen Dermatology since she moved into the facility in January 2024.   Otherwise, there  have been no changes to her past medical history, surgical history, family history, or social history.    Review of Systems  Constitutional: Negative.  Negative for chills, fever, malaise/fatigue and weight loss.  HENT: Negative.  Negative for congestion, ear discharge and ear pain.   Eyes:  Negative for pain, discharge and redness.  Respiratory:  Negative for cough, sputum production, shortness of breath and wheezing.   Cardiovascular: Negative.  Negative for chest pain and palpitations.  Gastrointestinal:  Negative for abdominal pain, constipation, diarrhea, heartburn, nausea and  vomiting.  Skin:  Positive for itching and rash.  Neurological:  Negative for dizziness and headaches.  Endo/Heme/Allergies:  Positive for environmental allergies. Does not bruise/bleed easily.       Objective:   Blood pressure (!) 120/58, pulse 60, temperature 98.3 F (36.8 C), resp. rate 20, SpO2 96 %. There is no height or weight on file to calculate BMI.    Physical Exam Vitals reviewed.  Constitutional:      Appearance: She is well-developed.     Comments: Mostly responsive to questions, although she is hard of hearing.   HENT:     Head: Normocephalic and atraumatic.     Right Ear: Tympanic membrane, ear canal and external ear normal. No drainage, swelling or tenderness. Tympanic membrane is not injected, scarred, erythematous, retracted or bulging.     Left Ear: Tympanic membrane, ear canal and external ear normal. No drainage, swelling or tenderness. Tympanic membrane is not injected, scarred, erythematous, retracted or bulging.     Nose: No nasal deformity, septal deviation, mucosal edema or rhinorrhea.     Right Turbinates: Enlarged. Not swollen or pale.     Left Turbinates: Enlarged. Not swollen or pale.     Right Sinus: No maxillary sinus tenderness or frontal sinus tenderness.     Left Sinus: No maxillary sinus tenderness or frontal sinus tenderness.     Mouth/Throat:     Mouth: Mucous membranes are not pale and not dry.     Pharynx: Uvula midline.  Eyes:     General:        Right eye: No discharge.        Left eye: No discharge.     Conjunctiva/sclera: Conjunctivae normal.     Right eye: Right conjunctiva is not injected. No chemosis.    Left eye: Left conjunctiva is not injected. No chemosis.    Pupils: Pupils are equal, round, and reactive to light.  Cardiovascular:     Rate and Rhythm: Normal rate and regular rhythm.     Heart sounds: Normal heart sounds.  Pulmonary:     Effort: Pulmonary effort is normal. No tachypnea, accessory muscle usage or  respiratory distress.     Breath sounds: Normal breath sounds. No wheezing, rhonchi or rales.  Chest:     Chest wall: No tenderness.  Abdominal:     Tenderness: There is no abdominal tenderness. There is no guarding or rebound.  Lymphadenopathy:     Head:     Right side of head: No submandibular, tonsillar or occipital adenopathy.     Left side of head: No submandibular, tonsillar or occipital adenopathy.     Cervical: No cervical adenopathy.  Skin:    General: Skin is warm.     Capillary Refill: Capillary refill takes less than 2 seconds.     Coloration: Skin is not pale.     Findings: Rash present. No abrasion, erythema or petechiae. Rash is urticarial. Rash is not papular  or vesicular.     Comments: Today she has more confluent redness with excoriations on her bilateral arms.  She has some more dry, eczematous areas on her wrist and elbows.  She has some eczematous lesions on her neck as well as her bilateral thighs. She also has a large mold on her left jawline (I do not remember seeing this in the past).   Neurological:     Mental Status: She is alert.  Psychiatric:        Behavior: Behavior is cooperative.      Diagnostic studies: none     Salvatore Marvel, MD  [facility] [place] [facility] [place]

## 2022-08-06 DIAGNOSIS — F4323 Adjustment disorder with mixed anxiety and depressed mood: Secondary | ICD-10-CM | POA: Diagnosis not present

## 2022-08-07 DIAGNOSIS — F4323 Adjustment disorder with mixed anxiety and depressed mood: Secondary | ICD-10-CM | POA: Diagnosis not present

## 2022-08-07 DIAGNOSIS — F0390 Unspecified dementia without behavioral disturbance: Secondary | ICD-10-CM | POA: Diagnosis not present

## 2022-08-09 ENCOUNTER — Telehealth: Payer: Self-pay | Admitting: Allergy & Immunology

## 2022-08-09 DIAGNOSIS — L299 Pruritus, unspecified: Secondary | ICD-10-CM

## 2022-08-09 DIAGNOSIS — L501 Idiopathic urticaria: Secondary | ICD-10-CM

## 2022-08-09 NOTE — Telephone Encounter (Signed)
Sharyn Lull the Care Coordinator at the [facility] called in and states that they have already run out of the [facility] and states that on the order for the nursing home it stated to use twice a day scheduled but on the prescription with [facility]  it said to use twice a day as needed.  Sharyn Lull states they were using it twice a day as scheduled and it still is not helping so the daughter wants to know if we can get a biopsy done.  Sharyn Lull also would like to know if Dr. Ernst Bowler can call in the 225g tube instead of the 90g tube.  Michelle's call back number is [phone].

## 2022-08-12 NOTE — Telephone Encounter (Signed)
I would love to get a biopsy done. Let's send her to see the new Dermatologist in [place]. Referral placed.   Can someone send in the larger tube of the medication? We are also working on changing to [employer].   Salvatore Marvel, MD [facility] [place] [facility] [place]

## 2022-08-12 NOTE — Addendum Note (Signed)
Addended by: Valentina Shaggy on: 08/12/2022 04:18 PM   Modules accepted: Orders

## 2022-08-13 DIAGNOSIS — M545 Low back pain, unspecified: Secondary | ICD-10-CM | POA: Diagnosis not present

## 2022-08-13 DIAGNOSIS — F0390 Unspecified dementia without behavioral disturbance: Secondary | ICD-10-CM | POA: Diagnosis not present

## 2022-08-13 DIAGNOSIS — F4323 Adjustment disorder with mixed anxiety and depressed mood: Secondary | ICD-10-CM | POA: Diagnosis not present

## 2022-08-13 DIAGNOSIS — E860 Dehydration: Secondary | ICD-10-CM | POA: Diagnosis not present

## 2022-08-13 DIAGNOSIS — L500 Allergic urticaria: Secondary | ICD-10-CM | POA: Diagnosis not present

## 2022-08-13 DIAGNOSIS — I10 Essential (primary) hypertension: Secondary | ICD-10-CM | POA: Diagnosis not present

## 2022-08-14 ENCOUNTER — Ambulatory Visit (INDEPENDENT_AMBULATORY_CARE_PROVIDER_SITE_OTHER): Payer: Medicare Other | Admitting: Urology

## 2022-08-14 ENCOUNTER — Encounter: Payer: Self-pay | Admitting: Urology

## 2022-08-14 VITALS — BP 166/82 | HR 62

## 2022-08-14 DIAGNOSIS — Z87440 Personal history of urinary (tract) infections: Secondary | ICD-10-CM | POA: Diagnosis not present

## 2022-08-14 DIAGNOSIS — N3000 Acute cystitis without hematuria: Secondary | ICD-10-CM | POA: Diagnosis not present

## 2022-08-14 LAB — BLADDER SCAN AMB NON-IMAGING: Scan Result: 171

## 2022-08-14 NOTE — Patient Instructions (Signed)
Urinary Tract Infection, Adult  A urinary tract infection (UTI) is an infection of any part of the urinary tract. The urinary tract includes the kidneys, ureters, bladder, and urethra. These organs make, store, and get rid of urine in the body. An upper UTI affects the ureters and kidneys. A lower UTI affects the bladder and urethra. What are the causes? Most urinary tract infections are caused by bacteria in your genital area around your urethra, where urine leaves your body. These bacteria grow and cause inflammation of your urinary tract. What increases the risk? You are more likely to develop this condition if: You have a urinary catheter that stays in place. You are not able to control when you urinate or have a bowel movement (incontinence). You are female and you: Use a spermicide or diaphragm for birth control. Have low estrogen levels. Are pregnant. You have certain genes that increase your risk. You are sexually active. You take antibiotic medicines. You have a condition that causes your flow of urine to slow down, such as: An enlarged prostate, if you are female. Blockage in your urethra. A kidney stone. A nerve condition that affects your bladder control (neurogenic bladder). Not getting enough to drink, or not urinating often. You have certain medical conditions, such as: Diabetes. A weak disease-fighting system (immunesystem). Sickle cell disease. Gout. Spinal cord injury. What are the signs or symptoms? Symptoms of this condition include: Needing to urinate right away (urgency). Frequent urination. This may include small amounts of urine each time you urinate. Pain or burning with urination. Blood in the urine. Urine that smells bad or unusual. Trouble urinating. Cloudy urine. Vaginal discharge, if you are female. Pain in the abdomen or the lower back. You may also have: Vomiting or a decreased appetite. Confusion. Irritability or tiredness. A fever or  chills. Diarrhea. The first symptom in older adults may be confusion. In some cases, they may not have any symptoms until the infection has worsened. How is this diagnosed? This condition is diagnosed based on your medical history and a physical exam. You may also have other tests, including: Urine tests. Blood tests. Tests for STIs (sexually transmitted infections). If you have had more than one UTI, a cystoscopy or imaging studies may be done to determine the cause of the infections. How is this treated? Treatment for this condition includes: Antibiotic medicine. Over-the-counter medicines to treat discomfort. Drinking enough water to stay hydrated. If you have frequent infections or have other conditions such as a kidney stone, you may need to see a health care provider who specializes in the urinary tract (urologist). In rare cases, urinary tract infections can cause sepsis. Sepsis is a life-threatening condition that occurs when the body responds to an infection. Sepsis is treated in the hospital with IV antibiotics, fluids, and other medicines. Follow these instructions at home:  Medicines Take over-the-counter and prescription medicines only as told by your health care provider. If you were prescribed an antibiotic medicine, take it as told by your health care provider. Do not stop using the antibiotic even if you start to feel better. General instructions Make sure you: Empty your bladder often and completely. Do not hold urine for long periods of time. Empty your bladder after sex. Wipe from front to back after urinating or having a bowel movement if you are female. Use each tissue only one time when you wipe. Drink enough fluid to keep your urine pale yellow. Keep all follow-up visits. This is important. Contact a health   care provider if: Your symptoms do not get better after 1-2 days. Your symptoms go away and then return. Get help right away if: You have severe pain in  your back or your lower abdomen. You have a fever or chills. You have nausea or vomiting. Summary A urinary tract infection (UTI) is an infection of any part of the urinary tract, which includes the kidneys, ureters, bladder, and urethra. Most urinary tract infections are caused by bacteria in your genital area. Treatment for this condition often includes antibiotic medicines. If you were prescribed an antibiotic medicine, take it as told by your health care provider. Do not stop using the antibiotic even if you start to feel better. Keep all follow-up visits. This is important. This information is not intended to replace advice given to you by your health care provider. Make sure you discuss any questions you have with your health care provider. Document Revised: 12/31/2019 Document Reviewed: 12/31/2019 Elsevier Patient Education  2023 Elsevier Inc.  

## 2022-08-14 NOTE — Progress Notes (Signed)
Pt is prepped for and in and out catherization. Patient was cleaned and prepped in a sterle fashion with betadine. A 14 fr catheter foley was inserted. Urine return was note 200 ml.  Performed by Marisue Brooklyn, CMA  Urine sent lab

## 2022-08-14 NOTE — Progress Notes (Signed)
08/14/2022 2:58 PM   Susan Bennett 28-Mar-1941 [id number]  Referring provider: Janora Norlander, DO [place],  [place] [id number]  [place] UTI   HPI: Susan Bennett is a 82yo here for followup for UTI. UA clear. No UTis since last visit. She denies nay significant LUTS. No hematuria or dysuria.    PMH: Past Medical History:  Diagnosis Date   Chronic diarrhea    Contact dermatitis and other eczema, due to unspecified cause    Depressive disorder, not elsewhere classified    Osteoporosis, unspecified    Other and unspecified hyperlipidemia    Other B-complex deficiencies    Unspecified essential hypertension    Unspecified gastritis and gastroduodenitis without mention of hemorrhage    Unspecified vitamin D deficiency     Surgical History: Past Surgical History:  Procedure Laterality Date   COLONOSCOPY     9 YEARS AGO    Home Medications:  Allergies as of 08/14/2022   No Known Allergies      Medication List        Accurate as of August 14, 2022  2:58 PM. If you have any questions, ask your nurse or doctor.          benazepril 40 MG tablet Commonly known as: LOTENSIN Take 1 tablet (40 mg total) by mouth daily.   cephALEXin 500 MG capsule Commonly known as: KEFLEX Take 1 capsule (500 mg total) by mouth 3 (three) times daily.   CERAVE EX Apply topically 2 (two) times daily as needed.   cetirizine 10 MG tablet Commonly known as: ZYRTEC Take 1 tablet (10 mg total) by mouth 2 (two) times daily.   Coricidin HBP 10-325-2 MG Tabs Generic drug: DM-APAP-CPM Take 1 tablet by mouth every 6 (six) hours as needed (Every 6-8 hours as needed).   desonide 0.05 % ointment Commonly known as: DESOWEN Apply 1 Application topically 2 (two) times daily.   diclofenac 50 MG EC tablet Commonly known as: VOLTAREN TAKE 1 TABLET BY MOUTH TWICE DAILY AS NEEDED FOR PAIN (ONLY USE IF TYLENOL NOT WORKING)   doxepin 10 MG capsule Commonly known as: SINEQUAN Take 1  capsule (10 mg total) by mouth at bedtime.   EPINEPHrine 0.3 mg/0.3 mL Soaj injection Commonly known as: EPI-PEN Inject 0.3 mg into the muscle as needed for anaphylaxis.   famotidine 20 MG tablet Commonly known as: PEPCID Take 1 tablet (20 mg total) by mouth 2 (two) times daily.   furosemide 20 MG tablet Commonly known as: LASIX Take 1 tablet (20 mg total) by mouth daily. What changed: how much to take   gabapentin 100 MG capsule Commonly known as: Neurontin Take 2 capsules (200 mg total) by mouth at bedtime.   lidocaine 5 % Commonly known as: Lidoderm Place 1 patch onto the skin daily. Remove & Discard patch within 12 hours or as directed by MD   loratadine 10 MG tablet Commonly known as: CLARITIN Take 1 tablet (10 mg total) by mouth daily.   memantine 10 MG tablet Commonly known as: NAMENDA Take 1 tablet (10 mg total) by mouth 2 (two) times daily.   montelukast 10 MG tablet Commonly known as: SINGULAIR Take 1 tablet (10 mg total) by mouth at bedtime.   polyethylene glycol powder 17 GM/SCOOP powder Commonly known as: GLYCOLAX/MIRALAX Take 17 g by mouth once.   PREDNISONE PO Take by mouth. LAST DAY WILL BE 06/23/2022   sertraline 50 MG tablet Commonly known as: Zoloft Take 1.5  tablets (75 mg total) by mouth daily.   triamcinolone 0.1% oint-Cerave equivalent lotion 1:1 mixture Apply topically 2 (two) times daily.   triamcinolone ointment 0.1 % Commonly known as: KENALOG Apply 1 Application topically 2 (two) times daily.   triamcinolone ointment 0.1 % Commonly known as: KENALOG Apply 1 Application topically 2 (two) times daily.        Allergies: No Known Allergies  Family History: Family History  Problem Relation Age of Onset   Healthy Sister    Healthy Brother    Throat cancer Brother    Stroke Brother    Heart disease Brother    Throat cancer Maternal Aunt     Social History:  reports that she has been smoking cigarettes. She has been smoking  an average of .25 packs per day. She has been exposed to tobacco smoke. She has never used smokeless tobacco. She reports that she does not drink alcohol and does not use drugs.  ROS: All other review of systems were reviewed and are negative except what is noted above in HPI  Physical Exam: BP (!) 166/82   Pulse 62   Constitutional:  Alert and oriented, No acute distress. HEENT: [place] AT, moist mucus membranes.  Trachea midline, no masses. Cardiovascular: No clubbing, cyanosis, or edema. Respiratory: Normal respiratory effort, no increased work of breathing. GI: Abdomen is soft, nontender, nondistended, no abdominal masses GU: No CVA tenderness.  Lymph: No cervical or inguinal lymphadenopathy. Skin: No rashes, bruises or suspicious lesions. Neurologic: Grossly intact, no focal deficits, moving all 4 extremities. Psychiatric: Normal mood and affect.  Laboratory Data: Lab Results  Component Value Date   WBC 10.8 (H) 05/31/2022   HGB 12.0 05/31/2022   HCT 38.1 05/31/2022   MCV 102.7 (H) 05/31/2022   PLT 194 05/31/2022    Lab Results  Component Value Date   CREATININE 1.80 (H) 05/31/2022    No results found for: "PSA"  No results found for: "TESTOSTERONE"  No results found for: "HGBA1C"  Urinalysis    Component Value Date/Time   COLORURINE YELLOW 05/31/2022 1722   APPEARANCEUR Clear 06/26/2022 1413   LABSPEC 1.023 05/31/2022 1722   PHURINE 5.0 05/31/2022 1722   GLUCOSEU Negative 06/26/2022 1413   HGBUR NEGATIVE 05/31/2022 1722   BILIRUBINUR Negative 06/26/2022 1413   KETONESUR NEGATIVE 05/31/2022 1722   PROTEINUR Negative 06/26/2022 1413   PROTEINUR 30 (A) 05/31/2022 1722   NITRITE Negative 06/26/2022 1413   NITRITE NEGATIVE 05/31/2022 1722   LEUKOCYTESUR Negative 06/26/2022 1413   LEUKOCYTESUR SMALL (A) 05/31/2022 1722    Lab Results  Component Value Date   LABMICR Comment 06/26/2022   WBCUA 0-5 02/28/2022   RBCUA 0-2 06/23/2017   LABEPIT 0-10 02/28/2022    MUCUS Present (A) 02/28/2022   BACTERIA RARE (A) 05/31/2022    Pertinent Imaging:  No results found for this or any previous visit.  No results found for this or any previous visit.  No results found for this or any previous visit.  No results found for this or any previous visit.  No results found for this or any previous visit.  No valid procedures specified. No results found for this or any previous visit.  No results found for this or any previous visit.   Assessment & Plan:    1. Acute cystitis without hematuria -resolved -See PRN - Urinalysis, Routine w reflex microscopic - BLADDER SCAN AMB NON-IMAGING   No follow-ups on file.  Nicolette Bang, MD  [facility] [place]

## 2022-08-14 NOTE — Progress Notes (Signed)
post void residual=171 

## 2022-08-15 LAB — URINALYSIS, ROUTINE W REFLEX MICROSCOPIC
Bilirubin, UA: NEGATIVE
Glucose, UA: NEGATIVE
Ketones, UA: NEGATIVE
Leukocytes,UA: NEGATIVE
Nitrite, UA: NEGATIVE
RBC, UA: NEGATIVE
Specific Gravity, UA: 1.02 (ref 1.005–1.030)
Urobilinogen, Ur: 0.2 mg/dL (ref 0.2–1.0)
pH, UA: 5.5 (ref 5.0–7.5)

## 2022-08-16 ENCOUNTER — Telehealth: Payer: Self-pay | Admitting: Allergy & Immunology

## 2022-08-16 DIAGNOSIS — Z79899 Other long term (current) drug therapy: Secondary | ICD-10-CM | POA: Diagnosis not present

## 2022-08-16 MED ORDER — EPICERAM EX EMUL: 1.0000 | Freq: Two times a day (BID) | CUTANEOUS | 3 refills | Status: DC | PRN

## 2022-08-16 NOTE — Telephone Encounter (Signed)
I called and spoke with Sharyn Lull and she stated that the Gabapentin has not been helping at all unfortunately.

## 2022-08-16 NOTE — Telephone Encounter (Signed)
We have referred her to Dermatology for a biopsy. This was placed on March 11th. Routing to [place] to see where this referral is pending.   Salvatore Marvel, MD [facility] and [place] [facility] [place]

## 2022-08-16 NOTE — Telephone Encounter (Signed)
Waiting on patient to return consent forms for PAP

## 2022-08-16 NOTE — Telephone Encounter (Signed)
Spoke with Sharyn Lull, informed her that I have sent in the 225 g tube of Epiceram to the requested pharmacy with 3 additional refills. Sharyn Lull verbalized understanding.

## 2022-08-16 NOTE — Telephone Encounter (Signed)
Sharyn Lull stated that Susan Bennett's daughter is wanting to know if we should do a biopsy on her skin? She also started that they started a medication on her on Wednesday to treat scabies that starts with a P even though they don't think she has it, but to see if it helps with itching, so far it has not been helpful. Sharyn Lull states that we can call her any time in regards to this while she is at work, she states that she will be back in the office on Monday.

## 2022-08-16 NOTE — Telephone Encounter (Signed)
Michelle from patients facility called and stated that patient is in need of refill of epi serum. [facility] pharmacy is [facility] She is requesting a 225g tube be called in to pharmacy. Call back number is [phone]

## 2022-08-16 NOTE — Telephone Encounter (Signed)
Is the gabapentin doing anything at all?   Any luck with Dupixent approval, Tammy? We could call it purigo nodularis if you need to.   Salvatore Marvel, MD All[facility] and [place] [facility] [place]

## 2022-08-19 DIAGNOSIS — N390 Urinary tract infection, site not specified: Secondary | ICD-10-CM | POA: Diagnosis not present

## 2022-08-20 DIAGNOSIS — R296 Repeated falls: Secondary | ICD-10-CM | POA: Diagnosis not present

## 2022-08-20 DIAGNOSIS — G9341 Metabolic encephalopathy: Secondary | ICD-10-CM | POA: Diagnosis not present

## 2022-08-20 DIAGNOSIS — I4891 Unspecified atrial fibrillation: Secondary | ICD-10-CM | POA: Diagnosis not present

## 2022-08-20 DIAGNOSIS — E870 Hyperosmolality and hypernatremia: Secondary | ICD-10-CM | POA: Diagnosis not present

## 2022-08-20 DIAGNOSIS — L500 Allergic urticaria: Secondary | ICD-10-CM | POA: Diagnosis not present

## 2022-08-20 DIAGNOSIS — E440 Moderate protein-calorie malnutrition: Secondary | ICD-10-CM | POA: Diagnosis not present

## 2022-08-20 DIAGNOSIS — F0393 Unspecified dementia, unspecified severity, with mood disturbance: Secondary | ICD-10-CM | POA: Diagnosis not present

## 2022-08-20 DIAGNOSIS — N189 Chronic kidney disease, unspecified: Secondary | ICD-10-CM | POA: Diagnosis not present

## 2022-08-20 DIAGNOSIS — F339 Major depressive disorder, recurrent, unspecified: Secondary | ICD-10-CM | POA: Diagnosis not present

## 2022-08-20 DIAGNOSIS — Z9181 History of falling: Secondary | ICD-10-CM | POA: Diagnosis not present

## 2022-08-20 DIAGNOSIS — I129 Hypertensive chronic kidney disease with stage 1 through stage 4 chronic kidney disease, or unspecified chronic kidney disease: Secondary | ICD-10-CM | POA: Diagnosis not present

## 2022-08-20 DIAGNOSIS — E860 Dehydration: Secondary | ICD-10-CM | POA: Diagnosis not present

## 2022-08-21 DIAGNOSIS — L308 Other specified dermatitis: Secondary | ICD-10-CM | POA: Diagnosis not present

## 2022-08-21 DIAGNOSIS — L309 Dermatitis, unspecified: Secondary | ICD-10-CM | POA: Diagnosis not present

## 2022-08-21 DIAGNOSIS — Z515 Encounter for palliative care: Secondary | ICD-10-CM | POA: Diagnosis not present

## 2022-08-21 DIAGNOSIS — L821 Other seborrheic keratosis: Secondary | ICD-10-CM | POA: Diagnosis not present

## 2022-08-21 DIAGNOSIS — F0390 Unspecified dementia without behavioral disturbance: Secondary | ICD-10-CM | POA: Diagnosis not present

## 2022-08-21 NOTE — Telephone Encounter (Signed)
I called the patient and left a message that they can call the Dermatology office and make an appointment if they did not want to wait for a call. I also left the [place] office number for the patient to call back with any questions or concerns.    DR. Anderson Malta DAVID, DERMATOLOGY [address], [place], [place] [address] [phone]

## 2022-08-23 DIAGNOSIS — G9341 Metabolic encephalopathy: Secondary | ICD-10-CM | POA: Diagnosis not present

## 2022-08-23 DIAGNOSIS — F0393 Unspecified dementia, unspecified severity, with mood disturbance: Secondary | ICD-10-CM | POA: Diagnosis not present

## 2022-08-23 DIAGNOSIS — I4891 Unspecified atrial fibrillation: Secondary | ICD-10-CM | POA: Diagnosis not present

## 2022-08-23 DIAGNOSIS — F339 Major depressive disorder, recurrent, unspecified: Secondary | ICD-10-CM | POA: Diagnosis not present

## 2022-08-23 DIAGNOSIS — L500 Allergic urticaria: Secondary | ICD-10-CM | POA: Diagnosis not present

## 2022-08-23 DIAGNOSIS — I129 Hypertensive chronic kidney disease with stage 1 through stage 4 chronic kidney disease, or unspecified chronic kidney disease: Secondary | ICD-10-CM | POA: Diagnosis not present

## 2022-08-26 ENCOUNTER — Ambulatory Visit (INDEPENDENT_AMBULATORY_CARE_PROVIDER_SITE_OTHER): Payer: Medicare Other

## 2022-08-26 DIAGNOSIS — L501 Idiopathic urticaria: Secondary | ICD-10-CM | POA: Diagnosis not present

## 2022-08-27 DIAGNOSIS — G894 Chronic pain syndrome: Secondary | ICD-10-CM | POA: Diagnosis not present

## 2022-08-27 DIAGNOSIS — I129 Hypertensive chronic kidney disease with stage 1 through stage 4 chronic kidney disease, or unspecified chronic kidney disease: Secondary | ICD-10-CM | POA: Diagnosis not present

## 2022-08-27 DIAGNOSIS — M6281 Muscle weakness (generalized): Secondary | ICD-10-CM | POA: Diagnosis not present

## 2022-08-28 DIAGNOSIS — F339 Major depressive disorder, recurrent, unspecified: Secondary | ICD-10-CM | POA: Diagnosis not present

## 2022-08-28 DIAGNOSIS — L500 Allergic urticaria: Secondary | ICD-10-CM | POA: Diagnosis not present

## 2022-08-28 DIAGNOSIS — Z79899 Other long term (current) drug therapy: Secondary | ICD-10-CM | POA: Diagnosis not present

## 2022-08-28 DIAGNOSIS — G9341 Metabolic encephalopathy: Secondary | ICD-10-CM | POA: Diagnosis not present

## 2022-08-28 DIAGNOSIS — I129 Hypertensive chronic kidney disease with stage 1 through stage 4 chronic kidney disease, or unspecified chronic kidney disease: Secondary | ICD-10-CM | POA: Diagnosis not present

## 2022-08-28 DIAGNOSIS — F0393 Unspecified dementia, unspecified severity, with mood disturbance: Secondary | ICD-10-CM | POA: Diagnosis not present

## 2022-08-28 DIAGNOSIS — I4891 Unspecified atrial fibrillation: Secondary | ICD-10-CM | POA: Diagnosis not present

## 2022-08-29 DIAGNOSIS — F4323 Adjustment disorder with mixed anxiety and depressed mood: Secondary | ICD-10-CM | POA: Diagnosis not present

## 2022-09-02 DIAGNOSIS — I129 Hypertensive chronic kidney disease with stage 1 through stage 4 chronic kidney disease, or unspecified chronic kidney disease: Secondary | ICD-10-CM | POA: Diagnosis not present

## 2022-09-02 DIAGNOSIS — G9341 Metabolic encephalopathy: Secondary | ICD-10-CM | POA: Diagnosis not present

## 2022-09-02 DIAGNOSIS — F0393 Unspecified dementia, unspecified severity, with mood disturbance: Secondary | ICD-10-CM | POA: Diagnosis not present

## 2022-09-02 DIAGNOSIS — F339 Major depressive disorder, recurrent, unspecified: Secondary | ICD-10-CM | POA: Diagnosis not present

## 2022-09-02 DIAGNOSIS — L500 Allergic urticaria: Secondary | ICD-10-CM | POA: Diagnosis not present

## 2022-09-02 DIAGNOSIS — I4891 Unspecified atrial fibrillation: Secondary | ICD-10-CM | POA: Diagnosis not present

## 2022-09-03 DIAGNOSIS — F4323 Adjustment disorder with mixed anxiety and depressed mood: Secondary | ICD-10-CM | POA: Diagnosis not present

## 2022-09-04 DIAGNOSIS — I129 Hypertensive chronic kidney disease with stage 1 through stage 4 chronic kidney disease, or unspecified chronic kidney disease: Secondary | ICD-10-CM | POA: Diagnosis not present

## 2022-09-04 DIAGNOSIS — F0393 Unspecified dementia, unspecified severity, with mood disturbance: Secondary | ICD-10-CM | POA: Diagnosis not present

## 2022-09-04 DIAGNOSIS — G9341 Metabolic encephalopathy: Secondary | ICD-10-CM | POA: Diagnosis not present

## 2022-09-04 DIAGNOSIS — I4891 Unspecified atrial fibrillation: Secondary | ICD-10-CM | POA: Diagnosis not present

## 2022-09-04 DIAGNOSIS — L500 Allergic urticaria: Secondary | ICD-10-CM | POA: Diagnosis not present

## 2022-09-04 DIAGNOSIS — F339 Major depressive disorder, recurrent, unspecified: Secondary | ICD-10-CM | POA: Diagnosis not present

## 2022-09-05 DIAGNOSIS — F0390 Unspecified dementia without behavioral disturbance: Secondary | ICD-10-CM | POA: Diagnosis not present

## 2022-09-05 DIAGNOSIS — F4323 Adjustment disorder with mixed anxiety and depressed mood: Secondary | ICD-10-CM | POA: Diagnosis not present

## 2022-09-05 DIAGNOSIS — F064 Anxiety disorder due to known physiological condition: Secondary | ICD-10-CM | POA: Diagnosis not present

## 2022-09-06 ENCOUNTER — Telehealth: Payer: Self-pay

## 2022-09-06 DIAGNOSIS — M6281 Muscle weakness (generalized): Secondary | ICD-10-CM | POA: Diagnosis not present

## 2022-09-06 DIAGNOSIS — I10 Essential (primary) hypertension: Secondary | ICD-10-CM | POA: Diagnosis not present

## 2022-09-06 DIAGNOSIS — I129 Hypertensive chronic kidney disease with stage 1 through stage 4 chronic kidney disease, or unspecified chronic kidney disease: Secondary | ICD-10-CM | POA: Diagnosis not present

## 2022-09-06 DIAGNOSIS — K5900 Constipation, unspecified: Secondary | ICD-10-CM | POA: Diagnosis not present

## 2022-09-06 DIAGNOSIS — J302 Other seasonal allergic rhinitis: Secondary | ICD-10-CM | POA: Diagnosis not present

## 2022-09-06 NOTE — Telephone Encounter (Signed)
Called and spoke to patients nurse Marcelino Duster and she expressed that while reading the label there were no other refills left and that when she called [facility] she was informed that there were no other refills left. I did call [facility] and spoke to [place] and was informed that there are indeed refills on the 225 gram  medication however it is too soon for the refills as the medication was filled on August 22, 2022 and the automatic refill for April will be on the 21. Marcelino Duster stated that she called and spoke to a person named Thayer Ohm who stated that for the previous prescription for the 90 gram tube will be eligible for refills on the 11th of April. Nurse stated that she would call on the 11th and on the 21 for the refills.

## 2022-09-06 NOTE — Telephone Encounter (Signed)
Received a call from [facility] requesting a direction change for the [facility]. Nurse states she is using it daily and has run out. Also when they called to request a refill, they were told the prescription was out of refills. We did send it in with refills, so I am not sure what happened.   Please Advise.Leonie Green [phone]

## 2022-09-06 NOTE — Telephone Encounter (Signed)
Thanks, Cree!   Franca Stakes, MD [facility] [place]  

## 2022-09-09 DIAGNOSIS — L500 Allergic urticaria: Secondary | ICD-10-CM | POA: Diagnosis not present

## 2022-09-09 DIAGNOSIS — I4891 Unspecified atrial fibrillation: Secondary | ICD-10-CM | POA: Diagnosis not present

## 2022-09-09 DIAGNOSIS — F339 Major depressive disorder, recurrent, unspecified: Secondary | ICD-10-CM | POA: Diagnosis not present

## 2022-09-09 DIAGNOSIS — F0393 Unspecified dementia, unspecified severity, with mood disturbance: Secondary | ICD-10-CM | POA: Diagnosis not present

## 2022-09-09 DIAGNOSIS — I129 Hypertensive chronic kidney disease with stage 1 through stage 4 chronic kidney disease, or unspecified chronic kidney disease: Secondary | ICD-10-CM | POA: Diagnosis not present

## 2022-09-09 DIAGNOSIS — G9341 Metabolic encephalopathy: Secondary | ICD-10-CM | POA: Diagnosis not present

## 2022-09-10 DIAGNOSIS — F0390 Unspecified dementia without behavioral disturbance: Secondary | ICD-10-CM | POA: Diagnosis not present

## 2022-09-10 DIAGNOSIS — L500 Allergic urticaria: Secondary | ICD-10-CM | POA: Diagnosis not present

## 2022-09-10 DIAGNOSIS — I10 Essential (primary) hypertension: Secondary | ICD-10-CM | POA: Diagnosis not present

## 2022-09-10 DIAGNOSIS — F4323 Adjustment disorder with mixed anxiety and depressed mood: Secondary | ICD-10-CM | POA: Diagnosis not present

## 2022-09-10 DIAGNOSIS — I129 Hypertensive chronic kidney disease with stage 1 through stage 4 chronic kidney disease, or unspecified chronic kidney disease: Secondary | ICD-10-CM | POA: Diagnosis not present

## 2022-09-16 DIAGNOSIS — G894 Chronic pain syndrome: Secondary | ICD-10-CM | POA: Diagnosis not present

## 2022-09-16 DIAGNOSIS — L209 Atopic dermatitis, unspecified: Secondary | ICD-10-CM | POA: Diagnosis not present

## 2022-09-16 DIAGNOSIS — F0393 Unspecified dementia, unspecified severity, with mood disturbance: Secondary | ICD-10-CM | POA: Diagnosis not present

## 2022-09-16 DIAGNOSIS — L500 Allergic urticaria: Secondary | ICD-10-CM | POA: Diagnosis not present

## 2022-09-16 DIAGNOSIS — F339 Major depressive disorder, recurrent, unspecified: Secondary | ICD-10-CM | POA: Diagnosis not present

## 2022-09-17 DIAGNOSIS — F4323 Adjustment disorder with mixed anxiety and depressed mood: Secondary | ICD-10-CM | POA: Diagnosis not present

## 2022-09-18 DIAGNOSIS — I129 Hypertensive chronic kidney disease with stage 1 through stage 4 chronic kidney disease, or unspecified chronic kidney disease: Secondary | ICD-10-CM | POA: Diagnosis not present

## 2022-09-18 DIAGNOSIS — F339 Major depressive disorder, recurrent, unspecified: Secondary | ICD-10-CM | POA: Diagnosis not present

## 2022-09-18 DIAGNOSIS — F0393 Unspecified dementia, unspecified severity, with mood disturbance: Secondary | ICD-10-CM | POA: Diagnosis not present

## 2022-09-18 DIAGNOSIS — I4891 Unspecified atrial fibrillation: Secondary | ICD-10-CM | POA: Diagnosis not present

## 2022-09-18 DIAGNOSIS — G9341 Metabolic encephalopathy: Secondary | ICD-10-CM | POA: Diagnosis not present

## 2022-09-18 DIAGNOSIS — L500 Allergic urticaria: Secondary | ICD-10-CM | POA: Diagnosis not present

## 2022-09-23 ENCOUNTER — Ambulatory Visit (INDEPENDENT_AMBULATORY_CARE_PROVIDER_SITE_OTHER): Payer: Medicare Other

## 2022-09-23 DIAGNOSIS — L501 Idiopathic urticaria: Secondary | ICD-10-CM | POA: Diagnosis not present

## 2022-09-24 ENCOUNTER — Other Ambulatory Visit: Payer: Self-pay

## 2022-09-24 ENCOUNTER — Emergency Department (HOSPITAL_COMMUNITY)
Admission: EM | Admit: 2022-09-24 | Discharge: 2022-09-24 | Payer: Medicare Other | Attending: Emergency Medicine | Admitting: Emergency Medicine

## 2022-09-24 DIAGNOSIS — R197 Diarrhea, unspecified: Secondary | ICD-10-CM | POA: Diagnosis not present

## 2022-09-24 DIAGNOSIS — I10 Essential (primary) hypertension: Secondary | ICD-10-CM | POA: Diagnosis not present

## 2022-09-24 LAB — CBC WITH DIFFERENTIAL/PLATELET
Abs Immature Granulocytes: 0 10*3/uL (ref 0.00–0.07)
Band Neutrophils: 1 %
Basophils Absolute: 0 10*3/uL (ref 0.0–0.1)
Basophils Relative: 0 %
Eosinophils Absolute: 2.1 10*3/uL — ABNORMAL HIGH (ref 0.0–0.5)
Eosinophils Relative: 18 %
HCT: 39.2 % (ref 36.0–46.0)
Hemoglobin: 12 g/dL (ref 12.0–15.0)
Lymphocytes Relative: 12 %
Lymphs Abs: 1.4 10*3/uL (ref 0.7–4.0)
MCH: 30.7 pg (ref 26.0–34.0)
MCHC: 30.6 g/dL (ref 30.0–36.0)
MCV: 100.3 fL — ABNORMAL HIGH (ref 80.0–100.0)
Monocytes Absolute: 0.4 10*3/uL (ref 0.1–1.0)
Monocytes Relative: 3 %
Neutro Abs: 8 10*3/uL — ABNORMAL HIGH (ref 1.7–7.7)
Neutrophils Relative %: 66 %
Platelets: 205 10*3/uL (ref 150–400)
RBC: 3.91 MIL/uL (ref 3.87–5.11)
RDW: 14.8 % (ref 11.5–15.5)
WBC: 11.9 10*3/uL — ABNORMAL HIGH (ref 4.0–10.5)
nRBC: 0 % (ref 0.0–0.2)

## 2022-09-24 LAB — URINALYSIS, ROUTINE W REFLEX MICROSCOPIC
Bilirubin Urine: NEGATIVE
Glucose, UA: NEGATIVE mg/dL
Hgb urine dipstick: NEGATIVE
Ketones, ur: NEGATIVE mg/dL
Leukocytes,Ua: NEGATIVE
Nitrite: NEGATIVE
Protein, ur: NEGATIVE mg/dL
Specific Gravity, Urine: 1.02 (ref 1.005–1.030)
pH: 5 (ref 5.0–8.0)

## 2022-09-24 LAB — COMPREHENSIVE METABOLIC PANEL
ALT: 15 U/L (ref 0–44)
AST: 17 U/L (ref 15–41)
Albumin: 3 g/dL — ABNORMAL LOW (ref 3.5–5.0)
Alkaline Phosphatase: 128 U/L — ABNORMAL HIGH (ref 38–126)
Anion gap: 9 (ref 5–15)
BUN: 35 mg/dL — ABNORMAL HIGH (ref 8–23)
CO2: 25 mmol/L (ref 22–32)
Calcium: 8.3 mg/dL — ABNORMAL LOW (ref 8.9–10.3)
Chloride: 109 mmol/L (ref 98–111)
Creatinine, Ser: 2.02 mg/dL — ABNORMAL HIGH (ref 0.44–1.00)
GFR, Estimated: 24 mL/min — ABNORMAL LOW (ref >60)
Glucose, Bld: 104 mg/dL — ABNORMAL HIGH (ref 70–99)
Potassium: 4.4 mmol/L (ref 3.5–5.1)
Sodium: 143 mmol/L (ref 135–145)
Total Bilirubin: 0.5 mg/dL (ref 0.3–1.2)
Total Protein: 6.1 g/dL — ABNORMAL LOW (ref 6.5–8.1)

## 2022-09-24 LAB — LIPASE, BLOOD: Lipase: 22 U/L (ref 11–51)

## 2022-09-24 LAB — MAGNESIUM: Magnesium: 2.1 mg/dL (ref 1.7–2.4)

## 2022-09-24 MED ORDER — LACTATED RINGERS IV BOLUS
500.0000 mL | Freq: Once | INTRAVENOUS | Status: AC
  Administered 2022-09-24: 500 mL via INTRAVENOUS

## 2022-09-24 NOTE — Discharge Instructions (Signed)
Your lab work today is reassuring.  Hold MiraLAX if you are having diarrhea.

## 2022-09-24 NOTE — ED Provider Notes (Signed)
[place] EMERGENCY DEPARTMENT AT [facility] Provider Note   CSN: [id number] Arrival date & time: 09/24/22  1342     History  Chief Complaint  Patient presents with   Diarrhea    Susan Bennett is a 82 y.o. female.   Diarrhea Patient presents from skilled nursing facility for 2 episodes of diarrhea today.  Facility denies presence of blood.  Patient denies any complaints.  EMS reports that she attempted refusal but staff insisted that she come to the ED to get checked out.  Patient does not recall episodes of diarrhea.  She denies any abdominal or rectal discomfort.  Per review of nursing facility paperwork, patient does receive daily MiraLAX and did receive a dose today.     Home Medications Prior to Admission medications   Medication Sig Start Date End Date Taking? Authorizing Provider  benazepril (LOTENSIN) 40 MG tablet Take 1 tablet (40 mg total) by mouth daily. 10/18/21   Raliegh Ip, DO  cephALEXin (KEFLEX) 500 MG capsule Take 1 capsule (500 mg total) by mouth 3 (three) times daily. Patient not taking: Reported on 06/19/2022 06/01/22   Geoffery Lyons, MD  cetirizine (ZYRTEC) 10 MG tablet Take 1 tablet (10 mg total) by mouth 2 (two) times daily. 07/10/22 08/09/22  Alfonse Spruce, MD  Dermatological Products, Misc. [facility]) lotion Apply 1 Application topically 2 (two) times daily as needed. 08/16/22   Alfonse Spruce, MD  desonide (DESOWEN) 0.05 % ointment Apply 1 Application topically 2 (two) times daily. 06/19/22   Ambs, Norvel Richards, FNP  diclofenac (VOLTAREN) 50 MG EC tablet TAKE 1 TABLET BY MOUTH TWICE DAILY AS NEEDED FOR PAIN (ONLY USE IF TYLENOL NOT WORKING) Patient not taking: Reported on 06/19/2022 02/12/22   Raliegh Ip, DO  DM-APAP-CPM (CORICIDIN HBP) 10-325-2 MG TABS Take 1 tablet by mouth every 6 (six) hours as needed (Every 6-8 hours as needed). Patient not taking: Reported on 06/19/2022 08/02/21   Sonny Masters, FNP  doxepin (SINEQUAN) 10  MG capsule Take 1 capsule (10 mg total) by mouth at bedtime. 05/15/22 06/14/22  Alfonse Spruce, MD  Emollient (CERAVE EX) Apply topically 2 (two) times daily as needed.    [provider]  EPINEPHrine 0.3 mg/0.3 mL IJ SOAJ injection Inject 0.3 mg into the muscle as needed for anaphylaxis. Patient not taking: Reported on 07/31/2022 05/09/22   Sonny Masters, FNP  famotidine (PEPCID) 20 MG tablet Take 1 tablet (20 mg total) by mouth 2 (two) times daily. 05/15/22 06/14/22  Alfonse Spruce, MD  furosemide (LASIX) 20 MG tablet Take 1 tablet (20 mg total) by mouth daily. Patient taking differently: Take 10 mg by mouth daily. 06/01/22   Geoffery Lyons, MD  gabapentin (NEURONTIN) 100 MG capsule Take 2 capsules (200 mg total) by mouth at bedtime. 07/31/22 08/30/22  Alfonse Spruce, MD  lidocaine (LIDODERM) 5 % Place 1 patch onto the skin daily. Remove & Discard patch within 12 hours or as directed by MD Patient not taking: Reported on 06/19/2022 08/08/21   Raliegh Ip, DO  loratadine (CLARITIN) 10 MG tablet Take 1 tablet (10 mg total) by mouth daily. Patient not taking: Reported on 07/31/2022 06/19/22   Hetty Blend, FNP  memantine (NAMENDA) 10 MG tablet Take 1 tablet (10 mg total) by mouth 2 (two) times daily. 04/03/22   Sonny Masters, FNP  montelukast (SINGULAIR) 10 MG tablet Take 1 tablet (10 mg total) by mouth at bedtime. Patient  not taking: Reported on 07/31/2022 05/16/22   Alfonse Spruce, MD  polyethylene glycol powder [facility]) 17 GM/SCOOP powder Take 17 g by mouth once.    [provider]  PREDNISONE PO Take by mouth. LAST DAY WILL BE 06/23/2022    [provider]  sertraline (ZOLOFT) 50 MG tablet Take 1.5 tablets (75 mg total) by mouth daily. 10/03/21   Raliegh Ip, DO  triamcinolone 0.1% oint-Cerave equivalent lotion 1:1 mixture Apply topically 2 (two) times daily. 06/19/22   Hetty Blend, FNP  triamcinolone ointment (KENALOG) 0.1 %  Apply 1 Application topically 2 (two) times daily. 05/17/22   Alfonse Spruce, MD  triamcinolone ointment (KENALOG) 0.1 % Apply 1 Application topically 2 (two) times daily. 06/19/22   Hetty Blend, FNP      Allergies    Patient has no known allergies.    Review of Systems   Review of Systems  Gastrointestinal:  Positive for diarrhea.  All other systems reviewed and are negative.   Physical Exam Updated Vital Signs BP (!) 133/52 (BP Location: Left Arm)   Pulse 76   Temp 97.7 F (36.5 C) (Oral)   Resp 17   Ht 5\' 3"  (1.6 m)   Wt 48.8 kg   SpO2 98%   BMI 19.06 kg/m  Physical Exam Vitals and nursing note reviewed.  Constitutional:      General: She is not in acute distress.    Appearance: Normal appearance. She is well-developed. She is not ill-appearing, toxic-appearing or diaphoretic.  HENT:     Head: Normocephalic and atraumatic.     Right Ear: External ear normal.     Left Ear: External ear normal.     Nose: Nose normal.     Mouth/Throat:     Mouth: Mucous membranes are moist.  Eyes:     Extraocular Movements: Extraocular movements intact.     Conjunctiva/sclera: Conjunctivae normal.  Cardiovascular:     Rate and Rhythm: Normal rate and regular rhythm.  Pulmonary:     Effort: Pulmonary effort is normal. No respiratory distress.  Abdominal:     General: There is no distension.     Palpations: Abdomen is soft.     Tenderness: There is no abdominal tenderness.  Musculoskeletal:        General: No swelling. Normal range of motion.     Cervical back: Normal range of motion and neck supple.     Right lower leg: No edema.     Left lower leg: No edema.  Skin:    General: Skin is warm and dry.     Coloration: Skin is not jaundiced or pale.  Neurological:     General: No focal deficit present.     Mental Status: She is alert.  Psychiatric:        Mood and Affect: Mood normal.        Behavior: Behavior normal.     ED Results / Procedures / Treatments    Labs (all labs ordered are listed, but only abnormal results are displayed) Labs Reviewed  COMPREHENSIVE METABOLIC PANEL - Abnormal; Notable for the following components:      Result Value   Glucose, Bld 104 (*)    BUN 35 (*)    Creatinine, Ser 2.02 (*)    Calcium 8.3 (*)    Total Protein 6.1 (*)    Albumin 3.0 (*)    Alkaline Phosphatase 128 (*)    GFR, Estimated 24 (*)  All other components within normal limits  CBC WITH DIFFERENTIAL/PLATELET - Abnormal; Notable for the following components:   WBC 11.9 (*)    MCV 100.3 (*)    Neutro Abs 8.0 (*)    Eosinophils Absolute 2.1 (*)    All other components within normal limits  C DIFFICILE QUICK SCREEN W PCR REFLEX    LIPASE, BLOOD  MAGNESIUM  URINALYSIS, ROUTINE W REFLEX MICROSCOPIC    EKG None  Radiology No results found.  Procedures Procedures    Medications Ordered in ED Medications  lactated ringers bolus 500 mL (has no administration in time range)  lactated ringers bolus 500 mL (0 mLs Intravenous Stopped 09/24/22 1517)    ED Course/ Medical Decision Making/ A&P                             Medical Decision Making Amount and/or Complexity of Data Reviewed Labs: ordered.   Patient presenting from skilled nursing facility for concerns of diarrhea.  She reportedly had 2 episodes of diarrhea today.  Patient arrives via EMS.  Patient has no complaints on arrival.  On exam, she is well-appearing.  Abdomen is soft and she denies any tenderness.  Rectal area was inspected with nurse chaperone present.  There is no skin breakdown.  There is no presence of external stool.  Lab work and IV fluids were ordered.  I reviewed nursing facility paperwork and patient does receive daily MiraLAX, including today.  Lab work is unremarkable.  Patient is appropriate for discharge with instructions to decrease MiraLAX in the setting of diarrhea.        Final Clinical Impression(s) / ED Diagnoses Final diagnoses:  Diarrhea,  unspecified type    Rx / DC Orders ED Discharge Orders     None         Gloris Manchester, MD 09/24/22 [phone]

## 2022-09-24 NOTE — ED Triage Notes (Signed)
Pt arrived via [facility] from [facility] c/o having two episodes of diarrhea that started today. Per EMS, facility states they just want to get her checked out to ensure she she good. Vitals WNL, no other complaints from pt. EMS cbg 146

## 2022-09-24 NOTE — ED Notes (Signed)
Pt did not have enough stool to obtain PCR c Difficle stool specimen

## 2022-09-30 DIAGNOSIS — I119 Hypertensive heart disease without heart failure: Secondary | ICD-10-CM | POA: Diagnosis not present

## 2022-09-30 DIAGNOSIS — G894 Chronic pain syndrome: Secondary | ICD-10-CM | POA: Diagnosis not present

## 2022-09-30 DIAGNOSIS — R197 Diarrhea, unspecified: Secondary | ICD-10-CM | POA: Diagnosis not present

## 2022-09-30 DIAGNOSIS — L500 Allergic urticaria: Secondary | ICD-10-CM | POA: Diagnosis not present

## 2022-10-01 DIAGNOSIS — F0390 Unspecified dementia without behavioral disturbance: Secondary | ICD-10-CM | POA: Diagnosis not present

## 2022-10-01 DIAGNOSIS — F4323 Adjustment disorder with mixed anxiety and depressed mood: Secondary | ICD-10-CM | POA: Diagnosis not present

## 2022-10-01 DIAGNOSIS — Z515 Encounter for palliative care: Secondary | ICD-10-CM | POA: Diagnosis not present

## 2022-10-07 DIAGNOSIS — F064 Anxiety disorder due to known physiological condition: Secondary | ICD-10-CM | POA: Diagnosis not present

## 2022-10-07 DIAGNOSIS — L500 Allergic urticaria: Secondary | ICD-10-CM | POA: Diagnosis not present

## 2022-10-07 DIAGNOSIS — K5901 Slow transit constipation: Secondary | ICD-10-CM | POA: Diagnosis not present

## 2022-10-07 DIAGNOSIS — F0390 Unspecified dementia without behavioral disturbance: Secondary | ICD-10-CM | POA: Diagnosis not present

## 2022-10-07 DIAGNOSIS — L299 Pruritus, unspecified: Secondary | ICD-10-CM | POA: Diagnosis not present

## 2022-10-07 DIAGNOSIS — F339 Major depressive disorder, recurrent, unspecified: Secondary | ICD-10-CM | POA: Diagnosis not present

## 2022-10-07 DIAGNOSIS — I119 Hypertensive heart disease without heart failure: Secondary | ICD-10-CM | POA: Diagnosis not present

## 2022-10-08 DIAGNOSIS — M79675 Pain in left toe(s): Secondary | ICD-10-CM | POA: Diagnosis not present

## 2022-10-08 DIAGNOSIS — M79674 Pain in right toe(s): Secondary | ICD-10-CM | POA: Diagnosis not present

## 2022-10-08 DIAGNOSIS — F0393 Unspecified dementia, unspecified severity, with mood disturbance: Secondary | ICD-10-CM | POA: Diagnosis not present

## 2022-10-08 DIAGNOSIS — F4323 Adjustment disorder with mixed anxiety and depressed mood: Secondary | ICD-10-CM | POA: Diagnosis not present

## 2022-10-08 DIAGNOSIS — F064 Anxiety disorder due to known physiological condition: Secondary | ICD-10-CM | POA: Diagnosis not present

## 2022-10-08 DIAGNOSIS — L600 Ingrowing nail: Secondary | ICD-10-CM | POA: Diagnosis not present

## 2022-10-08 DIAGNOSIS — F339 Major depressive disorder, recurrent, unspecified: Secondary | ICD-10-CM | POA: Diagnosis not present

## 2022-10-10 NOTE — Telephone Encounter (Signed)
I was finally able to get Susan Bennett scheduled to see Dr Onalee Hua on 12/31/2022. I spoke with the patients daughter and she states the patient sees [facility] and doesn't need this appointment. I have contacted that office to cx her appointment.   Thanks

## 2022-10-10 NOTE — Telephone Encounter (Signed)
I met they will probably want a second opinion, but we will see what Amber says.

## 2022-10-15 DIAGNOSIS — F339 Major depressive disorder, recurrent, unspecified: Secondary | ICD-10-CM | POA: Diagnosis not present

## 2022-10-21 DIAGNOSIS — F4323 Adjustment disorder with mixed anxiety and depressed mood: Secondary | ICD-10-CM | POA: Diagnosis not present

## 2022-10-21 DIAGNOSIS — F339 Major depressive disorder, recurrent, unspecified: Secondary | ICD-10-CM | POA: Diagnosis not present

## 2022-10-21 DIAGNOSIS — L500 Allergic urticaria: Secondary | ICD-10-CM | POA: Diagnosis not present

## 2022-10-21 DIAGNOSIS — L03031 Cellulitis of right toe: Secondary | ICD-10-CM | POA: Diagnosis not present

## 2022-10-29 DIAGNOSIS — F339 Major depressive disorder, recurrent, unspecified: Secondary | ICD-10-CM | POA: Diagnosis not present

## 2022-10-29 DIAGNOSIS — F0390 Unspecified dementia without behavioral disturbance: Secondary | ICD-10-CM | POA: Diagnosis not present

## 2022-10-29 DIAGNOSIS — G894 Chronic pain syndrome: Secondary | ICD-10-CM | POA: Diagnosis not present

## 2022-10-29 DIAGNOSIS — I119 Hypertensive heart disease without heart failure: Secondary | ICD-10-CM | POA: Diagnosis not present

## 2022-10-30 ENCOUNTER — Telehealth: Payer: Self-pay

## 2022-10-30 ENCOUNTER — Ambulatory Visit (INDEPENDENT_AMBULATORY_CARE_PROVIDER_SITE_OTHER): Payer: Medicare Other | Admitting: Allergy & Immunology

## 2022-10-30 ENCOUNTER — Ambulatory Visit: Payer: Medicare Other

## 2022-10-30 ENCOUNTER — Other Ambulatory Visit: Payer: Self-pay

## 2022-10-30 ENCOUNTER — Encounter: Payer: Self-pay | Admitting: Allergy & Immunology

## 2022-10-30 VITALS — BP 114/64 | HR 62 | Temp 98.0°F | Resp 16 | Wt 93.2 lb

## 2022-10-30 DIAGNOSIS — L299 Pruritus, unspecified: Secondary | ICD-10-CM

## 2022-10-30 DIAGNOSIS — L501 Idiopathic urticaria: Secondary | ICD-10-CM

## 2022-10-30 DIAGNOSIS — L2089 Other atopic dermatitis: Secondary | ICD-10-CM | POA: Diagnosis not present

## 2022-10-30 NOTE — Progress Notes (Signed)
FOLLOW UP  Date of Service/Encounter:  10/30/22   Assessment:   Chronic urticaria - with negative skin testing (environmental plus most common foods), now on Xolair monthly   Alpha-gal syndrome   Elevated IgE   Elevated eosinophils (800)   Biopsy consistent with eczema - seen by PA [facility]    Susan Bennett unfortunately continues to have a lot of itching despite her best efforts.  She has a biopsy consistent with eczema, although I do not see that in our system.  This makes me think that addition of Dupixent will be very useful for her.  We have tried to reach out to her daughter to get her to sign consent to get this process started, but we have been unsuccessful.  We did print off the form for Susan Bennett to take to her daughter the next time they see each other.  I think the Dupixent will be very helpful with helping with her itching.    Plan/Recommendations:   Urticaria with overlying atopic dermatitis - Continue with the Zyrtec 10mg  twice daily - Continue with gabapentin to 200mg  nightly.  - For stubborn red, itchy areas on your face, begin desonide 0.05% ointment up to twice a day as needed. Do not use this medication longer than 2 weeks in a row. - For stubborn red, itchy areas below your face apply triamcinolone 0.1% ointment up to twice a day as needed. Do not use this medication longer than 2 weeks in a row. - Continue Aveeno three times daily as needed to calm the itching (hopefully this comes in).  - We are going to continue to work on [employer].  - Since the biopsy was consistent with eczema, Dupixent might be easier to approve.   2. Return in about 6 months (around 05/02/2023).    Subjective:   Susan Bennett is a 82 y.o. female presenting today for follow up of  Chief Complaint  Patient presents with   Urticaria    No issues    Pruritus    Constant itching with no relief - went to derm for a biopsy and was diagnosed with eczema    Eczema    No  issues     Susan Bennett has a history of the following: Patient Active Problem List   Diagnosis Date Noted   Idiopathic urticaria 06/19/2022   Bilateral lower extremity edema 06/19/2022   Protrusion of lumbar intervertebral disc 11/15/2021   Alzheimer's dementia without behavioral disturbance (HCC) 12/02/2016   Vitamin B12 deficiency 12/02/2016   Essential hypertension 04/19/2016   Mild episode of recurrent major depressive disorder (HCC) 04/19/2016    History obtained from: chart review and patient and transportation aide from her nursing home .  Susan Bennett is a 82 y.o. female presenting for a follow up visit.  We last saw her in February 2024.  At that time, we continue with Zyrtec 10 mg twice daily as well as gabapentin which we increased to 100 mg nightly.  We continued her on her emollients including desonide, triamcinolone, and Episeram.  We talked about changing her to Dupixent and that was Xolair.  Since last visit, she has done well. It is somewhat better. She never did start the Dupixent. The aide is not sure what the deal is with that.  I did talk to Tammy who said that she mailed the form to Susan Bennett's daughter.  She also needs a copy of her Medicare part D insurance card, which was never  returned to her.  She did have a Dermatologist and she was diagnosed with a "little bit of eczema".  I do not see the results of the biopsy scanned into our system.  She is using all of the creams with minimal improvement.  She also has changed to Aveeno eczema which she applies 3 times a day.  Otherwise, there have been no changes to her past medical history, surgical history, family history, or social history.    Review of Systems  Constitutional: Negative.  Negative for chills, fever, malaise/fatigue and weight loss.  HENT: Negative.  Negative for congestion, ear discharge and ear pain.   Eyes:  Negative for pain, discharge and redness.  Respiratory:  Negative for cough, sputum production,  shortness of breath and wheezing.   Cardiovascular: Negative.  Negative for chest pain and palpitations.  Gastrointestinal:  Negative for abdominal pain, constipation, diarrhea, heartburn, nausea and vomiting.  Skin:  Positive for itching and rash.  Neurological:  Negative for dizziness and headaches.  Endo/Heme/Allergies:  Negative for environmental allergies. Does not bruise/bleed easily.       Objective:   Blood pressure 114/64, pulse 62, temperature 98 F (36.7 C), resp. rate 16, weight 93 lb 3.2 oz (42.3 kg), SpO2 99 %. Body mass index is 16.51 kg/m.    Physical Exam Vitals reviewed.  Constitutional:      Appearance: She is well-developed.     Comments: Mostly responsive to questions, although she is hard of hearing.   HENT:     Head: Normocephalic and atraumatic.     Right Ear: Tympanic membrane, ear canal and external ear normal. No drainage, swelling or tenderness. Tympanic membrane is not injected, scarred, erythematous, retracted or bulging.     Left Ear: Tympanic membrane, ear canal and external ear normal. No drainage, swelling or tenderness. Tympanic membrane is not injected, scarred, erythematous, retracted or bulging.     Nose: No nasal deformity, septal deviation, mucosal edema or rhinorrhea.     Right Turbinates: Enlarged. Not swollen or pale.     Left Turbinates: Enlarged. Not swollen or pale.     Right Sinus: No maxillary sinus tenderness or frontal sinus tenderness.     Left Sinus: No maxillary sinus tenderness or frontal sinus tenderness.     Mouth/Throat:     Mouth: Mucous membranes are not pale and not dry.     Pharynx: Uvula midline.  Eyes:     General:        Right eye: No discharge.        Left eye: No discharge.     Conjunctiva/sclera: Conjunctivae normal.     Right eye: Right conjunctiva is not injected. No chemosis.    Left eye: Left conjunctiva is not injected. No chemosis.    Pupils: Pupils are equal, round, and reactive to light.   Cardiovascular:     Rate and Rhythm: Normal rate and regular rhythm.     Heart sounds: Normal heart sounds.  Pulmonary:     Effort: Pulmonary effort is normal. No tachypnea, accessory muscle usage or respiratory distress.     Breath sounds: Normal breath sounds. No wheezing, rhonchi or rales.  Chest:     Chest wall: No tenderness.  Abdominal:     Tenderness: There is no abdominal tenderness. There is no guarding or rebound.  Lymphadenopathy:     Head:     Right side of head: No submandibular, tonsillar or occipital adenopathy.     Left side of head: No  submandibular, tonsillar or occipital adenopathy.     Cervical: No cervical adenopathy.  Skin:    General: Skin is warm.     Capillary Refill: Capillary refill takes less than 2 seconds.     Coloration: Skin is not pale.     Findings: Rash present. No abrasion, erythema or petechiae. Rash is urticarial. Rash is not papular or vesicular.     Comments: Overall, she looks a little bit better from a skin perspective.  She still has very thin skin with some excoriations, but the erythema is certainly much improved.  Neurological:     Mental Status: She is alert.  Psychiatric:        Behavior: Behavior is cooperative.      Diagnostic studies: none      Malachi Bonds, MD  [facility]

## 2022-10-30 NOTE — Telephone Encounter (Signed)
Patient was provided Dupixent forms for her daughter fill out. Forms were sent to the daughter's home to fill out, but was never returned    [address] [place], [address] - Daughter's Home address.

## 2022-10-30 NOTE — Patient Instructions (Addendum)
Urticaria with overlying atopic dermatitis - Continue with the Zyrtec 10mg  twice daily - Continue with gabapentin to 200mg  nightly.  - For stubborn red, itchy areas on your face, begin desonide 0.05% ointment up to twice a day as needed. Do not use this medication longer than 2 weeks in a row. - For stubborn red, itchy areas below your face apply triamcinolone 0.1% ointment up to twice a day as needed. Do not use this medication longer than 2 weeks in a row. - Continue Aveeno three times daily as needed to calm the itching (hopefully this comes in).  - We are going to continue to work on [employer].  - Since the biopsy was consistent with eczema, Dupixent might be easier to approve.   2. Return in about 6 months (around 05/02/2023).    Please inform us of any Emergency Department visits, hospitalizations, or changes in symptoms. Call us before going to the ED for breathing or allergy symptoms since we might be able to fit you in for a sick visit. Feel free to contact us anytime with any questions, problems, or concerns.  It was a pleasure to see you again today!  Websites that have reliable patient information: 1. American Academy of Asthma, Allergy, and Immunology: www.aaaai.org 2. Food Allergy Research and Education (FARE): foodallergy.org 3. Mothers of Asthmatics: http://www.asthmacommunitynetwork.org 4. American College of Allergy, Asthma, and Immunology: www.acaai.org   COVID-19 Vaccine Information can be found at: PodExchange.nl For questions related to vaccine distribution or appointments, please email [email or web address] or call [phone].   We realize that you might be concerned about having an allergic reaction to the COVID19 vaccines. To help with that concern, WE ARE OFFERING THE COVID19 VACCINES IN OUR OFFICE! Ask the front desk for dates!     "Like" [place] on Facebook and Instagram for our latest  updates!      A healthy democracy works best when [employer] participate! Make sure you are registered to vote! If you have moved or changed any of your contact information, you will need to get this updated before voting!  In some cases, you MAY be able to register to vote online: [email or web address]

## 2022-11-01 NOTE — Telephone Encounter (Signed)
Routing to Tammy to keep her in the loop.

## 2022-11-04 ENCOUNTER — Telehealth: Payer: Self-pay | Admitting: Allergy & Immunology

## 2022-11-04 ENCOUNTER — Telehealth: Payer: Self-pay

## 2022-11-04 NOTE — Telephone Encounter (Signed)
Can someone call and schedule a 3 month appointment for the patient? Thanks!   Ok to schedule with APNP.   Malachi Bonds, MD [facility]

## 2022-11-04 NOTE — Telephone Encounter (Signed)
Called and spoke to patients daughter and she expressed that she would call [facility] and have them call to schedule her next appointment.

## 2022-11-04 NOTE — Telephone Encounter (Signed)
Physician/Healthcare Provider Visit Form from 10/30/22 has been placed to be mailed out to [facility]-  address [address] [place], [place] [address]

## 2022-11-05 DIAGNOSIS — G894 Chronic pain syndrome: Secondary | ICD-10-CM | POA: Diagnosis not present

## 2022-11-05 DIAGNOSIS — F0393 Unspecified dementia, unspecified severity, with mood disturbance: Secondary | ICD-10-CM | POA: Diagnosis not present

## 2022-11-05 DIAGNOSIS — F064 Anxiety disorder due to known physiological condition: Secondary | ICD-10-CM | POA: Diagnosis not present

## 2022-11-05 DIAGNOSIS — F339 Major depressive disorder, recurrent, unspecified: Secondary | ICD-10-CM | POA: Diagnosis not present

## 2022-11-06 ENCOUNTER — Ambulatory Visit: Payer: Medicare Other

## 2022-11-06 NOTE — Progress Notes (Signed)
Patient no longer at [facility] - L.Wilson,LPN

## 2022-11-11 DIAGNOSIS — I119 Hypertensive heart disease without heart failure: Secondary | ICD-10-CM | POA: Diagnosis not present

## 2022-11-11 DIAGNOSIS — L03115 Cellulitis of right lower limb: Secondary | ICD-10-CM | POA: Diagnosis not present

## 2022-11-11 DIAGNOSIS — K5900 Constipation, unspecified: Secondary | ICD-10-CM | POA: Diagnosis not present

## 2022-11-12 DIAGNOSIS — F339 Major depressive disorder, recurrent, unspecified: Secondary | ICD-10-CM | POA: Diagnosis not present

## 2022-11-13 NOTE — Telephone Encounter (Signed)
Called and spoke to [facility] who transports patient at [phone] to have her schedule patient a three month follow up per Dr. Ellouise Newer request. Marcelino Duster expressed that she would call back to morrow to schedule her three month follow up.

## 2022-11-14 NOTE — Telephone Encounter (Signed)
Gwenyth Bender,   Did you received the faxed documents on this patient? Marcelino Duster from [facility] is calling to follow up on her approval.   Phone: [phone]

## 2022-11-18 DIAGNOSIS — Z515 Encounter for palliative care: Secondary | ICD-10-CM | POA: Diagnosis not present

## 2022-11-18 DIAGNOSIS — M79671 Pain in right foot: Secondary | ICD-10-CM | POA: Diagnosis not present

## 2022-11-18 DIAGNOSIS — M79674 Pain in right toe(s): Secondary | ICD-10-CM | POA: Diagnosis not present

## 2022-11-18 DIAGNOSIS — L600 Ingrowing nail: Secondary | ICD-10-CM | POA: Diagnosis not present

## 2022-11-18 DIAGNOSIS — L03031 Cellulitis of right toe: Secondary | ICD-10-CM | POA: Diagnosis not present

## 2022-11-18 DIAGNOSIS — F0390 Unspecified dementia without behavioral disturbance: Secondary | ICD-10-CM | POA: Diagnosis not present

## 2022-11-19 DIAGNOSIS — F339 Major depressive disorder, recurrent, unspecified: Secondary | ICD-10-CM | POA: Diagnosis not present

## 2022-11-21 DIAGNOSIS — L97829 Non-pressure chronic ulcer of other part of left lower leg with unspecified severity: Secondary | ICD-10-CM | POA: Diagnosis not present

## 2022-11-21 DIAGNOSIS — I872 Venous insufficiency (chronic) (peripheral): Secondary | ICD-10-CM | POA: Diagnosis not present

## 2022-11-21 DIAGNOSIS — L97819 Non-pressure chronic ulcer of other part of right lower leg with unspecified severity: Secondary | ICD-10-CM | POA: Diagnosis not present

## 2022-11-21 DIAGNOSIS — R609 Edema, unspecified: Secondary | ICD-10-CM | POA: Diagnosis not present

## 2022-11-21 DIAGNOSIS — R2689 Other abnormalities of gait and mobility: Secondary | ICD-10-CM | POA: Diagnosis not present

## 2022-11-22 DIAGNOSIS — L97319 Non-pressure chronic ulcer of right ankle with unspecified severity: Secondary | ICD-10-CM | POA: Diagnosis not present

## 2022-11-22 DIAGNOSIS — F0393 Unspecified dementia, unspecified severity, with mood disturbance: Secondary | ICD-10-CM | POA: Diagnosis not present

## 2022-11-22 DIAGNOSIS — I872 Venous insufficiency (chronic) (peripheral): Secondary | ICD-10-CM | POA: Diagnosis not present

## 2022-11-22 DIAGNOSIS — K5901 Slow transit constipation: Secondary | ICD-10-CM | POA: Diagnosis not present

## 2022-11-22 DIAGNOSIS — I10 Essential (primary) hypertension: Secondary | ICD-10-CM | POA: Diagnosis not present

## 2022-11-22 DIAGNOSIS — R601 Generalized edema: Secondary | ICD-10-CM | POA: Diagnosis not present

## 2022-11-22 DIAGNOSIS — F339 Major depressive disorder, recurrent, unspecified: Secondary | ICD-10-CM | POA: Diagnosis not present

## 2022-11-22 DIAGNOSIS — L97329 Non-pressure chronic ulcer of left ankle with unspecified severity: Secondary | ICD-10-CM | POA: Diagnosis not present

## 2022-11-22 DIAGNOSIS — Z7952 Long term (current) use of systemic steroids: Secondary | ICD-10-CM | POA: Diagnosis not present

## 2022-11-22 NOTE — Telephone Encounter (Signed)
Fax app to Dupixent my way will be checking status and reach out to patient

## 2022-11-25 ENCOUNTER — Telehealth: Payer: Self-pay | Admitting: Allergy & Immunology

## 2022-11-25 ENCOUNTER — Ambulatory Visit: Payer: Medicare Other

## 2022-11-25 DIAGNOSIS — F339 Major depressive disorder, recurrent, unspecified: Secondary | ICD-10-CM | POA: Diagnosis not present

## 2022-11-25 DIAGNOSIS — L97319 Non-pressure chronic ulcer of right ankle with unspecified severity: Secondary | ICD-10-CM | POA: Diagnosis not present

## 2022-11-25 DIAGNOSIS — L97329 Non-pressure chronic ulcer of left ankle with unspecified severity: Secondary | ICD-10-CM | POA: Diagnosis not present

## 2022-11-25 DIAGNOSIS — L03116 Cellulitis of left lower limb: Secondary | ICD-10-CM | POA: Diagnosis not present

## 2022-11-25 DIAGNOSIS — F0393 Unspecified dementia, unspecified severity, with mood disturbance: Secondary | ICD-10-CM | POA: Diagnosis not present

## 2022-11-25 DIAGNOSIS — G894 Chronic pain syndrome: Secondary | ICD-10-CM | POA: Diagnosis not present

## 2022-11-25 DIAGNOSIS — I872 Venous insufficiency (chronic) (peripheral): Secondary | ICD-10-CM | POA: Diagnosis not present

## 2022-11-25 DIAGNOSIS — I10 Essential (primary) hypertension: Secondary | ICD-10-CM | POA: Diagnosis not present

## 2022-11-25 NOTE — Telephone Encounter (Signed)
Patients daughter called stating she was told Irva would be switching to Dupixent Daughter needs to verfiy this asking for a call from the nurse please advise

## 2022-11-25 NOTE — Telephone Encounter (Signed)
Called patient's daughter, Gavin Pound, - DOB/DPR verified - verified/confirmed patient switching to Dupixent.  Gavin Pound was given Ocie Bob, Automotive engineer, contact information for future reference in case she has any questions/concerns - [phone].  Gavin Pound verbalized understanding to all, no further questions.

## 2022-11-26 DIAGNOSIS — F339 Major depressive disorder, recurrent, unspecified: Secondary | ICD-10-CM | POA: Diagnosis not present

## 2022-11-27 DIAGNOSIS — L03031 Cellulitis of right toe: Secondary | ICD-10-CM | POA: Diagnosis not present

## 2022-11-27 DIAGNOSIS — M79671 Pain in right foot: Secondary | ICD-10-CM | POA: Diagnosis not present

## 2022-11-27 DIAGNOSIS — M79674 Pain in right toe(s): Secondary | ICD-10-CM | POA: Diagnosis not present

## 2022-11-27 MED ORDER — DUPIXENT 300 MG/2ML ~~LOC~~ SOSY: 300.0000 mg | PREFILLED_SYRINGE | SUBCUTANEOUS | 11 refills | Status: DC

## 2022-11-27 NOTE — Telephone Encounter (Signed)
Great - thanks Tam Tam!   Cortni Tays, MD [facility]  

## 2022-11-27 NOTE — Telephone Encounter (Signed)
Spoke to [facility] at nursing home patient had LIS so Rx was sent to [facility] and patient started therapy today with loading dose. Reviewed subsequent dosing and reorder

## 2022-11-28 DIAGNOSIS — F339 Major depressive disorder, recurrent, unspecified: Secondary | ICD-10-CM | POA: Diagnosis not present

## 2022-11-28 DIAGNOSIS — L97319 Non-pressure chronic ulcer of right ankle with unspecified severity: Secondary | ICD-10-CM | POA: Diagnosis not present

## 2022-11-28 DIAGNOSIS — L97329 Non-pressure chronic ulcer of left ankle with unspecified severity: Secondary | ICD-10-CM | POA: Diagnosis not present

## 2022-11-28 DIAGNOSIS — I872 Venous insufficiency (chronic) (peripheral): Secondary | ICD-10-CM | POA: Diagnosis not present

## 2022-11-28 DIAGNOSIS — I10 Essential (primary) hypertension: Secondary | ICD-10-CM | POA: Diagnosis not present

## 2022-11-28 DIAGNOSIS — F0393 Unspecified dementia, unspecified severity, with mood disturbance: Secondary | ICD-10-CM | POA: Diagnosis not present

## 2022-11-29 DIAGNOSIS — I10 Essential (primary) hypertension: Secondary | ICD-10-CM | POA: Diagnosis not present

## 2022-11-29 DIAGNOSIS — I872 Venous insufficiency (chronic) (peripheral): Secondary | ICD-10-CM | POA: Diagnosis not present

## 2022-11-29 DIAGNOSIS — R54 Age-related physical debility: Secondary | ICD-10-CM | POA: Diagnosis not present

## 2022-11-29 DIAGNOSIS — L03115 Cellulitis of right lower limb: Secondary | ICD-10-CM | POA: Diagnosis not present

## 2022-11-29 DIAGNOSIS — L97319 Non-pressure chronic ulcer of right ankle with unspecified severity: Secondary | ICD-10-CM | POA: Diagnosis not present

## 2022-11-29 DIAGNOSIS — K5900 Constipation, unspecified: Secondary | ICD-10-CM | POA: Diagnosis not present

## 2022-11-29 DIAGNOSIS — F339 Major depressive disorder, recurrent, unspecified: Secondary | ICD-10-CM | POA: Diagnosis not present

## 2022-11-29 DIAGNOSIS — L97329 Non-pressure chronic ulcer of left ankle with unspecified severity: Secondary | ICD-10-CM | POA: Diagnosis not present

## 2022-11-29 DIAGNOSIS — J309 Allergic rhinitis, unspecified: Secondary | ICD-10-CM | POA: Diagnosis not present

## 2022-11-29 DIAGNOSIS — G629 Polyneuropathy, unspecified: Secondary | ICD-10-CM | POA: Diagnosis not present

## 2022-11-29 DIAGNOSIS — F0393 Unspecified dementia, unspecified severity, with mood disturbance: Secondary | ICD-10-CM | POA: Diagnosis not present

## 2022-11-29 NOTE — Telephone Encounter (Signed)
I spoke with the nurse from [facility] today & she  faxed over a provider order sheet to be filled out regarding dupixent instructions. I have printed off the instructions from the patients prescription & faxed back to [facility].

## 2022-12-02 DIAGNOSIS — F0393 Unspecified dementia, unspecified severity, with mood disturbance: Secondary | ICD-10-CM | POA: Diagnosis not present

## 2022-12-02 DIAGNOSIS — F339 Major depressive disorder, recurrent, unspecified: Secondary | ICD-10-CM | POA: Diagnosis not present

## 2022-12-02 DIAGNOSIS — I10 Essential (primary) hypertension: Secondary | ICD-10-CM | POA: Diagnosis not present

## 2022-12-02 DIAGNOSIS — L97319 Non-pressure chronic ulcer of right ankle with unspecified severity: Secondary | ICD-10-CM | POA: Diagnosis not present

## 2022-12-02 DIAGNOSIS — I872 Venous insufficiency (chronic) (peripheral): Secondary | ICD-10-CM | POA: Diagnosis not present

## 2022-12-02 DIAGNOSIS — L500 Allergic urticaria: Secondary | ICD-10-CM | POA: Diagnosis not present

## 2022-12-02 DIAGNOSIS — L97329 Non-pressure chronic ulcer of left ankle with unspecified severity: Secondary | ICD-10-CM | POA: Diagnosis not present

## 2022-12-02 DIAGNOSIS — F0390 Unspecified dementia without behavioral disturbance: Secondary | ICD-10-CM | POA: Diagnosis not present

## 2022-12-02 DIAGNOSIS — I119 Hypertensive heart disease without heart failure: Secondary | ICD-10-CM | POA: Diagnosis not present

## 2022-12-03 DIAGNOSIS — F0393 Unspecified dementia, unspecified severity, with mood disturbance: Secondary | ICD-10-CM | POA: Diagnosis not present

## 2022-12-03 DIAGNOSIS — I10 Essential (primary) hypertension: Secondary | ICD-10-CM | POA: Diagnosis not present

## 2022-12-03 DIAGNOSIS — I872 Venous insufficiency (chronic) (peripheral): Secondary | ICD-10-CM | POA: Diagnosis not present

## 2022-12-03 DIAGNOSIS — L97329 Non-pressure chronic ulcer of left ankle with unspecified severity: Secondary | ICD-10-CM | POA: Diagnosis not present

## 2022-12-03 DIAGNOSIS — F339 Major depressive disorder, recurrent, unspecified: Secondary | ICD-10-CM | POA: Diagnosis not present

## 2022-12-03 DIAGNOSIS — L97319 Non-pressure chronic ulcer of right ankle with unspecified severity: Secondary | ICD-10-CM | POA: Diagnosis not present

## 2022-12-06 DIAGNOSIS — L97319 Non-pressure chronic ulcer of right ankle with unspecified severity: Secondary | ICD-10-CM | POA: Diagnosis not present

## 2022-12-06 DIAGNOSIS — I872 Venous insufficiency (chronic) (peripheral): Secondary | ICD-10-CM | POA: Diagnosis not present

## 2022-12-06 DIAGNOSIS — F339 Major depressive disorder, recurrent, unspecified: Secondary | ICD-10-CM | POA: Diagnosis not present

## 2022-12-06 DIAGNOSIS — I10 Essential (primary) hypertension: Secondary | ICD-10-CM | POA: Diagnosis not present

## 2022-12-06 DIAGNOSIS — L97329 Non-pressure chronic ulcer of left ankle with unspecified severity: Secondary | ICD-10-CM | POA: Diagnosis not present

## 2022-12-06 DIAGNOSIS — F0393 Unspecified dementia, unspecified severity, with mood disturbance: Secondary | ICD-10-CM | POA: Diagnosis not present

## 2022-12-08 DIAGNOSIS — G894 Chronic pain syndrome: Secondary | ICD-10-CM | POA: Diagnosis not present

## 2022-12-08 DIAGNOSIS — F064 Anxiety disorder due to known physiological condition: Secondary | ICD-10-CM | POA: Diagnosis not present

## 2022-12-08 DIAGNOSIS — F0390 Unspecified dementia without behavioral disturbance: Secondary | ICD-10-CM | POA: Diagnosis not present

## 2022-12-08 DIAGNOSIS — F339 Major depressive disorder, recurrent, unspecified: Secondary | ICD-10-CM | POA: Diagnosis not present

## 2022-12-09 DIAGNOSIS — F0393 Unspecified dementia, unspecified severity, with mood disturbance: Secondary | ICD-10-CM | POA: Diagnosis not present

## 2022-12-09 DIAGNOSIS — L97329 Non-pressure chronic ulcer of left ankle with unspecified severity: Secondary | ICD-10-CM | POA: Diagnosis not present

## 2022-12-09 DIAGNOSIS — L97319 Non-pressure chronic ulcer of right ankle with unspecified severity: Secondary | ICD-10-CM | POA: Diagnosis not present

## 2022-12-09 DIAGNOSIS — I872 Venous insufficiency (chronic) (peripheral): Secondary | ICD-10-CM | POA: Diagnosis not present

## 2022-12-09 DIAGNOSIS — I10 Essential (primary) hypertension: Secondary | ICD-10-CM | POA: Diagnosis not present

## 2022-12-09 DIAGNOSIS — F339 Major depressive disorder, recurrent, unspecified: Secondary | ICD-10-CM | POA: Diagnosis not present

## 2022-12-09 DIAGNOSIS — L03031 Cellulitis of right toe: Secondary | ICD-10-CM | POA: Diagnosis not present

## 2022-12-10 DIAGNOSIS — F339 Major depressive disorder, recurrent, unspecified: Secondary | ICD-10-CM | POA: Diagnosis not present

## 2022-12-11 DIAGNOSIS — L03031 Cellulitis of right toe: Secondary | ICD-10-CM | POA: Diagnosis not present

## 2022-12-11 DIAGNOSIS — M79674 Pain in right toe(s): Secondary | ICD-10-CM | POA: Diagnosis not present

## 2022-12-11 DIAGNOSIS — L600 Ingrowing nail: Secondary | ICD-10-CM | POA: Diagnosis not present

## 2022-12-11 DIAGNOSIS — F0393 Unspecified dementia, unspecified severity, with mood disturbance: Secondary | ICD-10-CM | POA: Diagnosis not present

## 2022-12-11 DIAGNOSIS — L97329 Non-pressure chronic ulcer of left ankle with unspecified severity: Secondary | ICD-10-CM | POA: Diagnosis not present

## 2022-12-11 DIAGNOSIS — I872 Venous insufficiency (chronic) (peripheral): Secondary | ICD-10-CM | POA: Diagnosis not present

## 2022-12-11 DIAGNOSIS — F339 Major depressive disorder, recurrent, unspecified: Secondary | ICD-10-CM | POA: Diagnosis not present

## 2022-12-11 DIAGNOSIS — L97319 Non-pressure chronic ulcer of right ankle with unspecified severity: Secondary | ICD-10-CM | POA: Diagnosis not present

## 2022-12-11 DIAGNOSIS — I10 Essential (primary) hypertension: Secondary | ICD-10-CM | POA: Diagnosis not present

## 2022-12-11 DIAGNOSIS — M79671 Pain in right foot: Secondary | ICD-10-CM | POA: Diagnosis not present

## 2022-12-12 DIAGNOSIS — L97329 Non-pressure chronic ulcer of left ankle with unspecified severity: Secondary | ICD-10-CM | POA: Diagnosis not present

## 2022-12-12 DIAGNOSIS — F339 Major depressive disorder, recurrent, unspecified: Secondary | ICD-10-CM | POA: Diagnosis not present

## 2022-12-12 DIAGNOSIS — I10 Essential (primary) hypertension: Secondary | ICD-10-CM | POA: Diagnosis not present

## 2022-12-12 DIAGNOSIS — F0393 Unspecified dementia, unspecified severity, with mood disturbance: Secondary | ICD-10-CM | POA: Diagnosis not present

## 2022-12-12 DIAGNOSIS — I872 Venous insufficiency (chronic) (peripheral): Secondary | ICD-10-CM | POA: Diagnosis not present

## 2022-12-12 DIAGNOSIS — L97319 Non-pressure chronic ulcer of right ankle with unspecified severity: Secondary | ICD-10-CM | POA: Diagnosis not present

## 2022-12-17 DIAGNOSIS — I872 Venous insufficiency (chronic) (peripheral): Secondary | ICD-10-CM | POA: Diagnosis not present

## 2022-12-17 DIAGNOSIS — L97329 Non-pressure chronic ulcer of left ankle with unspecified severity: Secondary | ICD-10-CM | POA: Diagnosis not present

## 2022-12-17 DIAGNOSIS — I10 Essential (primary) hypertension: Secondary | ICD-10-CM | POA: Diagnosis not present

## 2022-12-17 DIAGNOSIS — L97319 Non-pressure chronic ulcer of right ankle with unspecified severity: Secondary | ICD-10-CM | POA: Diagnosis not present

## 2022-12-17 DIAGNOSIS — F339 Major depressive disorder, recurrent, unspecified: Secondary | ICD-10-CM | POA: Diagnosis not present

## 2022-12-17 DIAGNOSIS — F0393 Unspecified dementia, unspecified severity, with mood disturbance: Secondary | ICD-10-CM | POA: Diagnosis not present

## 2022-12-19 DIAGNOSIS — I10 Essential (primary) hypertension: Secondary | ICD-10-CM | POA: Diagnosis not present

## 2022-12-19 DIAGNOSIS — L97329 Non-pressure chronic ulcer of left ankle with unspecified severity: Secondary | ICD-10-CM | POA: Diagnosis not present

## 2022-12-19 DIAGNOSIS — F339 Major depressive disorder, recurrent, unspecified: Secondary | ICD-10-CM | POA: Diagnosis not present

## 2022-12-19 DIAGNOSIS — I872 Venous insufficiency (chronic) (peripheral): Secondary | ICD-10-CM | POA: Diagnosis not present

## 2022-12-19 DIAGNOSIS — F0393 Unspecified dementia, unspecified severity, with mood disturbance: Secondary | ICD-10-CM | POA: Diagnosis not present

## 2022-12-19 DIAGNOSIS — L97319 Non-pressure chronic ulcer of right ankle with unspecified severity: Secondary | ICD-10-CM | POA: Diagnosis not present

## 2022-12-22 DIAGNOSIS — Z7952 Long term (current) use of systemic steroids: Secondary | ICD-10-CM | POA: Diagnosis not present

## 2022-12-22 DIAGNOSIS — L97319 Non-pressure chronic ulcer of right ankle with unspecified severity: Secondary | ICD-10-CM | POA: Diagnosis not present

## 2022-12-22 DIAGNOSIS — I872 Venous insufficiency (chronic) (peripheral): Secondary | ICD-10-CM | POA: Diagnosis not present

## 2022-12-22 DIAGNOSIS — K5901 Slow transit constipation: Secondary | ICD-10-CM | POA: Diagnosis not present

## 2022-12-22 DIAGNOSIS — F0393 Unspecified dementia, unspecified severity, with mood disturbance: Secondary | ICD-10-CM | POA: Diagnosis not present

## 2022-12-22 DIAGNOSIS — I10 Essential (primary) hypertension: Secondary | ICD-10-CM | POA: Diagnosis not present

## 2022-12-22 DIAGNOSIS — L97329 Non-pressure chronic ulcer of left ankle with unspecified severity: Secondary | ICD-10-CM | POA: Diagnosis not present

## 2022-12-22 DIAGNOSIS — F339 Major depressive disorder, recurrent, unspecified: Secondary | ICD-10-CM | POA: Diagnosis not present

## 2022-12-23 ENCOUNTER — Emergency Department (HOSPITAL_COMMUNITY)
Admission: EM | Admit: 2022-12-23 | Discharge: 2022-12-24 | Disposition: A | Discharge status: Home / Self Care | Payer: Medicare Other | Attending: Emergency Medicine | Admitting: Emergency Medicine

## 2022-12-23 ENCOUNTER — Other Ambulatory Visit: Payer: Self-pay

## 2022-12-23 ENCOUNTER — Encounter (HOSPITAL_COMMUNITY): Payer: Self-pay

## 2022-12-23 DIAGNOSIS — I872 Venous insufficiency (chronic) (peripheral): Secondary | ICD-10-CM | POA: Diagnosis not present

## 2022-12-23 DIAGNOSIS — I10 Essential (primary) hypertension: Secondary | ICD-10-CM | POA: Diagnosis not present

## 2022-12-23 DIAGNOSIS — G629 Polyneuropathy, unspecified: Secondary | ICD-10-CM | POA: Diagnosis not present

## 2022-12-23 DIAGNOSIS — R627 Adult failure to thrive: Secondary | ICD-10-CM | POA: Diagnosis not present

## 2022-12-23 DIAGNOSIS — R4182 Altered mental status, unspecified: Secondary | ICD-10-CM | POA: Insufficient documentation

## 2022-12-23 DIAGNOSIS — R410 Disorientation, unspecified: Secondary | ICD-10-CM | POA: Insufficient documentation

## 2022-12-23 DIAGNOSIS — E860 Dehydration: Secondary | ICD-10-CM | POA: Insufficient documentation

## 2022-12-23 DIAGNOSIS — F0393 Unspecified dementia, unspecified severity, with mood disturbance: Secondary | ICD-10-CM | POA: Diagnosis not present

## 2022-12-23 DIAGNOSIS — R531 Weakness: Secondary | ICD-10-CM | POA: Diagnosis not present

## 2022-12-23 DIAGNOSIS — R54 Age-related physical debility: Secondary | ICD-10-CM | POA: Diagnosis not present

## 2022-12-23 DIAGNOSIS — F339 Major depressive disorder, recurrent, unspecified: Secondary | ICD-10-CM | POA: Diagnosis not present

## 2022-12-23 DIAGNOSIS — L97329 Non-pressure chronic ulcer of left ankle with unspecified severity: Secondary | ICD-10-CM | POA: Diagnosis not present

## 2022-12-23 DIAGNOSIS — L97319 Non-pressure chronic ulcer of right ankle with unspecified severity: Secondary | ICD-10-CM | POA: Diagnosis not present

## 2022-12-23 LAB — CBG MONITORING, ED: Glucose-Capillary: 119 mg/dL — ABNORMAL HIGH (ref 70–99)

## 2022-12-23 LAB — CBC
HCT: 32.4 % — ABNORMAL LOW (ref 36.0–46.0)
Hemoglobin: 10.2 g/dL — ABNORMAL LOW (ref 12.0–15.0)
MCH: 30.8 pg (ref 26.0–34.0)
MCHC: 31.5 g/dL (ref 30.0–36.0)
MCV: 97.9 fL (ref 80.0–100.0)
Platelets: 173 10*3/uL (ref 150–400)
RBC: 3.31 MIL/uL — ABNORMAL LOW (ref 3.87–5.11)
RDW: 14.2 % (ref 11.5–15.5)
WBC: 6.7 10*3/uL (ref 4.0–10.5)
nRBC: 0 % (ref 0.0–0.2)

## 2022-12-23 LAB — URINALYSIS, ROUTINE W REFLEX MICROSCOPIC
Bilirubin Urine: NEGATIVE
Glucose, UA: 50 mg/dL — AB
Hgb urine dipstick: NEGATIVE
Ketones, ur: NEGATIVE mg/dL
Leukocytes,Ua: NEGATIVE
Nitrite: NEGATIVE
Protein, ur: NEGATIVE mg/dL
Specific Gravity, Urine: 1.015 (ref 1.005–1.030)
pH: 6 (ref 5.0–8.0)

## 2022-12-23 LAB — COMPREHENSIVE METABOLIC PANEL
ALT: 15 U/L (ref 0–44)
AST: 21 U/L (ref 15–41)
Albumin: 2 g/dL — ABNORMAL LOW (ref 3.5–5.0)
Alkaline Phosphatase: 148 U/L — ABNORMAL HIGH (ref 38–126)
Anion gap: 6 (ref 5–15)
BUN: 26 mg/dL — ABNORMAL HIGH (ref 8–23)
CO2: 27 mmol/L (ref 22–32)
Calcium: 7.9 mg/dL — ABNORMAL LOW (ref 8.9–10.3)
Chloride: 103 mmol/L (ref 98–111)
Creatinine, Ser: 1.46 mg/dL — ABNORMAL HIGH (ref 0.44–1.00)
GFR, Estimated: 35 mL/min — ABNORMAL LOW (ref >60)
Glucose, Bld: 175 mg/dL — ABNORMAL HIGH (ref 70–99)
Potassium: 3.7 mmol/L (ref 3.5–5.1)
Sodium: 136 mmol/L (ref 135–145)
Total Bilirubin: 0.3 mg/dL (ref 0.3–1.2)
Total Protein: 5.2 g/dL — ABNORMAL LOW (ref 6.5–8.1)

## 2022-12-23 MED ORDER — SODIUM CHLORIDE 0.9 % IV BOLUS
1000.0000 mL | Freq: Once | INTRAVENOUS | Status: AC
  Administered 2022-12-23: 1000 mL via INTRAVENOUS

## 2022-12-23 NOTE — ED Notes (Signed)
Notified [facility] of patient needing transportation back to [place],

## 2022-12-23 NOTE — ED Triage Notes (Signed)
Facility called EMS stating pt was lethargic and not acting her normal self. Per facility pt was her normal baseline this morning until 30 minutes ago because "pt refused to swallow her fry." Facility states pt has weakness, pt states to EMS "she just feels off."

## 2022-12-23 NOTE — Discharge Instructions (Signed)
Drink plenty of fluids and follow-up with your doctor this week if any problems

## 2022-12-23 NOTE — ED Provider Notes (Signed)
[place] [facility]ERGENCY DEPARTMENT AT [facility] Provider Note   CSN: [id number] Arrival date & time: 12/23/22  1634     History  Chief Complaint  Patient presents with   Altered Mental Status    Susan Bennett is a 82 y.o. female.  Patient has severe dementia.  She was more confused than usual today.  The history is provided by a caregiver. No language interpreter was used.  Altered Mental Status Presenting symptoms: behavior changes   Severity:  Moderate Most recent episode:  Today Episode history:  Continuous Timing:  Constant Progression:  Worsening Chronicity:  Recurrent Context: dementia   Associated symptoms: no abdominal pain, no hallucinations, no headaches, no rash and no seizures        Home Medications Prior to Admission medications   Medication Sig Start Date End Date Taking? Authorizing Provider  benazepril (LOTENSIN) 40 MG tablet Take 1 tablet (40 mg total) by mouth daily. 10/18/21   Raliegh Ip, DO  cephALEXin (KEFLEX) 500 MG capsule Take 1 capsule (500 mg total) by mouth 3 (three) times daily. 06/01/22   Geoffery Lyons, MD  cetirizine (ZYRTEC) 10 MG tablet Take 1 tablet (10 mg total) by mouth 2 (two) times daily. 07/10/22 08/09/22  Alfonse Spruce, MD  Dermatological Products, Misc. [facility]) lotion Apply 1 Application topically 2 (two) times daily as needed. 08/16/22   Alfonse Spruce, MD  desonide (DESOWEN) 0.05 % ointment Apply 1 Application topically 2 (two) times daily. 06/19/22   Ambs, Norvel Richards, FNP  diclofenac (VOLTAREN) 50 MG EC tablet TAKE 1 TABLET BY MOUTH TWICE DAILY AS NEEDED FOR PAIN (ONLY USE IF TYLENOL NOT WORKING) 02/12/22   Delynn Flavin M, DO  DM-APAP-CPM (CORICIDIN HBP) 10-325-2 MG TABS Take 1 tablet by mouth every 6 (six) hours as needed (Every 6-8 hours as needed). 08/02/21   Sonny Masters, FNP  doxepin (SINEQUAN) 10 MG capsule Take 1 capsule (10 mg total) by mouth at bedtime. 05/15/22 06/14/22  Alfonse Spruce,  MD  dupilumab (DUPIXENT) 300 MG/2ML prefilled syringe Inject 300 mg into the skin every 14 (fourteen) days. 11/27/22   Alfonse Spruce, MD  Emollient [facility] EX) Apply topically 2 (two) times daily as needed.    [provider]  EPINEPHrine 0.3 mg/0.3 mL IJ SOAJ injection Inject 0.3 mg into the muscle as needed for anaphylaxis. 05/09/22   Sonny Masters, FNP  famotidine (PEPCID) 20 MG tablet Take 1 tablet (20 mg total) by mouth 2 (two) times daily. 05/15/22 06/14/22  Alfonse Spruce, MD  furosemide (LASIX) 20 MG tablet Take 1 tablet (20 mg total) by mouth daily. Patient taking differently: Take 10 mg by mouth daily. 06/01/22   Geoffery Lyons, MD  gabapentin (NEURONTIN) 100 MG capsule Take 2 capsules (200 mg total) by mouth at bedtime. 07/31/22 08/30/22  Alfonse Spruce, MD  lidocaine (LIDODERM) 5 % Place 1 patch onto the skin daily. Remove & Discard patch within 12 hours or as directed by MD 08/08/21   Raliegh Ip, DO  loratadine (CLARITIN) 10 MG tablet Take 1 tablet (10 mg total) by mouth daily. 06/19/22   Hetty Blend, FNP  memantine (NAMENDA) 10 MG tablet Take 1 tablet (10 mg total) by mouth 2 (two) times daily. 04/03/22   Sonny Masters, FNP  montelukast (SINGULAIR) 10 MG tablet Take 1 tablet (10 mg total) by mouth at bedtime. 05/16/22   Alfonse Spruce, MD  polyethylene glycol powder [facility]) 17  GM/SCOOP powder Take 17 g by mouth once.    [provider]  PREDNISONE PO Take by mouth. LAST DAY WILL BE 06/23/2022    [provider]  sertraline (ZOLOFT) 50 MG tablet Take 1.5 tablets (75 mg total) by mouth daily. 10/03/21   Raliegh Ip, DO  triamcinolone 0.1% oint-Cerave equivalent lotion 1:1 mixture Apply topically 2 (two) times daily. 06/19/22   Hetty Blend, FNP  triamcinolone ointment (KENALOG) 0.1 % Apply 1 Application topically 2 (two) times daily. 05/17/22   Alfonse Spruce, MD  triamcinolone ointment (KENALOG) 0.1 % Apply  1 Application topically 2 (two) times daily. 06/19/22   Hetty Blend, FNP      Allergies    Patient has no known allergies.    Review of Systems   Review of Systems  Constitutional:  Negative for appetite change and fatigue.  HENT:  Negative for congestion, ear discharge and sinus pressure.   Eyes:  Negative for discharge.  Respiratory:  Negative for cough.   Cardiovascular:  Negative for chest pain.  Gastrointestinal:  Negative for abdominal pain and diarrhea.  Genitourinary:  Negative for frequency and hematuria.  Musculoskeletal:  Negative for back pain.  Skin:  Negative for rash.  Neurological:  Negative for seizures and headaches.  Psychiatric/Behavioral:  Negative for hallucinations.     Physical Exam Updated Vital Signs BP 92/68 (BP Location: Left Arm)   Pulse 72   Temp 97.8 F (36.6 C) (Oral)   Resp 17   Ht 5\' 3"  (1.6 m)   Wt 43.6 kg   SpO2 96%   BMI 17.02 kg/m  Physical Exam Vitals and nursing note reviewed.  Constitutional:      Appearance: She is well-developed.  HENT:     Head: Normocephalic.     Nose: Nose normal.  Eyes:     General: No scleral icterus.    Conjunctiva/sclera: Conjunctivae normal.  Neck:     Thyroid: No thyromegaly.  Cardiovascular:     Rate and Rhythm: Normal rate and regular rhythm.     Heart sounds: No murmur heard.    No friction rub. No gallop.  Pulmonary:     Breath sounds: No stridor. No wheezing or rales.  Chest:     Chest wall: No tenderness.  Abdominal:     General: There is no distension.     Tenderness: There is no abdominal tenderness. There is no rebound.  Musculoskeletal:        General: Normal range of motion.     Cervical back: Neck supple.  Lymphadenopathy:     Cervical: No cervical adenopathy.  Skin:    Findings: No erythema or rash.  Neurological:     Mental Status: She is alert.     Motor: No abnormal muscle tone.     Coordination: Coordination normal.     Comments: Oriented to person only   Psychiatric:        Behavior: Behavior normal.     ED Results / Procedures / Treatments   Labs (all labs ordered are listed, but only abnormal results are displayed) Labs Reviewed  COMPREHENSIVE METABOLIC PANEL - Abnormal; Notable for the following components:      Result Value   Glucose, Bld 175 (*)    BUN 26 (*)    Creatinine, Ser 1.46 (*)    Calcium 7.9 (*)    Total Protein 5.2 (*)    Albumin 2.0 (*)    Alkaline Phosphatase 148 (*)  GFR, Estimated 35 (*)    All other components within normal limits  CBC - Abnormal; Notable for the following components:   RBC 3.31 (*)    Hemoglobin 10.2 (*)    HCT 32.4 (*)    All other components within normal limits  URINALYSIS, ROUTINE W REFLEX MICROSCOPIC - Abnormal; Notable for the following components:   Glucose, UA 50 (*)    All other components within normal limits  CBG MONITORING, ED - Abnormal; Notable for the following components:   Glucose-Capillary 119 (*)    All other components within normal limits    EKG None  Radiology No results found.  Procedures Procedures    Medications Ordered in ED Medications  sodium chloride 0.9 % bolus 1,000 mL (1,000 mLs Intravenous Bolus 12/23/22 1845)    ED Course/ Medical Decision Making/ A&P                             Medical Decision Making Amount and/or Complexity of Data Reviewed Labs: ordered.  Worsening dementia and mild dehydration.  Patient improved with hydration and according to her sitter she is back to normal so she will be discharged back to the care facility        Final Clinical Impression(s) / ED Diagnoses Final diagnoses:  Confusion  Dehydration    Rx / DC Orders ED Discharge Orders     None         Bethann Berkshire, MD 12/25/22 1350

## 2022-12-23 NOTE — ED Notes (Signed)
RN Armond Hang updated daughter on pt status and DC

## 2022-12-24 DIAGNOSIS — F339 Major depressive disorder, recurrent, unspecified: Secondary | ICD-10-CM | POA: Diagnosis not present

## 2022-12-24 DIAGNOSIS — F0393 Unspecified dementia, unspecified severity, with mood disturbance: Secondary | ICD-10-CM | POA: Diagnosis not present

## 2022-12-24 DIAGNOSIS — M79675 Pain in left toe(s): Secondary | ICD-10-CM | POA: Diagnosis not present

## 2022-12-24 DIAGNOSIS — I10 Essential (primary) hypertension: Secondary | ICD-10-CM | POA: Diagnosis not present

## 2022-12-24 DIAGNOSIS — L97319 Non-pressure chronic ulcer of right ankle with unspecified severity: Secondary | ICD-10-CM | POA: Diagnosis not present

## 2022-12-24 DIAGNOSIS — L600 Ingrowing nail: Secondary | ICD-10-CM | POA: Diagnosis not present

## 2022-12-24 DIAGNOSIS — L97329 Non-pressure chronic ulcer of left ankle with unspecified severity: Secondary | ICD-10-CM | POA: Diagnosis not present

## 2022-12-24 DIAGNOSIS — I872 Venous insufficiency (chronic) (peripheral): Secondary | ICD-10-CM | POA: Diagnosis not present

## 2022-12-24 DIAGNOSIS — R4182 Altered mental status, unspecified: Secondary | ICD-10-CM | POA: Diagnosis not present

## 2022-12-25 DIAGNOSIS — Z515 Encounter for palliative care: Secondary | ICD-10-CM | POA: Diagnosis not present

## 2022-12-25 DIAGNOSIS — F0390 Unspecified dementia without behavioral disturbance: Secondary | ICD-10-CM | POA: Diagnosis not present

## 2022-12-25 DIAGNOSIS — M79671 Pain in right foot: Secondary | ICD-10-CM | POA: Diagnosis not present

## 2022-12-25 DIAGNOSIS — L97319 Non-pressure chronic ulcer of right ankle with unspecified severity: Secondary | ICD-10-CM | POA: Diagnosis not present

## 2022-12-25 DIAGNOSIS — F339 Major depressive disorder, recurrent, unspecified: Secondary | ICD-10-CM | POA: Diagnosis not present

## 2022-12-25 DIAGNOSIS — I10 Essential (primary) hypertension: Secondary | ICD-10-CM | POA: Diagnosis not present

## 2022-12-25 DIAGNOSIS — L03031 Cellulitis of right toe: Secondary | ICD-10-CM | POA: Diagnosis not present

## 2022-12-25 DIAGNOSIS — M79674 Pain in right toe(s): Secondary | ICD-10-CM | POA: Diagnosis not present

## 2022-12-25 DIAGNOSIS — I872 Venous insufficiency (chronic) (peripheral): Secondary | ICD-10-CM | POA: Diagnosis not present

## 2022-12-25 DIAGNOSIS — F0393 Unspecified dementia, unspecified severity, with mood disturbance: Secondary | ICD-10-CM | POA: Diagnosis not present

## 2022-12-25 DIAGNOSIS — L97329 Non-pressure chronic ulcer of left ankle with unspecified severity: Secondary | ICD-10-CM | POA: Diagnosis not present

## 2022-12-26 DIAGNOSIS — I872 Venous insufficiency (chronic) (peripheral): Secondary | ICD-10-CM | POA: Diagnosis not present

## 2022-12-26 DIAGNOSIS — I10 Essential (primary) hypertension: Secondary | ICD-10-CM | POA: Diagnosis not present

## 2022-12-26 DIAGNOSIS — L97329 Non-pressure chronic ulcer of left ankle with unspecified severity: Secondary | ICD-10-CM | POA: Diagnosis not present

## 2022-12-26 DIAGNOSIS — F339 Major depressive disorder, recurrent, unspecified: Secondary | ICD-10-CM | POA: Diagnosis not present

## 2022-12-26 DIAGNOSIS — F0393 Unspecified dementia, unspecified severity, with mood disturbance: Secondary | ICD-10-CM | POA: Diagnosis not present

## 2022-12-26 DIAGNOSIS — L97319 Non-pressure chronic ulcer of right ankle with unspecified severity: Secondary | ICD-10-CM | POA: Diagnosis not present

## 2022-12-30 DIAGNOSIS — J309 Allergic rhinitis, unspecified: Secondary | ICD-10-CM | POA: Diagnosis not present

## 2022-12-30 DIAGNOSIS — I10 Essential (primary) hypertension: Secondary | ICD-10-CM | POA: Diagnosis not present

## 2022-12-30 DIAGNOSIS — F339 Major depressive disorder, recurrent, unspecified: Secondary | ICD-10-CM | POA: Diagnosis not present

## 2022-12-30 DIAGNOSIS — K5900 Constipation, unspecified: Secondary | ICD-10-CM | POA: Diagnosis not present

## 2022-12-31 ENCOUNTER — Ambulatory Visit: Payer: Medicare Other | Admitting: Dermatology

## 2022-12-31 DIAGNOSIS — I10 Essential (primary) hypertension: Secondary | ICD-10-CM | POA: Diagnosis not present

## 2022-12-31 DIAGNOSIS — L97319 Non-pressure chronic ulcer of right ankle with unspecified severity: Secondary | ICD-10-CM | POA: Diagnosis not present

## 2022-12-31 DIAGNOSIS — I872 Venous insufficiency (chronic) (peripheral): Secondary | ICD-10-CM | POA: Diagnosis not present

## 2022-12-31 DIAGNOSIS — L97329 Non-pressure chronic ulcer of left ankle with unspecified severity: Secondary | ICD-10-CM | POA: Diagnosis not present

## 2022-12-31 DIAGNOSIS — F339 Major depressive disorder, recurrent, unspecified: Secondary | ICD-10-CM | POA: Diagnosis not present

## 2022-12-31 DIAGNOSIS — F0393 Unspecified dementia, unspecified severity, with mood disturbance: Secondary | ICD-10-CM | POA: Diagnosis not present

## 2023-01-02 DIAGNOSIS — F339 Major depressive disorder, recurrent, unspecified: Secondary | ICD-10-CM | POA: Diagnosis not present

## 2023-01-02 DIAGNOSIS — F0393 Unspecified dementia, unspecified severity, with mood disturbance: Secondary | ICD-10-CM | POA: Diagnosis not present

## 2023-01-02 DIAGNOSIS — L97329 Non-pressure chronic ulcer of left ankle with unspecified severity: Secondary | ICD-10-CM | POA: Diagnosis not present

## 2023-01-02 DIAGNOSIS — I10 Essential (primary) hypertension: Secondary | ICD-10-CM | POA: Diagnosis not present

## 2023-01-02 DIAGNOSIS — L97319 Non-pressure chronic ulcer of right ankle with unspecified severity: Secondary | ICD-10-CM | POA: Diagnosis not present

## 2023-01-02 DIAGNOSIS — I872 Venous insufficiency (chronic) (peripheral): Secondary | ICD-10-CM | POA: Diagnosis not present

## 2023-01-06 DIAGNOSIS — I10 Essential (primary) hypertension: Secondary | ICD-10-CM | POA: Diagnosis not present

## 2023-01-06 DIAGNOSIS — F339 Major depressive disorder, recurrent, unspecified: Secondary | ICD-10-CM | POA: Diagnosis not present

## 2023-01-06 DIAGNOSIS — I872 Venous insufficiency (chronic) (peripheral): Secondary | ICD-10-CM | POA: Diagnosis not present

## 2023-01-06 DIAGNOSIS — L97319 Non-pressure chronic ulcer of right ankle with unspecified severity: Secondary | ICD-10-CM | POA: Diagnosis not present

## 2023-01-06 DIAGNOSIS — L97329 Non-pressure chronic ulcer of left ankle with unspecified severity: Secondary | ICD-10-CM | POA: Diagnosis not present

## 2023-01-06 DIAGNOSIS — F0393 Unspecified dementia, unspecified severity, with mood disturbance: Secondary | ICD-10-CM | POA: Diagnosis not present

## 2023-01-07 DIAGNOSIS — F339 Major depressive disorder, recurrent, unspecified: Secondary | ICD-10-CM | POA: Diagnosis not present

## 2023-01-08 DIAGNOSIS — J1281 Pneumonia due to SARS-associated coronavirus: Secondary | ICD-10-CM | POA: Diagnosis not present

## 2023-01-08 DIAGNOSIS — R0989 Other specified symptoms and signs involving the circulatory and respiratory systems: Secondary | ICD-10-CM | POA: Diagnosis not present

## 2023-01-13 DIAGNOSIS — U071 COVID-19: Secondary | ICD-10-CM | POA: Diagnosis not present

## 2023-01-13 DIAGNOSIS — G629 Polyneuropathy, unspecified: Secondary | ICD-10-CM | POA: Diagnosis not present

## 2023-01-14 DIAGNOSIS — I10 Essential (primary) hypertension: Secondary | ICD-10-CM | POA: Diagnosis not present

## 2023-01-14 DIAGNOSIS — F339 Major depressive disorder, recurrent, unspecified: Secondary | ICD-10-CM | POA: Diagnosis not present

## 2023-01-14 DIAGNOSIS — I872 Venous insufficiency (chronic) (peripheral): Secondary | ICD-10-CM | POA: Diagnosis not present

## 2023-01-14 DIAGNOSIS — L97329 Non-pressure chronic ulcer of left ankle with unspecified severity: Secondary | ICD-10-CM | POA: Diagnosis not present

## 2023-01-14 DIAGNOSIS — F0393 Unspecified dementia, unspecified severity, with mood disturbance: Secondary | ICD-10-CM | POA: Diagnosis not present

## 2023-01-14 DIAGNOSIS — L97319 Non-pressure chronic ulcer of right ankle with unspecified severity: Secondary | ICD-10-CM | POA: Diagnosis not present

## 2023-01-20 DIAGNOSIS — F339 Major depressive disorder, recurrent, unspecified: Secondary | ICD-10-CM | POA: Diagnosis not present

## 2023-01-20 DIAGNOSIS — I10 Essential (primary) hypertension: Secondary | ICD-10-CM | POA: Diagnosis not present

## 2023-01-21 DIAGNOSIS — G894 Chronic pain syndrome: Secondary | ICD-10-CM | POA: Diagnosis not present

## 2023-01-21 DIAGNOSIS — F064 Anxiety disorder due to known physiological condition: Secondary | ICD-10-CM | POA: Diagnosis not present

## 2023-01-21 DIAGNOSIS — F339 Major depressive disorder, recurrent, unspecified: Secondary | ICD-10-CM | POA: Diagnosis not present

## 2023-01-21 DIAGNOSIS — F0390 Unspecified dementia without behavioral disturbance: Secondary | ICD-10-CM | POA: Diagnosis not present

## 2023-01-28 DIAGNOSIS — F339 Major depressive disorder, recurrent, unspecified: Secondary | ICD-10-CM | POA: Diagnosis not present

## 2023-02-04 DIAGNOSIS — F339 Major depressive disorder, recurrent, unspecified: Secondary | ICD-10-CM | POA: Diagnosis not present

## 2023-02-04 DIAGNOSIS — F0390 Unspecified dementia without behavioral disturbance: Secondary | ICD-10-CM | POA: Diagnosis not present

## 2023-02-04 DIAGNOSIS — Z515 Encounter for palliative care: Secondary | ICD-10-CM | POA: Diagnosis not present

## 2023-02-10 DIAGNOSIS — G629 Polyneuropathy, unspecified: Secondary | ICD-10-CM | POA: Diagnosis not present

## 2023-02-11 DIAGNOSIS — F339 Major depressive disorder, recurrent, unspecified: Secondary | ICD-10-CM | POA: Diagnosis not present

## 2023-02-14 ENCOUNTER — Encounter: Payer: Self-pay | Admitting: Allergy & Immunology

## 2023-02-14 ENCOUNTER — Ambulatory Visit (INDEPENDENT_AMBULATORY_CARE_PROVIDER_SITE_OTHER): Payer: Medicare Other | Admitting: Allergy & Immunology

## 2023-02-14 VITALS — BP 106/66 | HR 84 | Wt 99.4 lb

## 2023-02-14 DIAGNOSIS — L2089 Other atopic dermatitis: Secondary | ICD-10-CM | POA: Diagnosis not present

## 2023-02-14 DIAGNOSIS — L501 Idiopathic urticaria: Secondary | ICD-10-CM | POA: Diagnosis not present

## 2023-02-14 DIAGNOSIS — L299 Pruritus, unspecified: Secondary | ICD-10-CM | POA: Diagnosis not present

## 2023-02-14 NOTE — Patient Instructions (Addendum)
Urticaria with overlying atopic dermatitis - Continue with the Zyrtec 10mg  twice daily - Continue with gabapentin to 200mg  nightly.  - For stubborn red, itchy areas on your face, begin desonide 0.05% ointment up to twice a day as needed. Do not use this medication longer than 2 weeks in a row. - For stubborn red, itchy areas below your face apply triamcinolone 0.1% ointment up to twice a day as needed. Do not use this medication longer than 2 weeks in a row. - Continue Aveeno three times daily as needed to calm the itching (hopefully this comes in).  - Continue with Dupixent every two weeks as you are doing.  - We will try to wean it next time we see you.   2. Return in about 6 months (around 08/14/2023).    Please inform us of any Emergency Department visits, hospitalizations, or changes in symptoms. Call us before going to the ED for breathing or allergy symptoms since we might be able to fit you in for a sick visit. Feel free to contact us anytime with any questions, problems, or concerns.  It was a pleasure to see you again today!  Websites that have reliable patient information: 1. American Academy of Asthma, Allergy, and Immunology: www.aaaai.org 2. Food Allergy Research and Education (FARE): foodallergy.org 3. Mothers of Asthmatics: http://www.asthmacommunitynetwork.org 4. American College of Allergy, Asthma, and Immunology: www.acaai.org   COVID-19 Vaccine Information can be found at: [email or web address] For questions related to vaccine distribution or appointments, please email [email or web address] or call [phone].   We realize that you might be concerned about having an allergic reaction to the COVID19 vaccines. To help with that concern, WE ARE OFFERING THE COVID19 VACCINES IN OUR OFFICE! Ask the front desk for dates!     "Like" [place] on Facebook and Instagram for our latest updates!      A healthy democracy  works best when [employer] participate! Make sure you are registered to vote! If you have moved or changed any of your contact information, you will need to get this updated before voting!  In some cases, you MAY be able to register to vote online: [email or web address]

## 2023-02-14 NOTE — Progress Notes (Unsigned)
FOLLOW UP  Date of Service/Encounter:  02/14/23   Assessment:   Chronic urticaria - with negative skin testing (environmental plus most common foods), now on Dupixent every two weeks   Alpha-gal syndrome   Elevated IgE   Elevated eosinophils (800)    Biopsy consistent with eczema - seen by PA Amber Register   Plan/Recommendations:   Urticaria with overlying atopic dermatitis - Continue with the Zyrtec 10mg  twice daily - Continue with gabapentin to 200mg  nightly.  - For stubborn red, itchy areas on your face, begin desonide 0.05% ointment up to twice a day as needed. Do not use this medication longer than 2 weeks in a row. - For stubborn red, itchy areas below your face apply triamcinolone 0.1% ointment up to twice a day as needed. Do not use this medication longer than 2 weeks in a row. - Continue Aveeno three times daily as needed to calm the itching (hopefully this comes in).  - Continue with Dupixent every two weeks as you are doing.  - We will try to wean it next time we see you.   2. Return in about 6 months (around 08/14/2023).   Subjective:   Susan Bennett is a 82 y.o. female presenting today for follow up of  Chief Complaint  Patient presents with   Follow-up    No concerns    Susan Bennett has a history of the following: Patient Active Problem List   Diagnosis Date Noted   Idiopathic urticaria 06/19/2022   Bilateral lower extremity edema 06/19/2022   Protrusion of lumbar intervertebral disc 11/15/2021   Alzheimer's dementia without behavioral disturbance (HCC) 12/02/2016   Vitamin B12 deficiency 12/02/2016   Essential hypertension 04/19/2016   Mild episode of recurrent major depressive disorder (HCC) 04/19/2016    History obtained from: chart review and patient.  Susan Bennett is a 82 y.o. female presenting for a follow up visit.  We last saw her in May 2024.  At that time, we continue with Zyrtec as well as gabapentin.  We also continued with desonide ointment  as needed and triamcinolone ointment as needed.  We worked on getting the Dupixent approved in place of the Xolair since the Xolair does not seem to make any difference.  She also had a biopsy consistent with eczema, so we felt this would make it easier to get approved.  Since last visit, she has done well.  She did eventually start the Dupixent and she gets it at her nursing home.  It has helped quite a bit.  She is not even using any medications for her itching at all.  Her skin is much better.  Her quality of life is much better as well.  She has not been itching consistently like she was before.  Otherwise, there have been no changes to her past medical history, surgical history, family history, or social history.    Review of systems otherwise negative other than that mentioned in the HPI.    Objective:   Blood pressure 106/66, pulse 84, weight 99 lb 6.4 oz (45.1 kg), SpO2 94%. Body mass index is 17.61 kg/m.    Physical Exam Vitals reviewed.  Constitutional:      Appearance: She is well-developed.     Comments: Mostly responsive to questions, although she is hard of hearing.   HENT:     Head: Normocephalic and atraumatic.     Right Ear: Tympanic membrane, ear canal and external ear normal. No drainage, swelling or tenderness. Tympanic  membrane is not injected, scarred, erythematous, retracted or bulging.     Left Ear: Tympanic membrane, ear canal and external ear normal. No drainage, swelling or tenderness. Tympanic membrane is not injected, scarred, erythematous, retracted or bulging.     Nose: No nasal deformity, septal deviation, mucosal edema or rhinorrhea.     Right Turbinates: Enlarged, swollen and pale.     Left Turbinates: Enlarged, swollen and pale.     Right Sinus: No maxillary sinus tenderness or frontal sinus tenderness.     Left Sinus: No maxillary sinus tenderness or frontal sinus tenderness.     Mouth/Throat:     Mouth: Mucous membranes are not pale and not dry.      Pharynx: Uvula midline.  Eyes:     General:        Right eye: No discharge.        Left eye: No discharge.     Conjunctiva/sclera: Conjunctivae normal.     Right eye: Right conjunctiva is not injected. No chemosis.    Left eye: Left conjunctiva is not injected. No chemosis.    Pupils: Pupils are equal, round, and reactive to light.  Cardiovascular:     Rate and Rhythm: Normal rate and regular rhythm.     Heart sounds: Normal heart sounds.  Pulmonary:     Effort: Pulmonary effort is normal. No tachypnea, accessory muscle usage or respiratory distress.     Breath sounds: Normal breath sounds. No wheezing, rhonchi or rales.  Chest:     Chest wall: No tenderness.  Abdominal:     Tenderness: There is no abdominal tenderness. There is no guarding or rebound.  Lymphadenopathy:     Head:     Right side of head: No submandibular, tonsillar or occipital adenopathy.     Left side of head: No submandibular, tonsillar or occipital adenopathy.     Cervical: No cervical adenopathy.  Skin:    General: Skin is warm.     Capillary Refill: Capillary refill takes less than 2 seconds.     Coloration: Skin is not pale.     Findings: No abrasion, erythema, petechiae or rash. Rash is not papular, urticarial or vesicular.  Neurological:     Mental Status: She is alert.  Psychiatric:        Behavior: Behavior is cooperative.      Diagnostic studies: none       Susan Bonds, MD  [facility]

## 2023-02-17 DIAGNOSIS — L97329 Non-pressure chronic ulcer of left ankle with unspecified severity: Secondary | ICD-10-CM | POA: Diagnosis not present

## 2023-02-17 DIAGNOSIS — I872 Venous insufficiency (chronic) (peripheral): Secondary | ICD-10-CM | POA: Diagnosis not present

## 2023-02-17 DIAGNOSIS — L97319 Non-pressure chronic ulcer of right ankle with unspecified severity: Secondary | ICD-10-CM | POA: Diagnosis not present

## 2023-02-18 DIAGNOSIS — F339 Major depressive disorder, recurrent, unspecified: Secondary | ICD-10-CM | POA: Diagnosis not present

## 2023-02-18 DIAGNOSIS — G894 Chronic pain syndrome: Secondary | ICD-10-CM | POA: Diagnosis not present

## 2023-02-18 DIAGNOSIS — F064 Anxiety disorder due to known physiological condition: Secondary | ICD-10-CM | POA: Diagnosis not present

## 2023-02-18 DIAGNOSIS — F0390 Unspecified dementia without behavioral disturbance: Secondary | ICD-10-CM | POA: Diagnosis not present

## 2023-02-19 ENCOUNTER — Encounter: Payer: Self-pay | Admitting: Allergy & Immunology

## 2023-02-25 DIAGNOSIS — F339 Major depressive disorder, recurrent, unspecified: Secondary | ICD-10-CM | POA: Diagnosis not present

## 2023-03-04 DIAGNOSIS — F339 Major depressive disorder, recurrent, unspecified: Secondary | ICD-10-CM | POA: Diagnosis not present

## 2023-03-10 DIAGNOSIS — G894 Chronic pain syndrome: Secondary | ICD-10-CM | POA: Diagnosis not present

## 2023-03-10 DIAGNOSIS — L309 Dermatitis, unspecified: Secondary | ICD-10-CM | POA: Diagnosis not present

## 2023-03-11 DIAGNOSIS — F339 Major depressive disorder, recurrent, unspecified: Secondary | ICD-10-CM | POA: Diagnosis not present

## 2023-03-11 DIAGNOSIS — M79675 Pain in left toe(s): Secondary | ICD-10-CM | POA: Diagnosis not present

## 2023-03-11 DIAGNOSIS — B351 Tinea unguium: Secondary | ICD-10-CM | POA: Diagnosis not present

## 2023-03-18 DIAGNOSIS — F0390 Unspecified dementia without behavioral disturbance: Secondary | ICD-10-CM | POA: Diagnosis not present

## 2023-03-18 DIAGNOSIS — G894 Chronic pain syndrome: Secondary | ICD-10-CM | POA: Diagnosis not present

## 2023-03-18 DIAGNOSIS — F064 Anxiety disorder due to known physiological condition: Secondary | ICD-10-CM | POA: Diagnosis not present

## 2023-03-18 DIAGNOSIS — F339 Major depressive disorder, recurrent, unspecified: Secondary | ICD-10-CM | POA: Diagnosis not present

## 2023-03-24 DIAGNOSIS — Z515 Encounter for palliative care: Secondary | ICD-10-CM | POA: Diagnosis not present

## 2023-03-24 DIAGNOSIS — F0390 Unspecified dementia without behavioral disturbance: Secondary | ICD-10-CM | POA: Diagnosis not present

## 2023-03-25 DIAGNOSIS — F339 Major depressive disorder, recurrent, unspecified: Secondary | ICD-10-CM | POA: Diagnosis not present

## 2023-03-31 DIAGNOSIS — I119 Hypertensive heart disease without heart failure: Secondary | ICD-10-CM | POA: Diagnosis not present

## 2023-03-31 DIAGNOSIS — I10 Essential (primary) hypertension: Secondary | ICD-10-CM | POA: Diagnosis not present

## 2023-03-31 DIAGNOSIS — F339 Major depressive disorder, recurrent, unspecified: Secondary | ICD-10-CM | POA: Diagnosis not present

## 2023-03-31 DIAGNOSIS — R609 Edema, unspecified: Secondary | ICD-10-CM | POA: Diagnosis not present

## 2023-03-31 DIAGNOSIS — F0390 Unspecified dementia without behavioral disturbance: Secondary | ICD-10-CM | POA: Diagnosis not present

## 2023-04-01 DIAGNOSIS — F339 Major depressive disorder, recurrent, unspecified: Secondary | ICD-10-CM | POA: Diagnosis not present

## 2023-04-04 DIAGNOSIS — J309 Allergic rhinitis, unspecified: Secondary | ICD-10-CM | POA: Diagnosis not present

## 2023-04-04 DIAGNOSIS — K5900 Constipation, unspecified: Secondary | ICD-10-CM | POA: Diagnosis not present

## 2023-04-04 DIAGNOSIS — F0390 Unspecified dementia without behavioral disturbance: Secondary | ICD-10-CM | POA: Diagnosis not present

## 2023-04-04 DIAGNOSIS — I10 Essential (primary) hypertension: Secondary | ICD-10-CM | POA: Diagnosis not present

## 2023-04-08 DIAGNOSIS — F339 Major depressive disorder, recurrent, unspecified: Secondary | ICD-10-CM | POA: Diagnosis not present

## 2023-04-15 DIAGNOSIS — F339 Major depressive disorder, recurrent, unspecified: Secondary | ICD-10-CM | POA: Diagnosis not present

## 2023-04-21 DIAGNOSIS — I10 Essential (primary) hypertension: Secondary | ICD-10-CM | POA: Diagnosis not present

## 2023-04-21 DIAGNOSIS — F339 Major depressive disorder, recurrent, unspecified: Secondary | ICD-10-CM | POA: Diagnosis not present

## 2023-04-22 DIAGNOSIS — F064 Anxiety disorder due to known physiological condition: Secondary | ICD-10-CM | POA: Diagnosis not present

## 2023-04-22 DIAGNOSIS — F0390 Unspecified dementia without behavioral disturbance: Secondary | ICD-10-CM | POA: Diagnosis not present

## 2023-04-22 DIAGNOSIS — F339 Major depressive disorder, recurrent, unspecified: Secondary | ICD-10-CM | POA: Diagnosis not present

## 2023-04-29 DIAGNOSIS — F339 Major depressive disorder, recurrent, unspecified: Secondary | ICD-10-CM | POA: Diagnosis not present

## 2023-05-06 DIAGNOSIS — F339 Major depressive disorder, recurrent, unspecified: Secondary | ICD-10-CM | POA: Diagnosis not present

## 2023-05-13 DIAGNOSIS — F339 Major depressive disorder, recurrent, unspecified: Secondary | ICD-10-CM | POA: Diagnosis not present

## 2023-05-20 DIAGNOSIS — I10 Essential (primary) hypertension: Secondary | ICD-10-CM | POA: Diagnosis not present

## 2023-05-20 DIAGNOSIS — F339 Major depressive disorder, recurrent, unspecified: Secondary | ICD-10-CM | POA: Diagnosis not present

## 2023-05-26 DIAGNOSIS — F339 Major depressive disorder, recurrent, unspecified: Secondary | ICD-10-CM | POA: Diagnosis not present

## 2023-05-29 DIAGNOSIS — F339 Major depressive disorder, recurrent, unspecified: Secondary | ICD-10-CM | POA: Diagnosis not present

## 2023-05-29 DIAGNOSIS — F064 Anxiety disorder due to known physiological condition: Secondary | ICD-10-CM | POA: Diagnosis not present

## 2023-05-29 DIAGNOSIS — F0390 Unspecified dementia without behavioral disturbance: Secondary | ICD-10-CM | POA: Diagnosis not present

## 2023-06-02 DIAGNOSIS — R0989 Other specified symptoms and signs involving the circulatory and respiratory systems: Secondary | ICD-10-CM | POA: Diagnosis not present

## 2023-06-02 DIAGNOSIS — R0602 Shortness of breath: Secondary | ICD-10-CM | POA: Diagnosis not present

## 2023-06-02 DIAGNOSIS — U071 COVID-19: Secondary | ICD-10-CM | POA: Diagnosis not present

## 2023-08-15 ENCOUNTER — Ambulatory Visit: Payer: Medicare Other | Admitting: Allergy & Immunology

## 2023-09-04 DIAGNOSIS — G459 Transient cerebral ischemic attack, unspecified: Secondary | ICD-10-CM

## 2023-09-04 DIAGNOSIS — N183 Chronic kidney disease, stage 3 unspecified: Secondary | ICD-10-CM

## 2023-09-04 HISTORY — DX: Transient cerebral ischemic attack, unspecified: G45.9

## 2023-09-04 HISTORY — DX: Chronic kidney disease, stage 3 unspecified: N18.30

## 2023-09-09 ENCOUNTER — Telehealth: Payer: Self-pay

## 2023-09-09 NOTE — Telephone Encounter (Signed)
 Copied from CRM [id number]. Topic: Clinical - Home Health Verbal Orders >> Sep 09, 2023 12:21 PM Everette C wrote: Caller/Agency: [facility]  Callback Number: [phone] Service Requested: Skilled Nursing Frequency: 1w3 Any new concerns about the patient? No

## 2023-09-09 NOTE — Telephone Encounter (Signed)
 Verbal given

## 2023-09-10 ENCOUNTER — Telehealth: Payer: Self-pay

## 2023-09-10 NOTE — Telephone Encounter (Signed)
 Copied from CRM [id number]. Topic: Clinical - Home Health Verbal Orders >> Sep 10, 2023  3:38 PM Priscille Loveless wrote: Caller/Agency: [facility] Callback Number: [phone] Service Requested: Occupational Therapy Frequency: 1 x 1 week hold 1 week 2 x for 2 wks 1x week for 1 week Any new concerns about the patient? No

## 2023-09-10 NOTE — Telephone Encounter (Signed)
 Is she still under my care?  I haven't seen her since 2023.

## 2023-09-11 NOTE — Telephone Encounter (Signed)
 Spoke with patients daughter who confirmed that patient is no longer a patient of Dr Jeannett Senior and stated that she lives at an Assisted Living Home and has a doctor there, so she doesn't know why Home Health requested verbal orders from [place] but confirmed that we could disregard this request.

## 2023-09-30 ENCOUNTER — Other Ambulatory Visit: Payer: Self-pay

## 2023-09-30 ENCOUNTER — Encounter (HOSPITAL_COMMUNITY): Payer: Self-pay

## 2023-09-30 ENCOUNTER — Inpatient Hospital Stay (HOSPITAL_COMMUNITY)
Admission: EM | Admit: 2023-09-30 | Discharge: 2023-10-08 | DRG: 378 | Disposition: A | Discharge status: Skilled Nursing Facility | Attending: Internal Medicine | Admitting: Internal Medicine

## 2023-09-30 DIAGNOSIS — K5731 Diverticulosis of large intestine without perforation or abscess with bleeding: Principal | ICD-10-CM | POA: Diagnosis present

## 2023-09-30 DIAGNOSIS — E785 Hyperlipidemia, unspecified: Secondary | ICD-10-CM

## 2023-09-30 DIAGNOSIS — Z8673 Personal history of transient ischemic attack (TIA), and cerebral infarction without residual deficits: Secondary | ICD-10-CM

## 2023-09-30 DIAGNOSIS — F0284 Dementia in other diseases classified elsewhere, unspecified severity, with anxiety: Secondary | ICD-10-CM | POA: Diagnosis present

## 2023-09-30 DIAGNOSIS — G300 Alzheimer's disease with early onset: Secondary | ICD-10-CM | POA: Diagnosis present

## 2023-09-30 DIAGNOSIS — I129 Hypertensive chronic kidney disease with stage 1 through stage 4 chronic kidney disease, or unspecified chronic kidney disease: Secondary | ICD-10-CM | POA: Diagnosis present

## 2023-09-30 DIAGNOSIS — N179 Acute kidney failure, unspecified: Secondary | ICD-10-CM | POA: Diagnosis present

## 2023-09-30 DIAGNOSIS — R680 Hypothermia, not associated with low environmental temperature: Secondary | ICD-10-CM | POA: Diagnosis not present

## 2023-09-30 DIAGNOSIS — F411 Generalized anxiety disorder: Secondary | ICD-10-CM | POA: Diagnosis present

## 2023-09-30 DIAGNOSIS — R1319 Other dysphagia: Secondary | ICD-10-CM

## 2023-09-30 DIAGNOSIS — K6389 Other specified diseases of intestine: Secondary | ICD-10-CM | POA: Diagnosis present

## 2023-09-30 DIAGNOSIS — D631 Anemia in chronic kidney disease: Secondary | ICD-10-CM | POA: Diagnosis present

## 2023-09-30 DIAGNOSIS — Z79899 Other long term (current) drug therapy: Secondary | ICD-10-CM

## 2023-09-30 DIAGNOSIS — K449 Diaphragmatic hernia without obstruction or gangrene: Secondary | ICD-10-CM | POA: Diagnosis present

## 2023-09-30 DIAGNOSIS — K648 Other hemorrhoids: Secondary | ICD-10-CM | POA: Diagnosis present

## 2023-09-30 DIAGNOSIS — F0283 Dementia in other diseases classified elsewhere, unspecified severity, with mood disturbance: Secondary | ICD-10-CM | POA: Diagnosis present

## 2023-09-30 DIAGNOSIS — K921 Melena: Secondary | ICD-10-CM | POA: Diagnosis not present

## 2023-09-30 DIAGNOSIS — K625 Hemorrhage of anus and rectum: Principal | ICD-10-CM

## 2023-09-30 DIAGNOSIS — M810 Age-related osteoporosis without current pathological fracture: Secondary | ICD-10-CM | POA: Diagnosis present

## 2023-09-30 DIAGNOSIS — F32A Depression, unspecified: Secondary | ICD-10-CM | POA: Diagnosis present

## 2023-09-30 DIAGNOSIS — Z7902 Long term (current) use of antithrombotics/antiplatelets: Secondary | ICD-10-CM

## 2023-09-30 DIAGNOSIS — K5520 Angiodysplasia of colon without hemorrhage: Secondary | ICD-10-CM | POA: Diagnosis present

## 2023-09-30 DIAGNOSIS — N1832 Chronic kidney disease, stage 3b: Secondary | ICD-10-CM | POA: Diagnosis present

## 2023-09-30 DIAGNOSIS — Z8249 Family history of ischemic heart disease and other diseases of the circulatory system: Secondary | ICD-10-CM

## 2023-09-30 DIAGNOSIS — K222 Esophageal obstruction: Secondary | ICD-10-CM | POA: Diagnosis present

## 2023-09-30 DIAGNOSIS — R1314 Dysphagia, pharyngoesophageal phase: Secondary | ICD-10-CM | POA: Diagnosis present

## 2023-09-30 DIAGNOSIS — Z823 Family history of stroke: Secondary | ICD-10-CM

## 2023-09-30 DIAGNOSIS — Z808 Family history of malignant neoplasm of other organs or systems: Secondary | ICD-10-CM

## 2023-09-30 DIAGNOSIS — D120 Benign neoplasm of cecum: Secondary | ICD-10-CM | POA: Diagnosis present

## 2023-09-30 DIAGNOSIS — R001 Bradycardia, unspecified: Secondary | ICD-10-CM | POA: Diagnosis not present

## 2023-09-30 LAB — COMPREHENSIVE METABOLIC PANEL WITH GFR
ALT: 13 U/L (ref 0–44)
AST: 12 U/L — ABNORMAL LOW (ref 15–41)
Albumin: 2.8 g/dL — ABNORMAL LOW (ref 3.5–5.0)
Alkaline Phosphatase: 135 U/L — ABNORMAL HIGH (ref 38–126)
Anion gap: 5 (ref 5–15)
BUN: 23 mg/dL (ref 8–23)
CO2: 22 mmol/L (ref 22–32)
Calcium: 8.5 mg/dL — ABNORMAL LOW (ref 8.9–10.3)
Chloride: 113 mmol/L — ABNORMAL HIGH (ref 98–111)
Creatinine, Ser: 1.52 mg/dL — ABNORMAL HIGH (ref 0.44–1.00)
GFR, Estimated: 34 mL/min — ABNORMAL LOW (ref >60)
Glucose, Bld: 146 mg/dL — ABNORMAL HIGH (ref 70–99)
Potassium: 4 mmol/L (ref 3.5–5.1)
Sodium: 140 mmol/L (ref 135–145)
Total Bilirubin: <0.2 mg/dL (ref 0.0–1.2)
Total Protein: 6 g/dL — ABNORMAL LOW (ref 6.5–8.1)

## 2023-09-30 LAB — HEMOCCULT GUIAC POC 1CARD (OFFICE): Fecal Occult Blood, POC: POSITIVE — AB

## 2023-09-30 LAB — CBC WITH DIFFERENTIAL/PLATELET
Abs Immature Granulocytes: 0.07 10*3/uL (ref 0.00–0.07)
Basophils Absolute: 0 10*3/uL (ref 0.0–0.1)
Basophils Relative: 1 %
Eosinophils Absolute: 0.5 10*3/uL (ref 0.0–0.5)
Eosinophils Relative: 9 %
HCT: 34.4 % — ABNORMAL LOW (ref 36.0–46.0)
Hemoglobin: 10.6 g/dL — ABNORMAL LOW (ref 12.0–15.0)
Immature Granulocytes: 1 %
Lymphocytes Relative: 13 %
Lymphs Abs: 0.8 10*3/uL (ref 0.7–4.0)
MCH: 30.7 pg (ref 26.0–34.0)
MCHC: 30.8 g/dL (ref 30.0–36.0)
MCV: 99.7 fL (ref 80.0–100.0)
Monocytes Absolute: 0.5 10*3/uL (ref 0.1–1.0)
Monocytes Relative: 9 %
Neutro Abs: 4.3 10*3/uL (ref 1.7–7.7)
Neutrophils Relative %: 67 %
Platelets: 219 10*3/uL (ref 150–400)
RBC: 3.45 MIL/uL — ABNORMAL LOW (ref 3.87–5.11)
RDW: 13.6 % (ref 11.5–15.5)
WBC: 6.3 10*3/uL (ref 4.0–10.5)
nRBC: 0 % (ref 0.0–0.2)

## 2023-09-30 LAB — TYPE AND SCREEN
ABO/RH(D): O POS
Antibody Screen: NEGATIVE

## 2023-09-30 MED ORDER — ENOXAPARIN SODIUM 30 MG/0.3ML IJ SOSY
30.0000 mg | PREFILLED_SYRINGE | INTRAMUSCULAR | Status: DC
  Administered 2023-10-01 – 2023-10-02 (×2): 30 mg via SUBCUTANEOUS
  Filled 2023-09-30 (×2): qty 0.3

## 2023-09-30 MED ORDER — OXYCODONE HCL 5 MG PO TABS: 5.0000 mg | ORAL_TABLET | ORAL | Status: DC | PRN

## 2023-09-30 MED ORDER — ONDANSETRON HCL 4 MG/2ML IJ SOLN: 4.0000 mg | Freq: Four times a day (QID) | INTRAMUSCULAR | Status: DC | PRN

## 2023-09-30 MED ORDER — ACETAMINOPHEN 325 MG PO TABS: 650.0000 mg | ORAL_TABLET | Freq: Four times a day (QID) | ORAL | Status: DC | PRN

## 2023-09-30 MED ORDER — ONDANSETRON HCL 4 MG PO TABS
4.0000 mg | ORAL_TABLET | Freq: Four times a day (QID) | ORAL | Status: DC | PRN
Start: 2023-09-30 — End: 2023-10-08

## 2023-09-30 MED ORDER — ACETAMINOPHEN 650 MG RE SUPP: 650.0000 mg | Freq: Four times a day (QID) | RECTAL | Status: DC | PRN

## 2023-09-30 NOTE — ED Notes (Signed)
 ED TO INPATIENT HANDOFF REPORT  ED Nurse Name and Phone #:   S Name/Age/Gender Susan Bennett 83 y.o. female Room/Bed: APA08/APA08  Code Status   Code Status: Not on file  Home/SNF/Other Home Patient oriented to: self and place Is this baseline? Yes   Triage Complete: Triage complete  Chief Complaint Hematochezia [K92.1]  Triage Note No notes on file   Allergies No Known Allergies  Level of Care/Admitting Diagnosis ED Disposition     ED Disposition  Admit   Condition  --   Comment  Hospital Area: [facility] [[id number]]  Level of Care: Med-Surg [16]  Covid Evaluation: Asymptomatic - no recent exposure (last 10 days) testing not required  Diagnosis: Hematochezia [170498]  Admitting Physician: Myrl Askew [[id number]]  Attending Physician: Myrl Askew [[id number]]          B Medical/Surgery History Past Medical History:  Diagnosis Date   Acute kidney failure (HCC) 06/04/2022   Aspiration into respiratory tract 06/04/2022   Bilateral lower extremity edema 06/19/2022   Chronic diarrhea    CKD (chronic kidney disease), stage III (HCC) 09/04/2023   -Patient seems to be at baseline  -Will monitor     Contact dermatitis and other eczema, due to unspecified cause    Dehydration, severe 06/04/2022   Depressive disorder, not elsewhere classified    Early onset Alzheimer dementia (HCC) 12/02/2016   -Continue memantine , mirtazapine      Idiopathic urticaria 06/19/2022   Mild episode of recurrent major depressive disorder (HCC) 04/19/2016   Moderate protein malnutrition (HCC) 06/10/2022   Osteoporosis, unspecified    Other and unspecified hyperlipidemia    Other B-complex deficiencies    Primary hypertension 04/19/2016   -Continue benazepril      Protrusion of lumbar intervertebral disc 11/15/2021   TIA (transient ischemic attack) 09/04/2023   -CTA head and neck negative for acute ischemic event  -Evidence of atherosclerotic lesions, however known  occlusive  -For MRI brain in the morning  -Start aspirin 81 mg daily and Plavix 75 mg daily  -Atorvastatin 20 mg daily     Unspecified essential hypertension    Unspecified gastritis and gastroduodenitis without mention of hemorrhage    Unspecified vitamin D  deficiency    Vitamin B12 deficiency 12/02/2016   Past Surgical History:  Procedure Laterality Date   COLONOSCOPY     9 YEARS AGO     A IV Location/Drains/Wounds Patient Lines/Drains/Airways Status     Active Line/Drains/Airways     Name Placement date Placement time Site Days   Peripheral IV 09/30/23 20 G Right Antecubital 09/30/23  1854  Antecubital  less than 1            Intake/Output Last 24 hours No intake or output data in the 24 hours ending 09/30/23 2116  Labs/Imaging Results for orders placed or performed during the hospital encounter of 09/30/23 (from the past 48 hours)  POCT occult blood stool     Status: Abnormal   Collection Time: 09/30/23  7:03 PM  Result Value Ref Range   Fecal Occult Blood, POC Positive (A) Negative   Card #1 Date     Card #2 Fecal Occult Blod, POC     Card #2 Date     Card #3 Fecal Occult Blood, POC     Card #3 Date    Comprehensive metabolic panel     Status: Abnormal   Collection Time: 09/30/23  7:33 PM  Result Value Ref Range   Sodium 140 135 - 145  mmol/L   Potassium 4.0 3.5 - 5.1 mmol/L   Chloride 113 (H) 98 - 111 mmol/L   CO2 22 22 - 32 mmol/L   Glucose, Bld 146 (H) 70 - 99 mg/dL    Comment: Glucose reference range applies only to samples taken after fasting for at least 8 hours.   BUN 23 8 - 23 mg/dL   Creatinine, Ser 7.56 (H) 0.44 - 1.00 mg/dL   Calcium 8.5 (L) 8.9 - 10.3 mg/dL   Total Protein 6.0 (L) 6.5 - 8.1 g/dL   Albumin 2.8 (L) 3.5 - 5.0 g/dL   AST 12 (L) 15 - 41 U/L   ALT 13 0 - 44 U/L   Alkaline Phosphatase 135 (H) 38 - 126 U/L   Total Bilirubin <0.2 0.0 - 1.2 mg/dL   GFR, Estimated 34 (L) >60 mL/min    Comment: (NOTE) Calculated using the CKD-EPI  Creatinine Equation (2021)    Anion gap 5 5 - 15    Comment: Performed at [facility], [address], [place], [place] [address]  CBC with Differential     Status: Abnormal   Collection Time: 09/30/23  7:33 PM  Result Value Ref Range   WBC 6.3 4.0 - 10.5 K/uL   RBC 3.45 (L) 3.87 - 5.11 MIL/uL   Hemoglobin 10.6 (L) 12.0 - 15.0 g/dL   HCT 51.8 (L) 84.1 - 66.0 %   MCV 99.7 80.0 - 100.0 fL   MCH 30.7 26.0 - 34.0 pg   MCHC 30.8 30.0 - 36.0 g/dL   RDW 63.0 16.0 - 10.9 %   Platelets 219 150 - 400 K/uL   nRBC 0.0 0.0 - 0.2 %   Neutrophils Relative % 67 %   Neutro Abs 4.3 1.7 - 7.7 K/uL   Lymphocytes Relative 13 %   Lymphs Abs 0.8 0.7 - 4.0 K/uL   Monocytes Relative 9 %   Monocytes Absolute 0.5 0.1 - 1.0 K/uL   Eosinophils Relative 9 %   Eosinophils Absolute 0.5 0.0 - 0.5 K/uL   Basophils Relative 1 %   Basophils Absolute 0.0 0.0 - 0.1 K/uL   Immature Granulocytes 1 %   Abs Immature Granulocytes 0.07 0.00 - 0.07 K/uL    Comment: Performed at [facility], [address], [place], [place] [address]  Type and screen [facility]     Status: None   Collection Time: 09/30/23  7:33 PM  Result Value Ref Range   ABO/RH(D) O POS    Antibody Screen NEG    Sample Expiration      10/03/2023,2359 Performed at [facility], [address], [place], [place] [address]    No results found.  Pending Labs Unresulted Labs (From admission, onward)    None       Vitals/Pain Today's Vitals   09/30/23 1915 09/30/23 1945 09/30/23 1954 09/30/23 2015  BP: (!) 177/60 (!) 181/61  (!) 161/67  Pulse: 62 63  60  Resp: 17 17  17   Temp:      TempSrc:      SpO2: 96% 95%  94%  Weight:      Height:      PainSc:   0-No pain     Isolation Precautions No active isolations  Medications Medications - No data to display  Mobility walks with device     Focused Assessments    R Recommendations: See Admitting Provider Note  Report given to:   Additional Notes:

## 2023-09-30 NOTE — ED Provider Notes (Signed)
 [place] [facility]GENCY DEPARTMENT AT [facility] Provider Note  CSN: [id number] Arrival date & time: 09/30/23 1830  Chief Complaint(s) Rectal Bleeding  HPI Susan Bennett is a 83 y.o. female history of dementia, hypertension presenting to the emergency department with rectal bleeding.  Patient brought in by EMS for reported rectal bleeding at home.  They saw some blood in the patient's brief and toilet.  Apparently family called for EMS.  Patient does not know why she is here.  She denies any complaints currently like any chest pain, abdominal pain, headaches, nausea or vomiting and says she feels pretty well.  History limited due to dementia.   Past Medical History Past Medical History:  Diagnosis Date   Chronic diarrhea    Contact dermatitis and other eczema, due to unspecified cause    Depressive disorder, not elsewhere classified    Osteoporosis, unspecified    Other and unspecified hyperlipidemia    Other B-complex deficiencies    Unspecified essential hypertension    Unspecified gastritis and gastroduodenitis without mention of hemorrhage    Unspecified vitamin D  deficiency    Patient Active Problem List   Diagnosis Date Noted   CKD (chronic kidney disease), stage III (HCC) 09/04/2023   TIA (transient ischemic attack) 09/04/2023   Idiopathic urticaria 06/19/2022   Bilateral lower extremity edema 06/19/2022   Moderate protein malnutrition (HCC) 06/10/2022   Acute kidney failure (HCC) 06/04/2022   Aspiration into respiratory tract 06/04/2022   Atelectasis 06/04/2022   Dehydration, severe 06/04/2022   Protrusion of lumbar intervertebral disc 11/15/2021   Early onset Alzheimer dementia (HCC) 12/02/2016   Vitamin B12 deficiency 12/02/2016   Primary hypertension 04/19/2016   Mild episode of recurrent major depressive disorder (HCC) 04/19/2016   Home Medication(s) Prior to Admission medications   Medication Sig Start Date End Date Taking? Authorizing Provider   benazepril  (LOTENSIN ) 40 MG tablet Take 1 tablet (40 mg total) by mouth daily. 10/18/21   Eliodoro Guerin, DO  cephALEXin  (KEFLEX ) 500 MG capsule Take 1 capsule (500 mg total) by mouth 3 (three) times daily. 06/01/22   Orvilla Blander, MD  cetirizine  (ZYRTEC ) 10 MG tablet Take 1 tablet (10 mg total) by mouth 2 (two) times daily. 07/10/22 08/09/22  Rochester Chuck, MD  Dermatological Products, Misc. [facility]) lotion Apply 1 Application topically 2 (two) times daily as needed. 08/16/22   Rochester Chuck, MD  desonide  (DESOWEN ) 0.05 % ointment Apply 1 Application topically 2 (two) times daily. 06/19/22   Ambs, Jeanmarie Millet, FNP  diclofenac  (VOLTAREN ) 50 MG EC tablet TAKE 1 TABLET BY MOUTH TWICE DAILY AS NEEDED FOR PAIN (ONLY USE IF TYLENOL NOT WORKING) 02/12/22   Vicky Grange M, DO  DM-APAP-CPM (CORICIDIN HBP) 10-325-2 MG TABS Take 1 tablet by mouth every 6 (six) hours as needed (Every 6-8 hours as needed). 08/02/21   Galvin Jules, FNP  doxepin  (SINEQUAN ) 10 MG capsule Take 1 capsule (10 mg total) by mouth at bedtime. 05/15/22 06/14/22  Rochester Chuck, MD  dupilumab  (DUPIXENT ) 300 MG/2ML prefilled syringe Inject 300 mg into the skin every 14 (fourteen) days. 11/27/22   Rochester Chuck, MD  Emollient [facility] EX) Apply topically 2 (two) times daily as needed.    [provider]  EPINEPHrine  0.3 mg/0.3 mL IJ SOAJ injection Inject 0.3 mg into the muscle as needed for anaphylaxis. 05/09/22   Galvin Jules, FNP  famotidine  (PEPCID ) 20 MG tablet Take 1 tablet (20 mg total) by mouth 2 (two)  times daily. 05/15/22 06/14/22  Rochester Chuck, MD  furosemide  (LASIX ) 20 MG tablet Take 1 tablet (20 mg total) by mouth daily. Patient taking differently: Take 10 mg by mouth daily. 06/01/22   Orvilla Blander, MD  gabapentin  (NEURONTIN ) 100 MG capsule Take 2 capsules (200 mg total) by mouth at bedtime. 07/31/22 08/30/22  Rochester Chuck, MD  lidocaine  (LIDODERM ) 5 % Place 1 patch onto the  skin daily. Remove & Discard patch within 12 hours or as directed by MD 08/08/21   Eliodoro Guerin, DO  loratadine  (CLARITIN ) 10 MG tablet Take 1 tablet (10 mg total) by mouth daily. 06/19/22   Ambs, Jeanmarie Millet, FNP  memantine  (NAMENDA ) 10 MG tablet Take 1 tablet (10 mg total) by mouth 2 (two) times daily. 04/03/22   Galvin Jules, FNP  montelukast  (SINGULAIR ) 10 MG tablet Take 1 tablet (10 mg total) by mouth at bedtime. 05/16/22   Rochester Chuck, MD  polyethylene glycol powder [facility]) 17 GM/SCOOP powder Take 17 g by mouth once.    [provider]  sertraline  (ZOLOFT ) 50 MG tablet Take 1.5 tablets (75 mg total) by mouth daily. 10/03/21   Eliodoro Guerin, DO  triamcinolone  0.1% oint-Cerave equivalent lotion 1:1 mixture Apply topically 2 (two) times daily. 06/19/22   Ardie Kras, FNP  triamcinolone  ointment (KENALOG ) 0.1 % Apply 1 Application topically 2 (two) times daily. 06/19/22   Ardie Kras, FNP                                                                                                                                    Past Surgical History Past Surgical History:  Procedure Laterality Date   COLONOSCOPY     9 YEARS AGO   Family History Family History  Problem Relation Age of Onset   Healthy Sister    Healthy Brother    Throat cancer Brother    Stroke Brother    Heart disease Brother    Throat cancer Maternal Aunt     Social History Social History   Tobacco Use   Smoking status: Every Day    Current packs/day: 0.00    Types: Cigarettes    Last attempt to quit: 02/20/2001    Years since quitting: 22.6    Passive exposure: Current   Smokeless tobacco: Never  Vaping Use   Vaping status: Never Used  Substance Use Topics   Alcohol use: No   Drug use: No   Allergies Patient has no known allergies.  Review of Systems Review of Systems  All other systems reviewed and are negative.   Physical Exam Vital Signs  I have reviewed the triage  vital signs BP (!) 161/67   Pulse 60   Temp 98.7 F (37.1 C) (Oral)   Resp 17   Ht 5\' 3"  (1.6 m)   Wt 45.1 kg   SpO2 94%   BMI 17.61 kg/m  Physical Exam Vitals and nursing note reviewed.  Constitutional:      General: She is not in acute distress.    Appearance: She is well-developed.  HENT:     Head: Normocephalic and atraumatic.     Mouth/Throat:     Mouth: Mucous membranes are moist.  Eyes:     Pupils: Pupils are equal, round, and reactive to light.  Cardiovascular:     Rate and Rhythm: Normal rate and regular rhythm.     Heart sounds: No murmur heard. Pulmonary:     Effort: Pulmonary effort is normal. No respiratory distress.     Breath sounds: Normal breath sounds.  Abdominal:     General: Abdomen is flat.     Palpations: Abdomen is soft.     Tenderness: There is no abdominal tenderness.  Genitourinary:    Comments: Chaperoned by RN, bright red blood per rectum mixed with stool Musculoskeletal:        General: No tenderness.     Right lower leg: No edema.     Left lower leg: No edema.  Skin:    General: Skin is warm and dry.  Neurological:     General: No focal deficit present.     Mental Status: She is alert. Mental status is at baseline.     Comments: Oriented to self, knows she is in the hospital, does not know why or what year it is  Psychiatric:        Mood and Affect: Mood normal.        Behavior: Behavior normal.     ED Results and Treatments Labs (all labs ordered are listed, but only abnormal results are displayed) Labs Reviewed  COMPREHENSIVE METABOLIC PANEL WITH GFR - Abnormal; Notable for the following components:      Result Value   Chloride 113 (*)    Glucose, Bld 146 (*)    Creatinine, Ser 1.52 (*)    Calcium 8.5 (*)    Total Protein 6.0 (*)    Albumin 2.8 (*)    AST 12 (*)    Alkaline Phosphatase 135 (*)    GFR, Estimated 34 (*)    All other components within normal limits  CBC WITH DIFFERENTIAL/PLATELET - Abnormal; Notable for  the following components:   RBC 3.45 (*)    Hemoglobin 10.6 (*)    HCT 34.4 (*)    All other components within normal limits  POC OCCULT BLOOD, ED  TYPE AND SCREEN                                                                                                                          Radiology No results found.  Pertinent labs & imaging results that were available during my care of the patient were reviewed by me and considered in my medical decision making (see MDM for details).  Medications Ordered in ED Medications - No data to display  Procedures Procedures  (including critical care time)  Medical Decision Making / ED Course   MDM:  83 year old presenting to the emergency department with rectal bleeding.  Patient does have signs of rectal bleeding on digital rectal exam.  She is not on any anticoagulants.  Will check labs including CBC.  She is currently hemodynamically stable with hypertension.  Differential includes diverticulosis, AVM, internal hemorrhoid.  Lower concern for dangerous cause such as aortoenteric fistula or brisk upper GI bleeding given patient's very well appearance.  Clinical Course as of 09/30/23 2047  Tue Sep 30, 2023  2045 Labs show slight drop in hemoglobin as compared to previous seen on outside records from [facility].  Discussed with the patient's daughter, she would like patient to be admitted for observation.  Discussed with Dr. Sammi Crick with GI, he agrees to consult.  Request clear liquid diet which was ordered.  Discussed with the hospitalist Dr. Jeannene Milling, he will admit patient. [WS]    Clinical Course User Index [WS] Mordecai Applebaum, MD     Additional history obtained: -Additional history obtained from ems, daughter, caregiver -External records from outside source obtained and reviewed including: Chart review  including previous notes, labs, imaging, consultation notes including prior notes    Lab Tests: -I ordered, reviewed, and interpreted labs.   The pertinent results include:   Labs Reviewed  COMPREHENSIVE METABOLIC PANEL WITH GFR - Abnormal; Notable for the following components:      Result Value   Chloride 113 (*)    Glucose, Bld 146 (*)    Creatinine, Ser 1.52 (*)    Calcium 8.5 (*)    Total Protein 6.0 (*)    Albumin 2.8 (*)    AST 12 (*)    Alkaline Phosphatase 135 (*)    GFR, Estimated 34 (*)    All other components within normal limits  CBC WITH DIFFERENTIAL/PLATELET - Abnormal; Notable for the following components:   RBC 3.45 (*)    Hemoglobin 10.6 (*)    HCT 34.4 (*)    All other components within normal limits  POC OCCULT BLOOD, ED  TYPE AND SCREEN    Notable for mild anemia, stable CKD    Medicines ordered and prescription drug management: No orders of the defined types were placed in this encounter.   -I have reviewed the patients home medicines and have made adjustments as needed   Consultations Obtained: I requested consultation with the GI physician ,  and discussed lab and imaging findings as well as pertinent plan - they recommend: admit    Cardiac Monitoring: The patient was maintained on a cardiac monitor.  I personally viewed and interpreted the cardiac monitored which showed an underlying rhythm of: NSR  Reevaluation: After the interventions noted above, I reevaluated the patient and found that their symptoms have improved  Co morbidities that complicate the patient evaluation  Past Medical History:  Diagnosis Date   Chronic diarrhea    Contact dermatitis and other eczema, due to unspecified cause    Depressive disorder, not elsewhere classified    Osteoporosis, unspecified    Other and unspecified hyperlipidemia    Other B-complex deficiencies    Unspecified essential hypertension    Unspecified gastritis and gastroduodenitis without  mention of hemorrhage    Unspecified vitamin D  deficiency       Dispostion: Disposition decision including need for hospitalization was considered, and patient admitted to the hospital.    Final Clinical Impression(s) / ED Diagnoses Final diagnoses:  Rectal  bleeding     This chart was dictated using voice recognition software.  Despite best efforts to proofread,  errors can occur which can change the documentation meaning.    Mordecai Applebaum, MD 09/30/23 [phone]

## 2023-09-30 NOTE — ED Notes (Signed)
 ED Provider at bedside.

## 2023-09-30 NOTE — Progress Notes (Signed)
   09/30/23 2251  TOC Brief Assessment  Insurance and Status Reviewed  Patient has primary care physician Yes  Home environment has been reviewed From retirement home  Prior level of function: Assisted  Prior/Current Home Services Current home services Psychiatrist)  Social Drivers of Health Review SDOH reviewed interventions complete (smoking cessation added to AVS)  Readmission risk has been reviewed Yes  Transition of care needs no transition of care needs at this time   Transition of Care Department [facility]) has reviewed patient and no other TOC needs have been identified at this time. We will continue to monitor patient advancement through interdisciplinary progression rounds. If new patient needs arise, please place a TOC consult.

## 2023-10-01 DIAGNOSIS — K921 Melena: Secondary | ICD-10-CM | POA: Diagnosis not present

## 2023-10-01 DIAGNOSIS — D649 Anemia, unspecified: Secondary | ICD-10-CM | POA: Diagnosis not present

## 2023-10-01 DIAGNOSIS — R1310 Dysphagia, unspecified: Secondary | ICD-10-CM | POA: Diagnosis not present

## 2023-10-01 DIAGNOSIS — K625 Hemorrhage of anus and rectum: Secondary | ICD-10-CM

## 2023-10-01 LAB — CBC
HCT: 30.3 % — ABNORMAL LOW (ref 36.0–46.0)
HCT: 32.4 % — ABNORMAL LOW (ref 36.0–46.0)
Hemoglobin: 9.4 g/dL — ABNORMAL LOW (ref 12.0–15.0)
Hemoglobin: 10.1 g/dL — ABNORMAL LOW (ref 12.0–15.0)
MCH: 30.6 pg (ref 26.0–34.0)
MCH: 31 pg (ref 26.0–34.0)
MCHC: 31 g/dL (ref 30.0–36.0)
MCHC: 31.2 g/dL (ref 30.0–36.0)
MCV: 98.7 fL (ref 80.0–100.0)
MCV: 99.4 fL (ref 80.0–100.0)
Platelets: 214 10*3/uL (ref 150–400)
Platelets: 223 10*3/uL (ref 150–400)
RBC: 3.07 MIL/uL — ABNORMAL LOW (ref 3.87–5.11)
RBC: 3.26 MIL/uL — ABNORMAL LOW (ref 3.87–5.11)
RDW: 13.8 % (ref 11.5–15.5)
RDW: 13.7 % (ref 11.5–15.5)
WBC: 5.5 10*3/uL (ref 4.0–10.5)
WBC: 5.6 10*3/uL (ref 4.0–10.5)
nRBC: 0 % (ref 0.0–0.2)
nRBC: 0 % (ref 0.0–0.2)

## 2023-10-01 LAB — ABO/RH: ABO/RH(D): O POS

## 2023-10-01 MED ORDER — DOXEPIN HCL 10 MG PO CAPS
10.0000 mg | ORAL_CAPSULE | Freq: Every day | ORAL | Status: DC
Start: 2023-10-01 — End: 2023-10-02

## 2023-10-01 MED ORDER — SERTRALINE HCL 50 MG PO TABS
50.0000 mg | ORAL_TABLET | Freq: Every day | ORAL | Status: DC
  Administered 2023-10-02 – 2023-10-08 (×6): 50 mg via ORAL
  Filled 2023-10-01 (×6): qty 1

## 2023-10-01 MED ORDER — CLOPIDOGREL BISULFATE 75 MG PO TABS
75.0000 mg | ORAL_TABLET | Freq: Every day | ORAL | Status: DC
  Administered 2023-10-02 – 2023-10-05 (×3): 75 mg via ORAL
  Filled 2023-10-01 (×3): qty 1

## 2023-10-01 MED ORDER — ATORVASTATIN CALCIUM 20 MG PO TABS
20.0000 mg | ORAL_TABLET | Freq: Every day | ORAL | Status: DC
  Administered 2023-10-02 – 2023-10-08 (×6): 20 mg via ORAL
  Filled 2023-10-01 (×6): qty 1

## 2023-10-01 MED ORDER — BUSPIRONE HCL 5 MG PO TABS
7.5000 mg | ORAL_TABLET | Freq: Two times a day (BID) | ORAL | Status: DC
  Administered 2023-10-01 – 2023-10-08 (×15): 7.5 mg via ORAL
  Filled 2023-10-01 (×17): qty 2

## 2023-10-01 MED ORDER — MEMANTINE HCL 10 MG PO TABS
10.0000 mg | ORAL_TABLET | Freq: Two times a day (BID) | ORAL | Status: DC
  Administered 2023-10-01 – 2023-10-08 (×15): 10 mg via ORAL
  Filled 2023-10-01 (×15): qty 1

## 2023-10-01 MED ORDER — PANTOPRAZOLE SODIUM 40 MG IV SOLR
40.0000 mg | Freq: Two times a day (BID) | INTRAVENOUS | Status: DC
  Administered 2023-10-01 – 2023-10-07 (×12): 40 mg via INTRAVENOUS
  Filled 2023-10-01 (×13): qty 10

## 2023-10-01 MED ORDER — BENAZEPRIL HCL 20 MG PO TABS
40.0000 mg | ORAL_TABLET | Freq: Every day | ORAL | Status: DC
  Administered 2023-10-02 – 2023-10-05 (×4): 40 mg via ORAL
  Filled 2023-10-01 (×5): qty 2

## 2023-10-01 MED ORDER — GABAPENTIN 100 MG PO CAPS
200.0000 mg | ORAL_CAPSULE | Freq: Every day | ORAL | Status: DC
  Administered 2023-10-01 – 2023-10-07 (×8): 200 mg via ORAL
  Filled 2023-10-01 (×8): qty 2

## 2023-10-01 NOTE — Consult Note (Signed)
 Gastroenterology Consult   Referring Provider: No ref. provider found Primary Care Physician:  Theoplis Fix, MD Primary Gastroenterologist:  Not established   Patient ID: Susan Bennett; [id number]; 10/15/1940   Admit date: 09/30/2023  LOS: 0 days   Date of Consultation: 10/01/2023  Reason for Consultation:  Rectal bleeding   History of Present Illness   Susan Bennett is a 83 y.o. year old female with history of dementia, CKD, prior stroke and HTN who presented to the ED with rectal bleeding, brought in via EMS after staff at SNF saw some blood in patient's brief and in the toilet. Patient unable to provide pertinent history due to dementia. GI consulted for further evaluation  ED Course: Hgb 10.6, VSS Creat 1.52, LFTs WNL WBC 6.3 Rectal exam in ED with BRBPR mixed with stool/FOBT +  Consult:  Patient states no complaints. She feels "blah." She is hungry.   Spoke with patient's daughter Susan Bennett who states that she is concerned about the rectal bleeding patient had yesterday as she has had no episodes like this before. She states she was told that patient had a BM and then had a "large amount of BRB in the floor upon standing." Diane, patient's sitter who is with her often states, no blood in stools on Monday when she was with her. BMs are looser but "thick" she does not strain to defecate and usually can let them know when she needs to have a BM. Stools have been this way chronically. She states she was not with the patient yesterday. She notes she has never seen patient have rectal bleeding since she has been sitting with her. Patient has been a bit more tired and wanting to sleep more lately.   sitter also reports some issues with swallowing over the past 1-2 weeks, noted a lot of extra chewing and holding pills in her mouth. Having coughing of phlegm up after eating often times.   Last colonoscopy: 02/2002? by Dr. Florette Hurry, normal per notes in chart though no normal review Daughter  does not think she has had any more colonoscopy since 2012 when this colonoscopy was mentioned in the notes, was supposed to have TCS in 2012 but this was cancelled.    Past Medical History:  Diagnosis Date   Acute kidney failure (HCC) 06/04/2022   Aspiration into respiratory tract 06/04/2022   Bilateral lower extremity edema 06/19/2022   Chronic diarrhea    CKD (chronic kidney disease), stage III (HCC) 09/04/2023   -Patient seems to be at baseline  -Will monitor     Contact dermatitis and other eczema, due to unspecified cause    Dehydration, severe 06/04/2022   Depressive disorder, not elsewhere classified    Early onset Alzheimer dementia (HCC) 12/02/2016   -Continue memantine , mirtazapine      Idiopathic urticaria 06/19/2022   Mild episode of recurrent major depressive disorder (HCC) 04/19/2016   Moderate protein malnutrition (HCC) 06/10/2022   Osteoporosis, unspecified    Other and unspecified hyperlipidemia    Other B-complex deficiencies    Primary hypertension 04/19/2016   -Continue benazepril      Protrusion of lumbar intervertebral disc 11/15/2021   TIA (transient ischemic attack) 09/04/2023   -CTA head and neck negative for acute ischemic event  -Evidence of atherosclerotic lesions, however known occlusive  -For MRI brain in the morning  -Start aspirin 81 mg daily and Plavix 75 mg daily  -Atorvastatin 20 mg daily     Unspecified essential hypertension    Unspecified gastritis  and gastroduodenitis without mention of hemorrhage    Unspecified vitamin D  deficiency    Vitamin B12 deficiency 12/02/2016    Past Surgical History:  Procedure Laterality Date   COLONOSCOPY     9 YEARS AGO    Prior to Admission medications   Medication Sig Start Date End Date Taking? Authorizing Provider  atorvastatin (LIPITOR) 20 MG tablet Take 20 mg by mouth daily.   Yes [provider]  benazepril  (LOTENSIN ) 40 MG tablet Take 1 tablet (40 mg total) by mouth daily. 10/18/21  Yes  Gottschalk, Ashly M, DO  busPIRone (BUSPAR) 7.5 MG tablet Take 7.5 mg by mouth 2 (two) times daily. 06/27/23  Yes [provider]  cetirizine  (ZYRTEC ) 10 MG tablet Take 1 tablet (10 mg total) by mouth 2 (two) times daily. 07/10/22 09/30/23 Yes Rochester Chuck, MD  clopidogrel (PLAVIX) 75 MG tablet Take 75 mg by mouth daily.   Yes [provider]  doxepin  (SINEQUAN ) 10 MG capsule Take 1 capsule (10 mg total) by mouth at bedtime. 05/15/22 09/30/23 Yes Rochester Chuck, MD  dupilumab  (DUPIXENT ) 300 MG/2ML prefilled syringe Inject 300 mg into the skin every 14 (fourteen) days. 11/27/22  Yes Rochester Chuck, MD  gabapentin  (NEURONTIN ) 100 MG capsule Take 2 capsules (200 mg total) by mouth at bedtime. Patient taking differently: Take 100 mg by mouth at bedtime. 07/31/22 09/30/23 Yes Rochester Chuck, MD  memantine  (NAMENDA ) 10 MG tablet Take 1 tablet (10 mg total) by mouth 2 (two) times daily. 04/03/22  Yes Rakes, Georgeann Kindred, FNP  polyethylene glycol powder (GLYCOLAX/MIRALAX) 17 GM/SCOOP powder Take 17 g by mouth daily as needed for mild constipation.   Yes [provider]  sertraline  (ZOLOFT ) 50 MG tablet Take 1.5 tablets (75 mg total) by mouth daily. 10/03/21  Yes Vicky Grange M, DO    Current Facility-Administered Medications  Medication Dose Route Frequency Provider Last Rate Last Admin   acetaminophen (TYLENOL) tablet 650 mg  650 mg Oral Q6H PRN Myrl Askew, MD       Or   acetaminophen (TYLENOL) suppository 650 mg  650 mg Rectal Q6H PRN Myrl Askew, MD       atorvastatin (LIPITOR) tablet 20 mg  20 mg Oral Daily Dorrell, Robert, MD       benazepril  (LOTENSIN ) tablet 40 mg  40 mg Oral Daily Dorrell, Robert, MD       busPIRone (BUSPAR) tablet 7.5 mg  7.5 mg Oral BID Dorrell, Robert, MD   7.5 mg at 10/01/23 0045   clopidogrel (PLAVIX) tablet 75 mg  75 mg Oral Daily Dorrell, Robert, MD       doxepin  (SINEQUAN ) capsule 10 mg  10 mg Oral QHS Dorrell,  Porfirio Bristol, MD       enoxaparin (LOVENOX) injection 30 mg  30 mg Subcutaneous Q24H Dorrell, Robert, MD       gabapentin  (NEURONTIN ) capsule 200 mg  200 mg Oral QHS Myrl Askew, MD   200 mg at 10/01/23 0045   memantine  (NAMENDA ) tablet 10 mg  10 mg Oral BID Myrl Askew, MD   10 mg at 10/01/23 0050   ondansetron (ZOFRAN) tablet 4 mg  4 mg Oral Q6H PRN Myrl Askew, MD       Or   ondansetron (ZOFRAN) injection 4 mg  4 mg Intravenous Q6H PRN Dorrell, Robert, MD       oxyCODONE (Oxy IR/ROXICODONE) immediate release tablet 5 mg  5 mg Oral Q4H PRN Myrl Askew, MD  sertraline  (ZOLOFT ) tablet 75 mg  75 mg Oral Daily Dorrell, Porfirio Bristol, MD        Allergies as of 09/30/2023   (No Known Allergies)    Review of Systems   Gen: Denies any fever, chills, loss of appetite, change in weight or weight loss CV: Denies chest pain, heart palpitations, syncope, edema  Resp: Denies shortness of breath with rest, cough, wheezing, coughing up blood, and pleurisy. GI: denies melena, nausea, vomiting, diarrhea, constipation, odynophagia, early satiety or weight loss. +rectal bleeding +dysphagia GU : Denies urinary burning, blood in urine, urinary frequency, and urinary incontinence. MS: Denies joint pain, limitation of movement, swelling, cramps, and atrophy.  Derm: Denies rash, itching, dry skin, hives. Psych: Denies depression, anxiety, memory loss, hallucinations, and confusion. Heme: Denies bruising or bleeding Neuro:  Denies any headaches, dizziness, paresthesias, shaking  Physical Exam   Vital Signs in last 24 hours: Temp:  [98.1 F (36.7 C)-98.7 F (37.1 C)] 98.1 F (36.7 C) (04/30 0150) Pulse Rate:  [54-63] 54 (04/30 0150) Resp:  [14-17] 14 (04/30 0150) BP: (117-209)/(52-73) 117/52 (04/30 0150) SpO2:  [92 %-96 %] 95 % (04/30 0150) Weight:  [45.1 kg-54.3 kg] 54.3 kg (04/29 2217) Last BM Date :  (PTA)  General:   Alert,  Well-developed, well-nourished, pleasant and cooperative in  NAD Head:  Normocephalic and atraumatic. Eyes:  Sclera clear, no icterus.   Conjunctiva pink. Ears:  Normal auditory acuity. Mouth:  No deformity or lesions, dentition normal. Lungs:  Clear throughout to auscultation.   No wheezes, crackles, or rhonchi. No acute distress. Heart:  Regular rate and rhythm; no murmurs, clicks, rubs,  or gallops. Abdomen:  Soft, nontender and mildly distended. No masses, hepatosplenomegaly or hernias noted. Normal bowel sounds, without guarding, and without rebound.   Msk:  Symmetrical without gross deformities. Normal posture. Extremities:  Without clubbing or edema. Neurologic:  Alert and  pleasant, disoriented  Skin:  Intact without significant lesions or rashes. Psych:  Alert and cooperative. Normal mood and affect.   Labs/Studies   Recent Labs Recent Labs    09/30/23 1933 10/01/23 0400  WBC 6.3 5.5  HGB 10.6* 9.4*  HCT 34.4* 30.3*  PLT 219 214   BMET Recent Labs    09/30/23 1933  NA 140  K 4.0  CL 113*  CO2 22  GLUCOSE 146*  BUN 23  CREATININE 1.52*  CALCIUM 8.5*   LFT Recent Labs    09/30/23 1933  PROT 6.0*  ALBUMIN 2.8*  AST 12*  ALT 13  ALKPHOS 135*  BILITOT <0.2    Assessment   YOSHINO JUTRAS is a 83 y.o. year old female with history of CKD, dementia, HTN who presented from SNF with rectal bleeding, GI consulted for further evaluation.  Rectal bleeding/anemia: hgb 10.6 on admission, 10.2 nine months ago, down to 9.4 this morning, but back to 10.1 on repeat, likely some aspect of hemodilution with other cell lines down as well. Patient with history of dementia, unable to provide much history but denies abdominal pain, rectal pain. Sitter notes no constipation or straining. She has been eating okay since admission other than issues with swallowing per her sitter diane. No BMs this morning per her sitter. She is passing flatus, abdomen mildly distended but no pain on exam. States appetite has been down, though she has actually  gained some weight. Last colonoscopy appears to have been around 2003/normal? No formal report. Query if this may be a diverticular bleed, low suspicion for significant  GI bleeding in setting of increasing hemoglobin.   Would favor trending h&h, monitoring for further rectal bleeding. I Discussed conservative management via watchful waiting with the patient's daughter Susan Bennett who wishes to hold off on colonoscopy unless bleeding persists or hemoglobin declines further, given patient's advanced age, dementia and other co-morbidities. Per nursing staff, no further rectal bleeding today.   Dysphagia: new onset over the last 1-2 weeks per her sitter Diane, states chewing much longer and holding pills in her mouth. No diagnosis of GERD per her daughter, occasionally has some heartburn. No vomiting but does cough up some phlegm at times after eating. Hospitalist aware and has ordered SLP evaluation, pending what this shows, could consider BPE or EGD for further evaluation as needed. Patient's daughter is amenable to EGD if needed. For now will start PPI.    Plan / Recommendations   Monitor for overt GI bleeding Trend H&H, transfuse for hgb <7 Protonix 40mg  BID Agree with with SLP evaluation for dysphagia 5. Consider colonoscopy if bleeding persists/hgb declines 6. Consider BPE/EGD pending SLP evaluation for dysphagia   10/01/2023, 9:09 AM  Ryver Poblete L. Cherita Hebel, MSN, APRN, AGNP-C Adult-Gerontology Nurse Practitioner [facility]enterology at [facility]

## 2023-10-01 NOTE — Evaluation (Addendum)
 Clinical/Bedside Swallow Evaluation Patient Details  Name: Susan Bennett MRN: [id number] Date of Birth: 1940/09/04  Today's Date: 10/01/2023 Time: SLP Start Time (ACUTE ONLY): 1520 SLP Stop Time (ACUTE ONLY): 1549 SLP Time Calculation (min) (ACUTE ONLY): 29 min  Past Medical History:  Past Medical History:  Diagnosis Date   Acute kidney failure (HCC) 06/04/2022   Aspiration into respiratory tract 06/04/2022   Bilateral lower extremity edema 06/19/2022   Chronic diarrhea    CKD (chronic kidney disease), stage III (HCC) 09/04/2023   -Patient seems to be at baseline  -Will monitor     Contact dermatitis and other eczema, due to unspecified cause    Dehydration, severe 06/04/2022   Depressive disorder, not elsewhere classified    Early onset Alzheimer dementia (HCC) 12/02/2016   -Continue memantine , mirtazapine      Idiopathic urticaria 06/19/2022   Mild episode of recurrent major depressive disorder (HCC) 04/19/2016   Moderate protein malnutrition (HCC) 06/10/2022   Osteoporosis, unspecified    Other and unspecified hyperlipidemia    Other B-complex deficiencies    Primary hypertension 04/19/2016   -Continue benazepril      Protrusion of lumbar intervertebral disc 11/15/2021   TIA (transient ischemic attack) 09/04/2023   -CTA head and neck negative for acute ischemic event  -Evidence of atherosclerotic lesions, however known occlusive  -For MRI brain in the morning  -Start aspirin 81 mg daily and Plavix 75 mg daily  -Atorvastatin 20 mg daily     Unspecified essential hypertension    Unspecified gastritis and gastroduodenitis without mention of hemorrhage    Unspecified vitamin D  deficiency    Vitamin B12 deficiency 12/02/2016   Past Surgical History:  Past Surgical History:  Procedure Laterality Date   COLONOSCOPY     9 YEARS AGO   HPI:  83 y.o. female with medical history significant of CKD stage IIIb, dementia, prior stroke who presented due to single episode of hematochezia.   Patient was in the bathroom at living facility and nursing had cleaned her rectum with noted bright red blood.  She was brought to the ER for further assessment.  On arrival she was afebrile and hemodynamically stable.  Labs were obtained which showed creatinine 1.52, AST 12, ALT 13, alkaline phosphatase 135, WBC 6.3, hemoglobin 10.6.  Patient underwent chest x-ray which showed no acute findings. patient's daughter reported concerns regarding patient's swallowing. BSE requested    Assessment / Plan / Recommendation  Clinical Impression  Clinical swallowing evaluation complete with Pt's daughter and sitter at bedside. Daughter reports increased episodes of getting "strangled" on water and oral holding of solids and medications. Pt consumed thin liquids via straw without overt s/sx of oropharyngeal dysphagia. Pt consumed fruit cup with prolonged mastication, oral residue after the swallow and oral holding. Pieces of fruit were eventually expectorated. SLP provided education to sitter and daughter that dysphagia is primarily cognitive based and will wax and wane based on cognitive function, time of day, alertness, etc. Suspect oral holding of meds and oral holding in general is again related to mentation. Pt is currently on clear liquid diet per GI recommendation; from oropharyngeal perspective recommend D2/fine chop and thin liquids; Defer diet upgrade to GI. Please ensure Pt is sitting upright and alert/responsive when PO is provided. Recommend crush meds in puree. Recommend ST f/u at facility to assess Pt in her environment and make recommendations and provide education based on those factors in her every day environment. There are no further ST needs acutely, our service will  sign off. Above to RN, daughter and sitter. SLP Visit Diagnosis: Dysphagia, unspecified (R13.10)    Aspiration Risk  Mild aspiration risk;Moderate aspiration risk    Diet Recommendation Dysphagia 2 (Fine chop);Thin liquid    Liquid  Administration via: Cup;Straw Medication Administration: Crushed with puree Supervision: Patient able to self feed Compensations: Minimize environmental distractions;Slow rate;Small sips/bites Postural Changes: Seated upright at 90 degrees    Other  Recommendations Oral Care Recommendations: Oral care BID    Recommendations for follow up therapy are one component of a multi-disciplinary discharge planning process, led by the attending physician.  Recommendations may be updated based on patient status, additional functional criteria and insurance authorization.  Follow up Recommendations Home health SLP            Frequency and Duration min 1 x/week  1 week       Prognosis Prognosis for improved oropharyngeal function: Fair Barriers to Reach Goals: Cognitive deficits      Swallow Study   General Date of Onset: 09/30/23 HPI: 83 y.o. female with medical history significant of CKD stage IIIb, dementia, prior stroke who presented due to single episode of hematochezia.  Patient was in the bathroom at living facility and nursing had cleaned her rectum with noted bright red blood.  She was brought to the ER for further assessment.  On arrival she was afebrile and hemodynamically stable.  Labs were obtained which showed creatinine 1.52, AST 12, ALT 13, alkaline phosphatase 135, WBC 6.3, hemoglobin 10.6.  Patient underwent chest x-ray which showed no acute findings. patient's daughter reported concerns regarding patient's swallowing. BSE requested Type of Study: Bedside Swallow Evaluation Previous Swallow Assessment: none in chart Diet Prior to this Study: Regular;Thin liquids (Level 0) Temperature Spikes Noted: No Respiratory Status: Room air History of Recent Intubation: No Behavior/Cognition: Alert;Cooperative;Pleasant mood Oral Cavity Assessment: Within Functional Limits Oral Care Completed by SLP: Recent completion by staff Oral Cavity - Dentition: Edentulous;Other (Comment) (has  dentures at facility, does not always wear them) Vision: Functional for self-feeding Self-Feeding Abilities: Able to feed self Patient Positioning: Upright in chair Baseline Vocal Quality: Normal Volitional Cough: Strong Volitional Swallow: Able to elicit    Oral/Motor/Sensory Function Overall Oral Motor/Sensory Function: Within functional limits   Ice Chips Ice chips: Within functional limits   Thin Liquid Thin Liquid: Within functional limits    Nectar Thick Nectar Thick Liquid: Not tested   Honey Thick Honey Thick Liquid: Not tested   Puree Puree: Not tested   Solid     Solid: Impaired Presentation: Spoon Oral Phase Functional Implications: Prolonged oral transit;Oral holding     Dariyah Garduno H. Vergil Glasser, CCC-SLP Speech Language Pathologist  Florina Husbands 10/01/2023,3:49 PM

## 2023-10-01 NOTE — Progress Notes (Signed)
 PROGRESS NOTE Susan Bennett  ZOX:[id number] DOB: 04-26-1941 DOA: 09/30/2023 PCP: Theoplis Fix, MD  Brief Narrative/Hospital Course:  83 y.o. female with medical history significant of CKD stage IIIb, dementia, prior stroke who presented due to single episode of hematochezia.  Patient was in the bathroom at living facility and nursing had cleaned her rectum with noted bright red blood.  She was brought to the ER for further assessment.  On arrival she was afebrile and hemodynamically stable.  Labs were obtained which showed creatinine 1.52, AST 12, ALT 13, alkaline phosphatase 135, WBC 6.3, hemoglobin 10.6.  Patient underwent chest x-ray which showed no acute findings. Patient was admitted for further workup.  Spoke with family who requested overnight observation due to concern for recurrent bleeding. Overnight remained afebrile BP on higher side 170s-hemodynamically stable, repeat hemoglobin 9.4 g from 10.6, b/l HB ~1 GM IN 12/23/22.  Repeat hemoglobin has been stable.  Discussed plan of care with patient's daughter they are concerned about patient's swallowing, speech eval requested before discharge back .  Subjective: Seen thsi am Per daughter she is holding meds and now awalloing Overnight afebrile BP stable hemoglobin overall stable  Assessment/Plan   Hematochezia single episode Chronic anemia: Last colonoscopy 9 years ago which was reportedly normal, Etiology of bleeding likely secondary to hemorrhoidal Endo diverticular.  Remains hemodynamically stable Hb overall stable will recheck Hb today at 10.1 gm and stable.  GI input appreciated continue PPI. On CLD-advance as tolerated pending speech clearance   ?Dysphagia: Slp eval requested .   Hypertension On higher side,continue benazepril    GAD: continue BuSpar   History of stroke: continue Plavix  Depression Cognitive impairment: Stable, continue Namenda  and her Zoloft , BuSpar, doxepin    HLD: continue Lipitor   DVT  prophylaxis: enoxaparin (LOVENOX) injection 30 mg Start: 10/01/23 1000 SCDs Start: 09/30/23 2232 Code Status:   Code Status: Full Code Family Communication: plan of care discussed with patient/daughter Patient status is: Remains hospitalized because of severity of illness Level of care: Med-Surg   Dispo: The patient is from: home            Anticipated disposition: TBD Objective: Vitals last 24 hrs: Vitals:   09/30/23 2015 09/30/23 2130 09/30/23 2217 10/01/23 0150  BP: (!) 161/67 (!) 158/69 (!) 188/64 (!) 117/52  Pulse: 60 (!) 57 60 (!) 54  Resp: 17 16 15 14   Temp:   98.2 F (36.8 C) 98.1 F (36.7 C)  TempSrc:   Oral Oral  SpO2: 94% 92% 94% 95%  Weight:   54.3 kg   Height:        Physical Examination: General exam: alert awake, pleasant and oriented at baseline  HEENT:Oral mucosa moist, Ear/Nose WNL grossly Respiratory system: B.L clear BS, no use of accessory muscle Cardiovascular system: S1 & S2 +. Gastrointestinal system: Abdomen soft,  NT,ND,BS+ Nervous System: Alert, awake,  following commands. Extremities: LE edema neg, warm extremities Skin: No rashes,warm. MSK: Normal muscle bulk/tone.   Data Reviewed: I have personally reviewed following labs and imaging studies ( see epic result tab) CBC: Recent Labs  Lab 09/30/23 1933 10/01/23 0400 10/01/23 0942  WBC 6.3 5.5 5.6  NEUTROABS 4.3  --   --   HGB 10.6* 9.4* 10.1*  HCT 34.4* 30.3* 32.4*  MCV 99.7 98.7 99.4  PLT 219 214 223   CMP: Recent Labs  Lab 09/30/23 1933  NA 140  K 4.0  CL 113*  CO2 22  GLUCOSE 146*  BUN 23  CREATININE 1.52*  CALCIUM 8.5*   GFR: Estimated Creatinine Clearance: 22.8 mL/min (A) (by C-G formula based on SCr of 1.52 mg/dL (H)). Recent Labs  Lab 09/30/23 1933  AST 12*  ALT 13  ALKPHOS 135*  BILITOT <0.2  PROT 6.0*  ALBUMIN 2.8*   No results for input(s): "LIPASE", "AMYLASE" in the last 168 hours. No results for input(s): "AMMONIA" in the last 168 hours. Coagulation  Profile: No results for input(s): "INR", "PROTIME" in the last 168 hours. Unresulted Labs (From admission, onward)     Start     Ordered   10/07/23 0500  Creatinine, serum  (enoxaparin (LOVENOX)    CrCl >/= 30 ml/min)  Weekly,   R     Comments: while on enoxaparin therapy    09/30/23 2233           Antimicrobials/Microbiology: Anti-infectives (From admission, onward)    None      No results found for: "SDES", "SPECREQUEST", "CULT", "REPTSTATUS"  Procedures:  Medications reviewed: Scheduled Meds:  atorvastatin  20 mg Oral Daily   benazepril   40 mg Oral Daily   busPIRone  7.5 mg Oral BID   clopidogrel  75 mg Oral Daily   doxepin   10 mg Oral QHS   enoxaparin (LOVENOX) injection  30 mg Subcutaneous Q24H   gabapentin   200 mg Oral QHS   memantine   10 mg Oral BID   pantoprazole (PROTONIX) IV  40 mg Intravenous Q12H   sertraline   75 mg Oral Daily   Continuous Infusions:  Lesa Rape, MD [facility] 10/01/2023, 1:18 PM

## 2023-10-01 NOTE — Plan of Care (Signed)

## 2023-10-01 NOTE — Care Management Obs Status (Signed)
 MEDICARE OBSERVATION STATUS NOTIFICATION   Patient Details  Name: Susan Bennett MRN: [id number] Date of Birth: Sep 06, 1940   Medicare Observation Status Notification Given:  Yes    Geraldina Klinefelter, RN 10/01/2023, 4:13 PM

## 2023-10-01 NOTE — Plan of Care (Signed)

## 2023-10-01 NOTE — H&P (Signed)
 History and Physical    Susan Bennett ZOX:[id number] DOB: 12-May-1941 DOA: 09/30/2023  PCP: Theoplis Fix, MD   Chief Complaint: Hematochezia  HPI: Susan Bennett is a 83 y.o. female with medical history significant of CKD stage IIIb, dementia, prior stroke who presents manage department due to [facility] episode of hematochezia.  Patient was in the bathroom at living facility and nursing had cleaned her rectum with noted bright red blood.  She was brought to the ER for further assessment.  On arrival she was afebrile and hemodynamically stable.  Labs were obtained which showed creatinine 1.52, AST 12, ALT 13, alkaline phosphatase 135, WBC 6.3, hemoglobin 10.6.  Patient underwent chest x-ray which showed no acute findings.  Patient was admitted for further workup.  Spoke with family who requested overnight observation due to concern for recurrent bleeding.   Review of Systems: Review of Systems  Constitutional: Negative.  Negative for chills, fever and weight loss.  HENT: Negative.    Eyes: Negative.   Respiratory: Negative.    Cardiovascular: Negative.   Gastrointestinal: Negative.   Genitourinary: Negative.   Musculoskeletal: Negative.   Skin: Negative.   Neurological: Negative.   Endo/Heme/Allergies: Negative.   Psychiatric/Behavioral: Negative.       As per HPI otherwise 10 point review of systems negative.   No Known Allergies  Past Medical History:  Diagnosis Date   Acute kidney failure (HCC) 06/04/2022   Aspiration into respiratory tract 06/04/2022   Bilateral lower extremity edema 06/19/2022   Chronic diarrhea    CKD (chronic kidney disease), stage III (HCC) 09/04/2023   -Patient seems to be at baseline  -Will monitor     Contact dermatitis and other eczema, due to unspecified cause    Dehydration, severe 06/04/2022   Depressive disorder, not elsewhere classified    Early onset Alzheimer dementia (HCC) 12/02/2016   -Continue memantine , mirtazapine      Idiopathic urticaria  06/19/2022   Mild episode of recurrent major depressive disorder (HCC) 04/19/2016   Moderate protein malnutrition (HCC) 06/10/2022   Osteoporosis, unspecified    Other and unspecified hyperlipidemia    Other B-complex deficiencies    Primary hypertension 04/19/2016   -Continue benazepril      Protrusion of lumbar intervertebral disc 11/15/2021   TIA (transient ischemic attack) 09/04/2023   -CTA head and neck negative for acute ischemic event  -Evidence of atherosclerotic lesions, however known occlusive  -For MRI brain in the morning  -Start aspirin 81 mg daily and Plavix 75 mg daily  -Atorvastatin 20 mg daily     Unspecified essential hypertension    Unspecified gastritis and gastroduodenitis without mention of hemorrhage    Unspecified vitamin D  deficiency    Vitamin B12 deficiency 12/02/2016    Past Surgical History:  Procedure Laterality Date   COLONOSCOPY     9 YEARS AGO     reports that she has been smoking cigarettes. She has been exposed to tobacco smoke. She has never used smokeless tobacco. She reports that she does not drink alcohol and does not use drugs.  Family History  Problem Relation Age of Onset   Healthy Sister    Healthy Brother    Throat cancer Brother    Stroke Brother    Heart disease Brother    Throat cancer Maternal Aunt     Prior to Admission medications   Medication Sig Start Date End Date Taking? Authorizing Provider  atorvastatin (LIPITOR) 20 MG tablet Take 20 mg by mouth daily.   Yes  [provider]  benazepril  (LOTENSIN ) 40 MG tablet Take 1 tablet (40 mg total) by mouth daily. 10/18/21  Yes Gottschalk, Ashly M, DO  busPIRone (BUSPAR) 7.5 MG tablet Take 7.5 mg by mouth 2 (two) times daily. 06/27/23  Yes [provider]  cetirizine  (ZYRTEC ) 10 MG tablet Take 1 tablet (10 mg total) by mouth 2 (two) times daily. 07/10/22 09/30/23 Yes Rochester Chuck, MD  clopidogrel (PLAVIX) 75 MG tablet Take 75 mg by mouth daily.   Yes [provider]  doxepin  (SINEQUAN ) 10 MG capsule Take 1 capsule (10 mg total) by mouth at bedtime. 05/15/22 09/30/23 Yes Rochester Chuck, MD  dupilumab  (DUPIXENT ) 300 MG/2ML prefilled syringe Inject 300 mg into the skin every 14 (fourteen) days. 11/27/22  Yes Rochester Chuck, MD  gabapentin  (NEURONTIN ) 100 MG capsule Take 2 capsules (200 mg total) by mouth at bedtime. Patient taking differently: Take 100 mg by mouth at bedtime. 07/31/22 09/30/23 Yes Rochester Chuck, MD  memantine  (NAMENDA ) 10 MG tablet Take 1 tablet (10 mg total) by mouth 2 (two) times daily. 04/03/22  Yes Rakes, Georgeann Kindred, FNP  polyethylene glycol powder (GLYCOLAX/MIRALAX) 17 GM/SCOOP powder Take 17 g by mouth daily as needed for mild constipation.   Yes [provider]  sertraline  (ZOLOFT ) 50 MG tablet Take 1.5 tablets (75 mg total) by mouth daily. 10/03/21  Yes Eliodoro Guerin, DO    Physical Exam: Vitals:   09/30/23 1945 09/30/23 2015 09/30/23 2130 09/30/23 2217  BP: (!) 181/61 (!) 161/67 (!) 158/69 (!) 188/64  Pulse: 63 60 (!) 57 60  Resp: 17 17 16 15   Temp:    98.2 F (36.8 C)  TempSrc:    Oral  SpO2: 95% 94% 92% 94%  Weight:    54.3 kg  Height:       Physical Exam Constitutional:      Appearance: She is normal weight.  HENT:     Head: Normocephalic.     Nose: Nose normal.     Mouth/Throat:     Mouth: Mucous membranes are moist.     Pharynx: Oropharynx is clear.  Eyes:     Conjunctiva/sclera: Conjunctivae normal.     Pupils: Pupils are equal, round, and reactive to light.  Cardiovascular:     Rate and Rhythm: Normal rate and regular rhythm.     Pulses: Normal pulses.     Heart sounds: Normal heart sounds.  Pulmonary:     Effort: Pulmonary effort is normal.     Breath sounds: Normal breath sounds.  Abdominal:     General: Abdomen is flat. Bowel sounds are normal. There is no distension.  Musculoskeletal:        General: Normal range of motion.     Cervical back: Normal range of  motion.  Skin:    General: Skin is warm.     Capillary Refill: Capillary refill takes less than 2 seconds.  Neurological:     General: No focal deficit present.     Mental Status: She is alert.  Psychiatric:        Mood and Affect: Mood normal.        Labs on Admission: I have personally reviewed the patients's labs and imaging studies.  Assessment/Plan Principal Problem:   Hematochezia   # Hematochezia - Last colonoscopy 9 years ago which was reportedly normal - Etiology of bleeding likely secondary to hemorrhoidal Endo diverticular - Hemoglobin stable over the past 9 months  Plan: Check  labs in morning Patient can likely discharge if hemoglobin is stable  # Hypertension-continue benazepril   # Generalized anxiety-continue BuSpar  # History of stroke-continue Plavix  # Cognitive impairment-continue Namenda   # Depression-continue Zoloft , BuSpar, doxepin   # Hyperlipidemia-continue Lipitor   Admission status: Observation Med-Surg  Certification: The appropriate patient status for this patient is OBSERVATION. Observation status is judged to be reasonable and necessary in order to provide the required intensity of service to ensure the patient's safety. The patient's presenting symptoms, physical exam findings, and initial radiographic and laboratory data in the context of their medical condition is felt to place them at decreased risk for further clinical deterioration. Furthermore, it is anticipated that the patient will be medically stable for discharge from the hospital within 2 midnights of admission.     Myrl Askew MD [facility] If 7PM-7AM, please contact night-coverage [email or web address]  10/01/2023, 12:09 AM

## 2023-10-01 NOTE — Hospital Course (Addendum)
 83 y.o. female with medical history significant of CKD stage IIIb, dementia, prior stroke who presented due to single episode of hematochezia.  Patient was in the bathroom at living facility and nursing had cleaned her rectum with noted bright red blood.  She was brought to the ER for further assessment.  On arrival she was afebrile and hemodynamically stable.  Labs were obtained which showed creatinine 1.52, AST 12, ALT 13, alkaline phosphatase 135, WBC 6.3, hemoglobin 10.6.  Patient underwent chest x-ray which showed no acute findings. Patient was admitted for further workup.  Spoke with family who requested overnight observation due to concern for recurrent bleeding. Overnight remained afebrile BP on higher side 170s-hemodynamically stable, repeat hemoglobin 9.4 g from 10.6, b/l HB ~1 GM IN 12/23/22.  Repeat hemoglobin has been stable.  Discussed plan of care with patient's daughter they are concerned about patient's swallowing,  SlP consulted-after evaluation diet by dysphagia to find chopped with thin liquid:  Subjective: Seen examined this morning Caregiver at the bedside N.p.o. for scope today, patient is hungry Overnight events afebrile BP at times high in 200s  during night Remains npo for scope  Discharge diagnoses:  Hematochezia single episode Chronic anemia: Last colonoscopy 9 years ago which was reportedly normal, -suspect hemorrhoidal vs diverticular. Hemodynamically stable, hemoglobin is decreasing some- plan is for endoscopy for dysphagia evaluation and colonoscopy for hematochezia as per GI . GI input appreciated continue PPI. Cont CLD and await further plan from GI Recent Labs  Lab 09/30/23 1933 10/01/23 0400 10/01/23 0942 10/02/23 0954 10/03/23 0641  HGB 10.6* 9.4* 10.1* 11.1* 9.9*  HCT 34.4* 30.3* 32.4* 34.9* 29.6*     Dysphagia: Slp eval requested and advised dysphagia diet.  Going for EGD to evaluate dysphagia  Hypertension BP labile at times poorly controlled,  home benazepril , on hydralazine  PRN (adjust to 5 mg to decrease rapid drop)   GAD: continue BuSpar    History of stroke: continue Plavix   Depression Cognitive impairment: Stable, continue Namenda  and her Zoloft , BuSpar , doxepin    HLD: continue Lipitor

## 2023-10-02 DIAGNOSIS — K625 Hemorrhage of anus and rectum: Secondary | ICD-10-CM | POA: Diagnosis not present

## 2023-10-02 DIAGNOSIS — D649 Anemia, unspecified: Secondary | ICD-10-CM | POA: Diagnosis not present

## 2023-10-02 DIAGNOSIS — R1310 Dysphagia, unspecified: Secondary | ICD-10-CM | POA: Diagnosis not present

## 2023-10-02 DIAGNOSIS — K644 Residual hemorrhoidal skin tags: Secondary | ICD-10-CM | POA: Diagnosis not present

## 2023-10-02 DIAGNOSIS — R1319 Other dysphagia: Secondary | ICD-10-CM

## 2023-10-02 DIAGNOSIS — K921 Melena: Secondary | ICD-10-CM | POA: Diagnosis not present

## 2023-10-02 LAB — CBC WITH DIFFERENTIAL/PLATELET
Abs Immature Granulocytes: 0.07 10*3/uL (ref 0.00–0.07)
Basophils Absolute: 0 10*3/uL (ref 0.0–0.1)
Basophils Relative: 1 %
Eosinophils Absolute: 0.4 10*3/uL (ref 0.0–0.5)
Eosinophils Relative: 6 %
HCT: 34.9 % — ABNORMAL LOW (ref 36.0–46.0)
Hemoglobin: 11.1 g/dL — ABNORMAL LOW (ref 12.0–15.0)
Immature Granulocytes: 1 %
Lymphocytes Relative: 21 %
Lymphs Abs: 1.3 10*3/uL (ref 0.7–4.0)
MCH: 31.4 pg (ref 26.0–34.0)
MCHC: 31.8 g/dL (ref 30.0–36.0)
MCV: 98.6 fL (ref 80.0–100.0)
Monocytes Absolute: 0.5 10*3/uL (ref 0.1–1.0)
Monocytes Relative: 8 %
Neutro Abs: 3.9 10*3/uL (ref 1.7–7.7)
Neutrophils Relative %: 63 %
Platelets: 225 10*3/uL (ref 150–400)
RBC: 3.54 MIL/uL — ABNORMAL LOW (ref 3.87–5.11)
RDW: 13.9 % (ref 11.5–15.5)
WBC: 6.1 10*3/uL (ref 4.0–10.5)
nRBC: 0 % (ref 0.0–0.2)

## 2023-10-02 MED ORDER — PEG 3350-KCL-NA BICARB-NACL 420 G PO SOLR
4000.0000 mL | Freq: Once | ORAL | Status: AC
  Administered 2023-10-02: 4000 mL via ORAL

## 2023-10-02 MED ORDER — SODIUM CHLORIDE 0.9 % IV SOLN: INTRAVENOUS | Status: AC

## 2023-10-02 MED ORDER — MIRTAZAPINE 15 MG PO TABS
15.0000 mg | ORAL_TABLET | Freq: Every day | ORAL | Status: DC
  Administered 2023-10-02 – 2023-10-05 (×4): 15 mg via ORAL
  Filled 2023-10-02 (×4): qty 1

## 2023-10-02 MED ORDER — HYDRALAZINE HCL 20 MG/ML IJ SOLN
10.0000 mg | Freq: Four times a day (QID) | INTRAMUSCULAR | Status: DC | PRN
  Administered 2023-10-02: 10 mg via INTRAVENOUS
  Filled 2023-10-02: qty 1

## 2023-10-02 MED ORDER — BISACODYL 5 MG PO TBEC
10.0000 mg | DELAYED_RELEASE_TABLET | Freq: Once | ORAL | Status: AC
  Administered 2023-10-02: 10 mg via ORAL
  Filled 2023-10-02: qty 2

## 2023-10-02 NOTE — Plan of Care (Signed)
  Problem: Clinical Measurements: Goal: Will remain free from infection Outcome: Progressing Goal: Respiratory complications will improve Outcome: Progressing   Problem: Clinical Measurements: Goal: Respiratory complications will improve Outcome: Progressing   Problem: Activity: Goal: Risk for activity intolerance will decrease Outcome: Progressing

## 2023-10-02 NOTE — Plan of Care (Signed)

## 2023-10-02 NOTE — Progress Notes (Addendum)
 Gastroenterology Progress Note   Referring Provider: No ref. provider found Primary Care Physician:  Theoplis Fix, MD Primary Gastroenterologist:  previously unassigned (Dr. Riley Cheadle)  Patient ID: Susan Bennett; [id number]; 02/06/1941   Subjective:    Patient's private sitter, Diane, and her nurse at bedside. She had some dark blood noted yesterday when Diane was cleaning her up/giving her a bath. Saw some small clots. Nursing staff reported very small amount of blood noted last night as well. Currently sitting up in chair eating clear liquid diet. Had a muffin her sitter gave her.  Objective:   Vital signs in last 24 hours: Temp:  [98 F (36.7 C)-99 F (37.2 C)] 98 F (36.7 C) (05/01 0512) Pulse Rate:  [49-58] 49 (05/01 0512) Resp:  [18-22] 18 (05/01 0512) BP: (162-165)/(57-66) 165/58 (05/01 0512) SpO2:  [94 %-98 %] 98 % (05/01 0512) Last BM Date :  (PTA) General:   Alert,  Well-developed, well-nourished, pleasant and cooperative in NAD Head:  Normocephalic and atraumatic. Eyes:  Sclera clear, no icterus.   Abdomen:  Soft, nontender and nondistended.  Normal bowel sounds, without guarding, and without rebound.   Rectal: hemorrhoidal tissue notes externally, internal exam without pain, no masses palpated, brown stool with dark blood noted.  Extremities:  Without clubbing, deformity or edema. Neurologic:  Alert ;  grossly normal neurologically. Skin:  Intact without significant lesions or rashes. Psych:  Alert and cooperative. Normal mood and affect.  Intake/Output from previous day: 04/30 0701 - 05/01 0700 In: 600 [P.O.:600] Out: -  Intake/Output this shift: No intake/output data recorded.  Lab Results: CBC Recent Labs    09/30/23 1933 10/01/23 0400 10/01/23 0942  WBC 6.3 5.5 5.6  HGB 10.6* 9.4* 10.1*  HCT 34.4* 30.3* 32.4*  MCV 99.7 98.7 99.4  PLT 219 214 223   BMET Recent Labs    09/30/23 1933  NA 140  K 4.0  CL 113*  CO2 22  GLUCOSE 146*  BUN 23   CREATININE 1.52*  CALCIUM  8.5*   LFTs Recent Labs    09/30/23 1933  BILITOT <0.2  ALKPHOS 135*  AST 12*  ALT 13  PROT 6.0*  ALBUMIN 2.8*   No results for input(s): "LIPASE" in the last 72 hours. PT/INR No results for input(s): "LABPROT", "INR" in the last 72 hours.       Imaging Studies: No results found.[2 weeks]  Assessment:    Susan Bennett is a 83 y.o. year old female with history of CKD, dementia, HTN who presented from SNF with rectal bleeding, GI consulted for further evaluation.   Rectal bleeding/anemia: hgb 10.6-->9.4-->10.1 yesterday. Nine months ago, Hgb 10.1.  Sitter notes no constipation or straining. She has been eating okay since admission other than issues with swallowing per her sitter Diane. Noted to have some blood noted when bathing yesterday and nursing staff noted small amount of blood as well. On exam today, she was noted to have hemorrhoidal tissue externally, on internal exam some brown soft stool with dark blood mixed in it. No pain on exam. Last colonoscopy appears to have been around 2003/normal? No formal report. Query if this may be a diverticular bleed, low suspicion for significant GI bleeding in setting of stable Hgb. Cannot rule out underlying malignancy.     Discussion yesterday between GI team and daughter, Bernardo Bridgeman, planned for conservative management via watchful waiting with the patient's daughter Bernardo Bridgeman, wanting to hold off on colonoscopy unless bleeding persists or hemoglobin declines further, given patient's  advanced age, dementia and other co-morbidities. I updated Bernardo Bridgeman by phone today, we will update Hgb and make further decisions at that time.    Dysphagia: new onset over the last 1-2 weeks per her sitter Diane, states chewing much longer and holding pills in her mouth. No diagnosis of GERD per her daughter, occasionally has some heartburn. No vomiting but does cough up some phlegm at times after eating. Hospitalist aware and has ordered  SLP evaluation, suspected dysphagia due to cognitive issues. Recommending D2/fine chop and thin liquids. Recommended crush meds in puree. Recommended ST f/u in facility to assess patient in her environment and and make recommendations and provide education based on those factors in her every day environment.   From a GI standpoint, would consider following ST recommendations and can reassess later if BPE should be considered.        Plan:   CBC now. Continue clear liquids until decision made regarding work up of rectal bleeding. Continue PPI BID.    LOS: 0 days   Trudie Fuse. Verlie Gl[facility] [phone] 5/1/20258:49 AM    Addendum: Hgb this morning 11.1. Discussed with daughter regarding additional episodes of blood per rectum (yesterday evening) and rectal exam today showing dark blood mixed in the stool. Source of bleeding is not clear. Could be diverticular, underlying malignancy not excluded. Discussed with Dr. Alita Irwin, given need for antiplatelet therapy, ideally would offer her colonoscopy +/- EGD to evaluate for source of bleeding. She has some vague dysphagia symptoms which might be related to dementia but would offer EGD+/-ED at time of colonoscopy. She will be difficult prep in setting of dementia and swallowing concerns. Would be best to attempt while inpatient. Discussed risks, alternatives, benefits with regards to but not limited to the risk of reaction to medication, bleeding, infection, perforation and the daughter is agreeable to proceed.   Trudie Fuse. Verlie Glisson [facility] [phone] 5/1/20252:13 PM

## 2023-10-02 NOTE — Progress Notes (Signed)
 PROGRESS NOTE Susan Bennett  CBJ:[id number] DOB: 15-Jan-1941 DOA: 09/30/2023 PCP: Theoplis Fix, MD  Brief Narrative/Hospital Course:  83 y.o. female with medical history significant of CKD stage IIIb, dementia, prior stroke who presented due to single episode of hematochezia.  Patient was in the bathroom at living facility and nursing had cleaned her rectum with noted bright red blood.  She was brought to the ER for further assessment.  On arrival she was afebrile and hemodynamically stable.  Labs were obtained which showed creatinine 1.52, AST 12, ALT 13, alkaline phosphatase 135, WBC 6.3, hemoglobin 10.6.  Patient underwent chest x-ray which showed no acute findings. Patient was admitted for further workup.  Spoke with family who requested overnight observation due to concern for recurrent bleeding. Overnight remained afebrile BP on higher side 170s-hemodynamically stable, repeat hemoglobin 9.4 g from 10.6, b/l HB ~1 GM IN 12/23/22.  Repeat hemoglobin has been stable.  Discussed plan of care with patient's daughter they are concerned about patient's swallowing,  SlP consulted-after evaluation diet by dysphagia to find chopped with thin liquid:  Subjective: Seen and examined this morning again having rectal bleeding CBC recheck pending Afebrile heart rates 50s to 50s BP stable In 160s, on RA  Discharge diagnoses:  Hematochezia single episode Chronic anemia: Last colonoscopy 9 years ago which was reportedly normal, Etiology of bleeding likely secondary to hemorrhoidal Endo diverticular.  Remains hemodynamically stable Hb overall stable will recheck Hb again today- hb up ain 11 gm GI input appreciated continue PPI. Cont CLD and await further plan from GI Recent Labs  Lab 09/30/23 1933 10/01/23 0400 10/01/23 0942 10/02/23 0954  HGB 10.6* 9.4* 10.1* 11.1*  HCT 34.4* 30.3* 32.4* 34.9*     Dysphagia: Slp eval requested and advised dysphagia diet.   Hypertension BP stable continue home  benazepril    GAD: continue BuSpar    History of stroke: continue Plavix   Depression Cognitive impairment: Stable, continue Namenda  and her Zoloft , BuSpar , doxepin    HLD: continue Lipitor   DVT prophylaxis: enoxaparin  (LOVENOX ) injection 30 mg Start: 10/01/23 1000 SCDs Start: 09/30/23 2232 Code Status:   Code Status: Full Code Family Communication: plan of care discussed with patient/daughter Patient status is: Remains hospitalized because of severity of illness Level of care: Med-Surg   Dispo: The patient is from: home            Anticipated disposition: Pending GI clearance  Objective: Vitals last 24 hrs: Vitals:   10/01/23 0150 10/01/23 1421 10/01/23 2130 10/02/23 0512  BP: (!) 117/52 (!) 162/66 (!) 165/57 (!) 165/58  Pulse: (!) 54 (!) 56 (!) 58 (!) 49  Resp: 14 18 (!) 22 18  Temp: 98.1 F (36.7 C) 99 F (37.2 C) 98.6 F (37 C) 98 F (36.7 C)  TempSrc: Oral  Oral Oral  SpO2: 95% 96% 94% 98%  Weight:      Height:        Physical Examination:  General exam: alert awake, elderly frail HEENT:Oral mucosa moist, Ear/Nose WNL grossly Respiratory system: Bilaterally clear BS,no use of accessory muscle Cardiovascular system: S1 & S2 +, No JVD. Gastrointestinal system: Abdomen soft,NT,ND, BS+ Nervous System: Alert, awake, moving all extremities,and following commands. Extremities: LE edema neg,distal peripheral pulses palpable and warm.  Skin: No rashes,no icterus. MSK: Normal muscle bulk,tone, power   Data Reviewed: I have personally reviewed following labs and imaging studies ( see epic result tab) CBC: Recent Labs  Lab 09/30/23 1933 10/01/23 0400 10/01/23 0942 10/02/23 0954  WBC 6.3 5.5  5.6 6.1  NEUTROABS 4.3  --   --  3.9  HGB 10.6* 9.4* 10.1* 11.1*  HCT 34.4* 30.3* 32.4* 34.9*  MCV 99.7 98.7 99.4 98.6  PLT 219 214 223 225   CMP: Recent Labs  Lab 09/30/23 1933  NA 140  K 4.0  CL 113*  CO2 22  GLUCOSE 146*  BUN 23  CREATININE 1.52*  CALCIUM   8.5*   GFR: Estimated Creatinine Clearance: 22.8 mL/min (A) (by C-G formula based on SCr of 1.52 mg/dL (H)). Recent Labs  Lab 09/30/23 1933  AST 12*  ALT 13  ALKPHOS 135*  BILITOT <0.2  PROT 6.0*  ALBUMIN 2.8*   No results for input(s): "LIPASE", "AMYLASE" in the last 168 hours. No results for input(s): "AMMONIA" in the last 168 hours. Coagulation Profile: No results for input(s): "INR", "PROTIME" in the last 168 hours. Unresulted Labs (From admission, onward)     Start     Ordered   10/07/23 0500  Creatinine, serum  (enoxaparin  (LOVENOX )    CrCl >/= 30 ml/min)  Weekly,   R     Comments: while on enoxaparin  therapy    09/30/23 2233           Antimicrobials/Microbiology: Anti-infectives (From admission, onward)    None      No results found for: "SDES", "SPECREQUEST", "CULT", "REPTSTATUS"  Procedures:  Medications reviewed: Scheduled Meds:  atorvastatin   20 mg Oral Daily   benazepril   40 mg Oral Daily   busPIRone   7.5 mg Oral BID   clopidogrel   75 mg Oral Daily   enoxaparin  (LOVENOX ) injection  30 mg Subcutaneous Q24H   gabapentin   200 mg Oral QHS   memantine   10 mg Oral BID   mirtazapine   15 mg Oral QHS   pantoprazole  (PROTONIX ) IV  40 mg Intravenous Q12H   sertraline   50 mg Oral Daily   Continuous Infusions:  Lesa Rape, MD [facility] 10/02/2023, 1:00 PM

## 2023-10-03 DIAGNOSIS — K625 Hemorrhage of anus and rectum: Secondary | ICD-10-CM | POA: Diagnosis not present

## 2023-10-03 DIAGNOSIS — N1832 Chronic kidney disease, stage 3b: Secondary | ICD-10-CM | POA: Diagnosis present

## 2023-10-03 DIAGNOSIS — Z8249 Family history of ischemic heart disease and other diseases of the circulatory system: Secondary | ICD-10-CM | POA: Diagnosis not present

## 2023-10-03 DIAGNOSIS — F32A Depression, unspecified: Secondary | ICD-10-CM | POA: Diagnosis present

## 2023-10-03 DIAGNOSIS — Z808 Family history of malignant neoplasm of other organs or systems: Secondary | ICD-10-CM | POA: Diagnosis not present

## 2023-10-03 DIAGNOSIS — K648 Other hemorrhoids: Secondary | ICD-10-CM | POA: Diagnosis present

## 2023-10-03 DIAGNOSIS — F411 Generalized anxiety disorder: Secondary | ICD-10-CM | POA: Diagnosis present

## 2023-10-03 DIAGNOSIS — R1319 Other dysphagia: Secondary | ICD-10-CM | POA: Diagnosis not present

## 2023-10-03 DIAGNOSIS — R680 Hypothermia, not associated with low environmental temperature: Secondary | ICD-10-CM | POA: Diagnosis not present

## 2023-10-03 DIAGNOSIS — N179 Acute kidney failure, unspecified: Secondary | ICD-10-CM | POA: Diagnosis present

## 2023-10-03 DIAGNOSIS — R001 Bradycardia, unspecified: Secondary | ICD-10-CM | POA: Diagnosis not present

## 2023-10-03 DIAGNOSIS — Z823 Family history of stroke: Secondary | ICD-10-CM | POA: Diagnosis not present

## 2023-10-03 DIAGNOSIS — K5520 Angiodysplasia of colon without hemorrhage: Secondary | ICD-10-CM | POA: Diagnosis present

## 2023-10-03 DIAGNOSIS — Z7902 Long term (current) use of antithrombotics/antiplatelets: Secondary | ICD-10-CM | POA: Diagnosis not present

## 2023-10-03 DIAGNOSIS — K6389 Other specified diseases of intestine: Secondary | ICD-10-CM | POA: Diagnosis not present

## 2023-10-03 DIAGNOSIS — E785 Hyperlipidemia, unspecified: Secondary | ICD-10-CM | POA: Diagnosis present

## 2023-10-03 DIAGNOSIS — K222 Esophageal obstruction: Secondary | ICD-10-CM | POA: Diagnosis present

## 2023-10-03 DIAGNOSIS — R1310 Dysphagia, unspecified: Secondary | ICD-10-CM | POA: Diagnosis not present

## 2023-10-03 DIAGNOSIS — D631 Anemia in chronic kidney disease: Secondary | ICD-10-CM | POA: Diagnosis present

## 2023-10-03 DIAGNOSIS — Z79899 Other long term (current) drug therapy: Secondary | ICD-10-CM | POA: Diagnosis not present

## 2023-10-03 DIAGNOSIS — I129 Hypertensive chronic kidney disease with stage 1 through stage 4 chronic kidney disease, or unspecified chronic kidney disease: Secondary | ICD-10-CM | POA: Diagnosis present

## 2023-10-03 DIAGNOSIS — Z8673 Personal history of transient ischemic attack (TIA), and cerebral infarction without residual deficits: Secondary | ICD-10-CM | POA: Diagnosis not present

## 2023-10-03 DIAGNOSIS — G300 Alzheimer's disease with early onset: Secondary | ICD-10-CM | POA: Diagnosis present

## 2023-10-03 DIAGNOSIS — D120 Benign neoplasm of cecum: Secondary | ICD-10-CM | POA: Diagnosis present

## 2023-10-03 DIAGNOSIS — K449 Diaphragmatic hernia without obstruction or gangrene: Secondary | ICD-10-CM | POA: Diagnosis present

## 2023-10-03 DIAGNOSIS — D649 Anemia, unspecified: Secondary | ICD-10-CM | POA: Diagnosis not present

## 2023-10-03 DIAGNOSIS — K5730 Diverticulosis of large intestine without perforation or abscess without bleeding: Secondary | ICD-10-CM | POA: Diagnosis not present

## 2023-10-03 DIAGNOSIS — K5731 Diverticulosis of large intestine without perforation or abscess with bleeding: Secondary | ICD-10-CM | POA: Diagnosis present

## 2023-10-03 DIAGNOSIS — K921 Melena: Secondary | ICD-10-CM | POA: Diagnosis not present

## 2023-10-03 DIAGNOSIS — F0284 Dementia in other diseases classified elsewhere, unspecified severity, with anxiety: Secondary | ICD-10-CM | POA: Diagnosis present

## 2023-10-03 DIAGNOSIS — I10 Essential (primary) hypertension: Secondary | ICD-10-CM | POA: Diagnosis not present

## 2023-10-03 DIAGNOSIS — F0283 Dementia in other diseases classified elsewhere, unspecified severity, with mood disturbance: Secondary | ICD-10-CM | POA: Diagnosis present

## 2023-10-03 LAB — CBC
HCT: 29.6 % — ABNORMAL LOW (ref 36.0–46.0)
Hemoglobin: 9.9 g/dL — ABNORMAL LOW (ref 12.0–15.0)
MCH: 32.6 pg (ref 26.0–34.0)
MCHC: 33.4 g/dL (ref 30.0–36.0)
MCV: 97.4 fL (ref 80.0–100.0)
Platelets: 209 10*3/uL (ref 150–400)
RBC: 3.04 MIL/uL — ABNORMAL LOW (ref 3.87–5.11)
RDW: 13.7 % (ref 11.5–15.5)
WBC: 5.6 10*3/uL (ref 4.0–10.5)
nRBC: 0 % (ref 0.0–0.2)

## 2023-10-03 MED ORDER — BISACODYL 5 MG PO TBEC
10.0000 mg | DELAYED_RELEASE_TABLET | Freq: Once | ORAL | Status: AC
  Administered 2023-10-03: 10 mg via ORAL
  Filled 2023-10-03: qty 2

## 2023-10-03 MED ORDER — HYDRALAZINE HCL 20 MG/ML IJ SOLN
5.0000 mg | Freq: Four times a day (QID) | INTRAMUSCULAR | Status: DC | PRN
  Administered 2023-10-03 – 2023-10-04 (×3): 5 mg via INTRAVENOUS
  Filled 2023-10-03 (×3): qty 1

## 2023-10-03 NOTE — Progress Notes (Signed)
 PROGRESS NOTE Susan Bennett  XBJ:[id number] DOB: 1940/07/02 DOA: 09/30/2023 PCP: Theoplis Fix, MD  Brief Narrative/Hospital Course:  83 y.o. female with medical history significant of CKD stage IIIb, dementia, prior stroke who presented due to single episode of hematochezia.  Patient was in the bathroom at living facility and nursing had cleaned her rectum with noted bright red blood.  She was brought to the ER for further assessment.  On arrival she was afebrile and hemodynamically stable.  Labs were obtained which showed creatinine 1.52, AST 12, ALT 13, alkaline phosphatase 135, WBC 6.3, hemoglobin 10.6.  Patient underwent chest x-ray which showed no acute findings. Patient was admitted for further workup.  Spoke with family who requested overnight observation due to concern for recurrent bleeding. Overnight remained afebrile BP on higher side 170s-hemodynamically stable, repeat hemoglobin 9.4 g from 10.6, b/l HB ~1 GM IN 12/23/22.  Repeat hemoglobin has been stable.  Discussed plan of care with patient's daughter they are concerned about patient's swallowing,  SlP consulted-after evaluation diet by dysphagia to find chopped with thin liquid:  Subjective: Seen examined this morning Caregiver at the bedside N.p.o. for scope today, patient is hungry Overnight events afebrile BP at times high in 200s  during night Remains npo for scope  Discharge diagnoses:  Hematochezia single episode Chronic anemia: Last colonoscopy 9 years ago which was reportedly normal, -suspect hemorrhoidal vs diverticular. Hemodynamically stable, hemoglobin is decreasing some- plan is for endoscopy for dysphagia evaluation and colonoscopy for hematochezia as per GI . GI input appreciated continue PPI. Cont CLD and await further plan from GI Recent Labs  Lab 09/30/23 1933 10/01/23 0400 10/01/23 0942 10/02/23 0954 10/03/23 0641  HGB 10.6* 9.4* 10.1* 11.1* 9.9*  HCT 34.4* 30.3* 32.4* 34.9* 29.6*     Dysphagia: Slp  eval requested and advised dysphagia diet.  Going for EGD to evaluate dysphagia  Hypertension BP labile at times poorly controlled, home benazepril , on hydralazine  PRN (adjust to 5 mg to decrease rapid drop)   GAD: continue BuSpar    History of stroke: continue Plavix   Depression Cognitive impairment: Stable, continue Namenda  and her Zoloft , BuSpar , doxepin    HLD: continue Lipitor   DVT prophylaxis: SCDs Start: 09/30/23 2232 Code Status:   Code Status: Full Code Family Communication: plan of care discussed with patient/daughter Patient status is: Remains hospitalized because of severity of illness Level of care: Med-Surg   Dispo: The patient is from: home            Anticipated disposition: Awaiting scope   Objective: Vitals last 24 hrs: Vitals:   10/02/23 2152 10/02/23 2215 10/03/23 0120 10/03/23 0611  BP: (!) 182/64 (!) 208/64 120/60 136/62  Pulse: (!) 59  (!) 58 60  Resp: 20  18 20   Temp: 97.6 F (36.4 C)  (!) 97.5 F (36.4 C) 98.3 F (36.8 C)  TempSrc: Oral   Oral  SpO2: 93%  91% 92%  Weight:      Height:        Physical Examination: General exam: alert awake, oriented thin and frail  HEENT:Oral mucosa moist, Ear/Nose WNL grossly Respiratory system: Bilaterally clear BS,no use of accessory muscle Cardiovascular system: S1 & S2 +, No JVD. Gastrointestinal system: Abdomen soft,NT,ND, BS+ Nervous System: Alert, awake, moving all extremities,and following commands. Extremities: LE edema neg,distal peripheral pulses palpable and warm.  Skin: No rashes,no icterus. MSK: Normal muscle bulk,tone, power   Data Reviewed: I have personally reviewed following labs and imaging studies ( see epic result tab)  CBC: Recent Labs  Lab 09/30/23 1933 10/01/23 0400 10/01/23 0942 10/02/23 0954 10/03/23 0641  WBC 6.3 5.5 5.6 6.1 5.6  NEUTROABS 4.3  --   --  3.9  --   HGB 10.6* 9.4* 10.1* 11.1* 9.9*  HCT 34.4* 30.3* 32.4* 34.9* 29.6*  MCV 99.7 98.7 99.4 98.6 97.4  PLT  219 214 223 225 209   CMP: Recent Labs  Lab 09/30/23 1933  NA 140  K 4.0  CL 113*  CO2 22  GLUCOSE 146*  BUN 23  CREATININE 1.52*  CALCIUM  8.5*   GFR: Estimated Creatinine Clearance: 22.8 mL/min (A) (by C-G formula based on SCr of 1.52 mg/dL (H)). Recent Labs  Lab 09/30/23 1933  AST 12*  ALT 13  ALKPHOS 135*  BILITOT <0.2  PROT 6.0*  ALBUMIN 2.8*   No results for input(s): "LIPASE", "AMYLASE" in the last 168 hours. No results for input(s): "AMMONIA" in the last 168 hours. Coagulation Profile: No results for input(s): "INR", "PROTIME" in the last 168 hours. Unresulted Labs (From admission, onward)    None      Antimicrobials/Microbiology: Anti-infectives (From admission, onward)    None      No results found for: "SDES", "SPECREQUEST", "CULT", "REPTSTATUS"  Procedures: Procedure(s) (LRB): COLONOSCOPY (N/A) EGD (ESOPHAGOGASTRODUODENOSCOPY) (N/A) DILATION, ESOPHAGUS (N/A) Medications reviewed: Scheduled Meds:  atorvastatin   20 mg Oral Daily   benazepril   40 mg Oral Daily   busPIRone   7.5 mg Oral BID   clopidogrel   75 mg Oral Daily   gabapentin   200 mg Oral QHS   memantine   10 mg Oral BID   mirtazapine   15 mg Oral QHS   pantoprazole  (PROTONIX ) IV  40 mg Intravenous Q12H   sertraline   50 mg Oral Daily   Continuous Infusions:  sodium chloride       Lesa Rape, MD [facility] 10/03/2023, 11:35 AM

## 2023-10-03 NOTE — Progress Notes (Signed)
   10/03/23 1402  Assess: MEWS Score  Temp (!) 97.4 F (36.3 C)  BP (!) 205/67  MAP (mmHg) 107  Pulse Rate (!) 58  Resp 18  SpO2 95 %  O2 Device Room Air  Assess: MEWS Score  MEWS Temp 0  MEWS Systolic 2  MEWS Pulse 0  MEWS RR 0  MEWS LOC 0  MEWS Score 2  MEWS Score Color Yellow  Assess: if the MEWS score is Yellow or Red  Were vital signs accurate and taken at a resting state? Yes  Does the patient meet 2 or more of the SIRS criteria? No  MEWS guidelines implemented  Yes, yellow  Treat  MEWS Interventions Considered administering scheduled or prn medications/treatments as ordered  Take Vital Signs  Increase Vital Sign Frequency  Yellow: Q2hr x1, continue Q4hrs until patient remains green for 12hrs  Escalate  MEWS: Escalate Yellow: Discuss with charge nurse and consider notifying provider and/or RRT  Notify: Charge Nurse/RN  Name of Charge Nurse/RN Notified Rivka Chesterfield  Provider Notification  Provider Name/Title Lurlene Salon  Date Provider Notified 10/03/23  Time Provider Notified 1505  Method of Notification Face-to-face  Notification Reason Other (Comment) (Yellow mews)  Provider response Other (Comment)  Date of Provider Response 10/03/23  Time of Provider Response 1505  Assess: SIRS CRITERIA  SIRS Temperature  0  SIRS Respirations  0  SIRS Pulse 0  SIRS WBC 0  SIRS Score Sum  0   See mar

## 2023-10-03 NOTE — Progress Notes (Signed)
 Gastroenterology Progress Note   Referring Provider: No ref. provider found Primary Care Physician:  Theoplis Fix, MD Primary Gastroenterologist:  previously unassigned Riley Cheadle)  Patient ID: Susan Bennett; [id number]; 1941/01/28    Subjective   Plan to see patient this morning to assess colon prep.  Per night shift nursing patient only drank 2 cups last night.  Per morning nurse and patient's daytime sitter during a.m., there was the same amount of prep within the jail but this morning when she came in as there was yesterday when she left.  She was able to drink over a quarter, almost 1/2 of the jug within an hour and a half yesterday with frequent encouragement.  Patient states she wants to go home and will make another appointment for procedure.   Objective   Vital signs in last 24 hours Temp:  [97.4 F (36.3 C)-98.3 F (36.8 C)] 98.3 F (36.8 C) (05/02 0611) Pulse Rate:  [58-60] 60 (05/02 0611) Resp:  [18-20] 20 (05/02 0611) BP: (112-208)/(58-64) 136/62 (05/02 0611) SpO2:  [91 %-94 %] 92 % (05/02 0611) Last BM Date :  (PTA)  Physical Exam General:   Alert and oriented, pleasant Head:  Normocephalic and atraumatic. Eyes:  No icterus, sclera clear. Conjuctiva pink.  Extremities:  Without clubbing or edema. Neurologic:  Alert and confused to time and situation. Psych:  Alert and cooperative. Normal mood and affect.  Intake/Output from previous day: No intake/output data recorded. Intake/Output this shift: No intake/output data recorded.  Lab Results  Recent Labs    10/01/23 0942 10/02/23 0954 10/03/23 0641  WBC 5.6 6.1 5.6  HGB 10.1* 11.1* 9.9*  HCT 32.4* 34.9* 29.6*  PLT 223 225 209   BMET Recent Labs    09/30/23 1933  NA 140  K 4.0  CL 113*  CO2 22  GLUCOSE 146*  BUN 23  CREATININE 1.52*  CALCIUM  8.5*   LFT Recent Labs    09/30/23 1933  PROT 6.0*  ALBUMIN 2.8*  AST 12*  ALT 13  ALKPHOS 135*  BILITOT <0.2   PT/INR No results for input(s):  "LABPROT", "INR" in the last 72 hours. Hepatitis Panel No results for input(s): "HEPBSAG", "HCVAB", "HEPAIGM", "HEPBIGM" in the last 72 hours.  Studies/Results No results found.  Assessment  83 y.o. female with a history of CKD, dementia, HTN who presented from SNF with rectal bleeding.  GI consulted for further evaluation.  Rectal bleeding/anemia: Hemoglobin 10.6>> 9.4>> 10.1>>11.>>9.9.  About 9 months ago her hemoglobin was 10.1.  Per reports from yesterday her sitter has noted no constipation or straining at home and usually eats okay other than some mild swallowing issues.  On Wednesday she was noted to have some blood that was noted with a bath and nursing staff but also noted a small amount of blood as well.  On rectal exam yesterday she was noted to have hemorrhoidal tissue externally and on internal exam was noted to have some soft brown stool and also with some dark blood mixed within the stool.  No pain with examination.  Last colonoscopy around 2003 which was reported as normal.  There was no formal report for review.  Query possible diverticular bleed, low suspicion for significant GI bleeding given hemoglobin is overall stable but unable to rule out malignancy.  Initially made decision for conservative management however after discussion with Dr. Alita Irwin yesterday, it was decided to proceed with colonoscopy as well as EGD +/- dilation for dysphagia therefore prep was initiated yesterday afternoon.  Unfortunately prep was not completed adequately and despite having some clear stools after tapwater enema this morning, unsure if she is completely prepped acid or stated when she arrived this morning she actually had a bowel movement within the bed therefore likely not cleanout.  Dysphagia: New onset over the last 1-2 weeks per her sitter.  Patient has been chewing longer and holding pills within her mouth.  Suspect this is mental status related.  SLP agrees based off of their evaluation  yesterday.  They have ordered dysphagia 2/5 chopped and thin liquid diet and recommend crushing meds in pure.  Given swallowing difficulties, plan is for EGD at time of colonoscopy which is now delayed until tomorrow.  Plan / Recommendations  Clear liquids Further encouragement of prep today Will give additional Dulcolax today Repeat tapwater enema tomorrow Plan for EGD and colonoscopy tomorrow with Dr. Sammi Crick    LOS: 0 days    10/03/2023, 12:14 PM   Julian Obey, MSN, FNP-BC, AGACNP-BC [facility]

## 2023-10-03 NOTE — Plan of Care (Signed)
   Problem: Education: Goal: Knowledge of General Education information will improve Description: Including pain rating scale, medication(s)/side effects and non-pharmacologic comfort measures Outcome: Progressing   Problem: Health Behavior/Discharge Planning: Goal: Ability to manage health-related needs will improve Outcome: Progressing   Problem: Clinical Measurements: Goal: Will remain free from infection Outcome: Progressing

## 2023-10-04 ENCOUNTER — Encounter (HOSPITAL_COMMUNITY)
Admission: EM | Disposition: A | Discharge status: Skilled Nursing Facility | Payer: Self-pay | Source: Home / Self Care | Attending: Family Medicine

## 2023-10-04 ENCOUNTER — Inpatient Hospital Stay (HOSPITAL_COMMUNITY): Admitting: Anesthesiology

## 2023-10-04 DIAGNOSIS — K449 Diaphragmatic hernia without obstruction or gangrene: Secondary | ICD-10-CM | POA: Diagnosis not present

## 2023-10-04 DIAGNOSIS — D120 Benign neoplasm of cecum: Secondary | ICD-10-CM

## 2023-10-04 DIAGNOSIS — R1310 Dysphagia, unspecified: Secondary | ICD-10-CM | POA: Diagnosis not present

## 2023-10-04 DIAGNOSIS — K921 Melena: Secondary | ICD-10-CM | POA: Diagnosis not present

## 2023-10-04 DIAGNOSIS — K222 Esophageal obstruction: Secondary | ICD-10-CM

## 2023-10-04 DIAGNOSIS — K5730 Diverticulosis of large intestine without perforation or abscess without bleeding: Secondary | ICD-10-CM | POA: Diagnosis not present

## 2023-10-04 DIAGNOSIS — K648 Other hemorrhoids: Secondary | ICD-10-CM | POA: Diagnosis not present

## 2023-10-04 DIAGNOSIS — K6389 Other specified diseases of intestine: Secondary | ICD-10-CM | POA: Diagnosis not present

## 2023-10-04 DIAGNOSIS — Q439 Congenital malformation of intestine, unspecified: Secondary | ICD-10-CM

## 2023-10-04 HISTORY — PX: COLONOSCOPY: SHX5424

## 2023-10-04 HISTORY — PX: ESOPHAGOGASTRODUODENOSCOPY: SHX5428

## 2023-10-04 HISTORY — PX: ESOPHAGEAL DILATION: SHX303

## 2023-10-04 LAB — HEMOGLOBIN AND HEMATOCRIT, BLOOD
HCT: 35.4 % — ABNORMAL LOW (ref 36.0–46.0)
Hemoglobin: 11.5 g/dL — ABNORMAL LOW (ref 12.0–15.0)

## 2023-10-04 LAB — BASIC METABOLIC PANEL WITH GFR
Anion gap: 8 (ref 5–15)
BUN: 16 mg/dL (ref 8–23)
CO2: 23 mmol/L (ref 22–32)
Calcium: 8.7 mg/dL — ABNORMAL LOW (ref 8.9–10.3)
Chloride: 111 mmol/L (ref 98–111)
Creatinine, Ser: 1.35 mg/dL — ABNORMAL HIGH (ref 0.44–1.00)
GFR, Estimated: 39 mL/min — ABNORMAL LOW (ref >60)
Glucose, Bld: 96 mg/dL (ref 70–99)
Potassium: 4 mmol/L (ref 3.5–5.1)
Sodium: 142 mmol/L (ref 135–145)

## 2023-10-04 LAB — CBC
HCT: 31.8 % — ABNORMAL LOW (ref 36.0–46.0)
Hemoglobin: 10.1 g/dL — ABNORMAL LOW (ref 12.0–15.0)
MCH: 30.8 pg (ref 26.0–34.0)
MCHC: 31.8 g/dL (ref 30.0–36.0)
MCV: 97 fL (ref 80.0–100.0)
Platelets: 215 10*3/uL (ref 150–400)
RBC: 3.28 MIL/uL — ABNORMAL LOW (ref 3.87–5.11)
RDW: 14 % (ref 11.5–15.5)
WBC: 5 10*3/uL (ref 4.0–10.5)
nRBC: 0 % (ref 0.0–0.2)

## 2023-10-04 LAB — FERRITIN: Ferritin: 66 ng/mL (ref 11–307)

## 2023-10-04 LAB — GLUCOSE, CAPILLARY: Glucose-Capillary: 141 mg/dL — ABNORMAL HIGH (ref 70–99)

## 2023-10-04 LAB — IRON AND TIBC
Iron: 73 ug/dL (ref 28–170)
Saturation Ratios: 26 % (ref 10.4–31.8)
TIBC: 285 ug/dL (ref 250–450)
UIBC: 212 ug/dL

## 2023-10-04 LAB — MRSA NEXT GEN BY PCR, NASAL: MRSA by PCR Next Gen: DETECTED — AB

## 2023-10-04 SURGERY — COLONOSCOPY
Anesthesia: General

## 2023-10-04 MED ORDER — PROPOFOL 10 MG/ML IV BOLUS
INTRAVENOUS | Status: DC | PRN
  Administered 2023-10-04: 20 mg via INTRAVENOUS
  Administered 2023-10-04: 50 mg via INTRAVENOUS
  Administered 2023-10-04 (×2): 20 mg via INTRAVENOUS
  Administered 2023-10-04 (×2): 30 mg via INTRAVENOUS
  Administered 2023-10-04 (×2): 20 mg via INTRAVENOUS
  Administered 2023-10-04: 30 mg via INTRAVENOUS
  Administered 2023-10-04 (×4): 20 mg via INTRAVENOUS
  Administered 2023-10-04 (×2): 30 mg via INTRAVENOUS
  Administered 2023-10-04: 10 mg via INTRAVENOUS
  Administered 2023-10-04 (×2): 20 mg via INTRAVENOUS
  Administered 2023-10-04: 10 mg via INTRAVENOUS
  Administered 2023-10-04 (×4): 20 mg via INTRAVENOUS
  Administered 2023-10-04: 30 mg via INTRAVENOUS
  Administered 2023-10-04: 20 mg via INTRAVENOUS

## 2023-10-04 MED ORDER — EPHEDRINE 5 MG/ML INJ
INTRAVENOUS | Status: AC
  Filled 2023-10-04: qty 5

## 2023-10-04 MED ORDER — MUPIROCIN 2 % EX OINT
TOPICAL_OINTMENT | Freq: Two times a day (BID) | CUTANEOUS | Status: DC
  Filled 2023-10-04 (×4): qty 22

## 2023-10-04 MED ORDER — EPHEDRINE SULFATE (PRESSORS) 50 MG/ML IJ SOLN
INTRAMUSCULAR | Status: DC | PRN
Start: 2023-10-04 — End: 2023-10-04
  Administered 2023-10-04 (×4): 5 mg via INTRAVENOUS

## 2023-10-04 MED ORDER — PHENYLEPHRINE 80 MCG/ML (10ML) SYRINGE FOR IV PUSH (FOR BLOOD PRESSURE SUPPORT)
PREFILLED_SYRINGE | INTRAVENOUS | Status: AC
  Filled 2023-10-04: qty 10

## 2023-10-04 MED ORDER — PROPOFOL 10 MG/ML IV BOLUS
INTRAVENOUS | Status: AC
  Filled 2023-10-04: qty 20

## 2023-10-04 MED ORDER — PHENYLEPHRINE HCL (PRESSORS) 10 MG/ML IV SOLN
INTRAVENOUS | Status: DC | PRN
  Administered 2023-10-04 (×2): 80 ug via INTRAVENOUS

## 2023-10-04 MED ORDER — LACTATED RINGERS IV SOLN: INTRAVENOUS | Status: DC | PRN

## 2023-10-04 MED ORDER — CHLORHEXIDINE GLUCONATE CLOTH 2 % EX PADS: 6.0000 | MEDICATED_PAD | Freq: Every day | CUTANEOUS | Status: DC

## 2023-10-04 NOTE — Progress Notes (Signed)
 We will proceed with EGD and colonoscopy as scheduled.  I thoroughly discussed with the patient the procedure, including the risks involved. Patient understands what the procedure involves including the benefits and any risks. Patient understands alternatives to the proposed procedure. Risks including (but not limited to) bleeding, tearing of the lining (perforation), rupture of adjacent organs, problems with heart and lung function, infection, and medication reactions. A small percentage of complications may require surgery, hospitalization, repeat endoscopic procedure, and/or transfusion.  Patient understood and agreed.  Katrinka Blazing, MD Gastroenterology and Hepatology [facility]

## 2023-10-04 NOTE — Progress Notes (Signed)
 Patient to room from PACU. Vital signs obtained. MD notified of rectal temperature.   10/04/23 1308  Vitals  Temp (!) 94.5 F (34.7 C)  Temp Source Rectal  BP (!) 183/57  MAP (mmHg) 93  BP Location Right Arm  BP Method Automatic  Patient Position (if appropriate) Lying  Pulse Rate (!) 51  Pulse Rate Source Dinamap  Resp 16  Level of Consciousness  Level of Consciousness Alert  Oxygen Therapy  SpO2 100 %  O2 Device Room Air  Pain Assessment  Pain Scale 0-10  Pain Score 0  Provider Notification  Provider Name/Title Emokpae  Date Provider Notified 10/04/23  Time Provider Notified 1312  Method of Notification Page

## 2023-10-04 NOTE — Progress Notes (Signed)
 PROGRESS NOTE Susan Bennett  AVW:[id number] DOB: 07/03/1940 DOA: 09/30/2023 PCP: Theoplis Fix, MD  Brief Narrative/Hospital Course:  83 y.o. female with medical history significant of CKD stage IIIb, dementia, prior stroke who presented due to single episode of hematochezia.  Patient was in the bathroom at living facility and nursing had cleaned her rectum with noted bright red blood.  She was brought to the ER for further assessment.  On arrival she was afebrile and hemodynamically stable.  Labs were obtained which showed creatinine 1.52, AST 12, ALT 13, alkaline phosphatase 135, WBC 6.3, hemoglobin 10.6.  Patient underwent chest x-ray which showed no acute findings. Patient was admitted for further workup.  Spoke with family who requested overnight observation due to concern for recurrent bleeding. Overnight remained afebrile BP on higher side 170s-hemodynamically stable, repeat hemoglobin 9.4 g from 10.6, b/l HB ~1 GM IN 12/23/22.  Repeat hemoglobin has been stable.  Discussed plan of care with patient's daughter they are concerned about patient's swallowing,  SlP consulted-after evaluation diet by dysphagia to find chopped with thin liquid:  Subjective:    After EGD and colonoscopy on 10/04/23 with sedation patient was found to be hypothermic with rectal temperature around 94 and heart rate in the high 40s -- Patient received extra blankets--- with the passage of time temperature and heart rate improved - Will transfer to stepdown unit just in case patient needs warming blanket -- Patient's Selestino Dakin and  patient's daughter Bernardo Bridgeman at bedside, questions answered  A/p 1)Hematochezia single episode--Acute on chronic chronic anemia: -No evidence of iron deficiency per Lab workup (ferritin, serum iron TIBC and iron saturation WNL) -Check B12 and folate in a.m. -on 10/04/23 patient underwent COLONOSCOPY FINDINGS: - A single non-bleeding colonic angioectasia.  - One 15 mm polyp in the cecum.  Biopsied.  - Diverticulosis in the sigmoid colon and in the descending colon.  - Congested mucosa in the sigmoid colon. This made the advancement of the scope very difficult. - Non-bleeding internal hemorrhoids.  -- Await pathology results.  - Repeat colonoscopy for surveillance based on pathology results.  GI service plans to discuss benefits versus risks of repeating colonoscopy. - Per GI team okay to restart Plavix  on 10/05/2023  2)Post sedation/postprocedural hypothermia with corresponding bradycardia -- After EGD and colonoscopy on 10/04/23 with sedation patient was found to be hypothermic with rectal temperature around 94 and heart rate in the high 40s -- Patient received extra blankets--- with the passage of time temperature and heart rate improved - Keep patient in stepdown unit in case she needs a warming blanket  3)Dysphagia: Slp eval requested and advised dysphagia diet.   Underwent EGD on 10/21/2023---findings include-- Low-grade of narrowing Schatzki ring, Dilated.  - 4 cm hiatal hernia. - Normal stomach. - Normal examined duodenum  Hypertension----primarily systolic hypertension BP labile at times poorly controlled, -Resume PTA benazepril  -Use IV hydralazine  as needed elevated BP  GAD: continue BuSpar    History of stroke: continue Plavix   Depression Cognitive impairment: Stable, continue Namenda  and her Zoloft , BuSpar , doxepin    HLD: continue Lipitor   DVT prophylaxis: SCDs Start: 09/30/23 2232 Code Status:   Code Status: Full Code Family Communication: plan of care discussed with patient/daughter Patient status is: Remains hospitalized because of severity of illness Level of care: Stepdown   Dispo: The patient is from: home            Anticipated disposition: TBD  Objective: Vitals last 24 hrs: Vitals:   10/04/23 1230 10/04/23 1308 10/04/23 1331  10/04/23 1339  BP: (!) 166/89  (!) 183/57   Pulse: (!) 51 (!) 51    Resp: 17 16    Temp:  (!) 94.5 F (34.7  C)  (!) 95.3 F (35.2 C)  TempSrc:  Rectal  Rectal  SpO2: 98% 100%    Weight:    54.8 kg  Height:    5\' 5"  (1.651 m)   Physical Exam  Gen:- Awake Alert, frail and elderly appearing, in no acute distress  HEENT:- .AT, No sclera icterus Neck-Supple Neck,No JVD,.  Lungs-  CTAB , fair air movement bilaterally  CV- S1, S2 normal, RRR Abd-  +ve B.Sounds, Abd Soft, No tenderness,    Extremity/Skin:- No  edema,   good pedal pulses  Psych-affect is appropriate, oriented x3 Neuro-no new focal deficits, no tremors   Data Reviewed: I have personally reviewed following labs and imaging studies ( see epic result tab) CBC: Recent Labs  Lab 09/30/23 1933 10/01/23 0400 10/01/23 0942 10/02/23 0954 10/03/23 0641 10/04/23 0442  WBC 6.3 5.5 5.6 6.1 5.6 5.0  NEUTROABS 4.3  --   --  3.9  --   --   HGB 10.6* 9.4* 10.1* 11.1* 9.9* 10.1*  HCT 34.4* 30.3* 32.4* 34.9* 29.6* 31.8*  MCV 99.7 98.7 99.4 98.6 97.4 97.0  PLT 219 214 223 225 209 215   CMP: Recent Labs  Lab 09/30/23 1933 10/04/23 0442  NA 140 142  K 4.0 4.0  CL 113* 111  CO2 22 23  GLUCOSE 146* 96  BUN 23 16  CREATININE 1.52* 1.35*  CALCIUM  8.5* 8.7*   GFR: Estimated Creatinine Clearance: 26.8 mL/min (A) (by C-G formula based on SCr of 1.35 mg/dL (H)). Recent Labs  Lab 09/30/23 1933  AST 12*  ALT 13  ALKPHOS 135*  BILITOT <0.2  PROT 6.0*  ALBUMIN 2.8*   Coagulation Profile: No results for input(s): "INR", "PROTIME" in the last 168 hours. Unresulted Labs (From admission, onward)     Start     Ordered   10/05/23 0500  Hemoglobin and hematocrit, blood  Daily,   R     Question:  Specimen collection method  Answer:  Lab=Lab collect   10/04/23 1342   10/04/23 1600  Hemoglobin and hematocrit, blood  Once-Timed,   TIMED       Question:  Specimen collection method  Answer:  Lab=Lab collect   10/04/23 1342   10/04/23 1326  MRSA Next Gen by PCR, Nasal  Once,   R        10/04/23 1326   10/04/23 0500  CBC  Daily,   R       10/03/23 1136   10/04/23 0500  Basic metabolic panel with GFR  Daily,   R      10/03/23 1136           Antimicrobials/Microbiology: Anti-infectives (From admission, onward)    None      No results found for: "SDES", "SPECREQUEST", "CULT", "REPTSTATUS"  Procedures: Procedure(s) (LRB): COLONOSCOPY (N/A) EGD (ESOPHAGOGASTRODUODENOSCOPY) (N/A) DILATION, ESOPHAGUS (N/A) Medications reviewed: Scheduled Meds:  atorvastatin   20 mg Oral Daily   benazepril   40 mg Oral Daily   busPIRone   7.5 mg Oral BID   [START ON 10/05/2023] Chlorhexidine Gluconate Cloth  6 each Topical Q0600   clopidogrel   75 mg Oral Daily   gabapentin   200 mg Oral QHS   memantine   10 mg Oral BID   mirtazapine   15 mg Oral QHS   pantoprazole  (PROTONIX ) IV  40 mg Intravenous Q12H   sertraline   50 mg Oral Daily   Continuous Infusions:  Colin Dawley, MD [facility] 10/04/2023, 1:44 PM

## 2023-10-04 NOTE — Op Note (Signed)
 [facility] Patient Name: Susan Bennett Procedure Date: 10/04/2023 9:46 AM MRN: [id number] Date of Birth: 25-Feb-1941 Attending MD: Samantha Cress , , [id number] CSN: [id number] Age: 83 Admit Type: Inpatient Procedure:                Colonoscopy Indications:              Rectal bleeding Providers:                Samantha Cress, Pasco Bond, RN, [place] Butter, Technician Referring MD:              Medicines:                Monitored Anesthesia Care Complications:            No immediate complications Estimated Blood Loss:     Estimated blood loss: none. Procedure:                Pre-Anesthesia Assessment:                           - Prior to the procedure, a History and Physical                            was performed, and patient medications, allergies                            and sensitivities were reviewed. The patient's                            tolerance of previous anesthesia was reviewed.                           - The risks and benefits of the procedure and the                            sedation options and risks were discussed with the                            patient. All questions were answered and informed                            consent was obtained.                           - ASA Grade Assessment: III - A patient with severe                            systemic disease.                           After obtaining informed consent, the colonoscope                            was passed under direct vision. Throughout the  procedure, the patient's blood pressure, pulse, and                            oxygen saturations were monitored continuously. The                            PCF-HQ190L ([id number]) scope was introduced through                            the anus and advanced to the the sigmoid colon. The                            colonoscopy was extremely difficult due to a                             tortuous colon. Successful completion of the                            procedure was aided by withdrawing the scope and                            replacing with the pediatric endoscope. The patient                            tolerated the procedure fairly well. The                            [id number]) scope was introduced through                            the anus and advanced to the the cecum, identified                            by appendiceal orifice and ileocecal valve.                           22 Modifier was used because the colonsocopy took 1                            hour and 22 minutes given severe tortuosity of the                            colon. Scope In: 10:25:15 AM Scope Out: 11:47:21 AM Scope Withdrawal Time: 0 hours 7 minutes 58 seconds  Total Procedure Duration: 1 hour 22 minutes 6 seconds  Findings:      The perianal and digital rectal examinations were normal.      A single large localized angioectasia without bleeding was found in the       cecum.      A 15 mm polyp was found in the cecum. The polyp was Paris classification       mixed IIa + IIc (superficial elevation with central depression).       Biopsies were taken with a cold forceps for histology.      Multiple  large-mouthed and small-mouthed diverticula were found in the       sigmoid colon and descending colon.      An area of congested mucosa was found in the sigmoid colon. This made       the advancement of the scope very difficult.Had to switch to ultra slim       enteroscope to pass scope.      Non-bleeding internal hemorrhoids were found during retroflexion. The       hemorrhoids were small.      Note: bleeding possibly coming from diverticular bleeding. Impression:               - A single non-bleeding colonic angioectasia.                           - One 15 mm polyp in the cecum. Biopsied.                           - Diverticulosis in the sigmoid colon and in the                             descending colon.                           - Congested mucosa in the sigmoid colon. This made                            the advancement of the scope very difficult. Had to                            switch to ultra slim enteroscope to pass scope.                           - Non-bleeding internal hemorrhoids. Moderate Sedation:      Per Anesthesia Care Recommendation:           - Return patient to hospital ward for ongoing care.                           - Resume previous diet.                           - Await pathology results.                           - Repeat colonoscopy for surveillance based on                            pathology results. Will need to discuss benefits                            versus risks of repeating colonoscopy.                           - May restart Plavix  tomorrow. Procedure Code(s):        --- Professional ---  14782, Colonoscopy, flexible; with biopsy, single                            or multiple Diagnosis Code(s):        --- Professional ---                           K55.20, Angiodysplasia of colon without hemorrhage                           D12.0, Benign neoplasm of cecum                           K64.8, Other hemorrhoids                           K63.89, Other specified diseases of intestine                           K62.5, Hemorrhage of anus and rectum                           K57.30, Diverticulosis of large intestine without                            perforation or abscess without bleeding CPT copyright 2022 American Medical Association. All rights reserved. The codes documented in this report are preliminary and upon coder review may  be revised to meet current compliance requirements. Samantha Cress, MD Samantha Cress,  10/04/2023 12:11:20 PM This report has been signed electronically. Number of Addenda: 0

## 2023-10-04 NOTE — Brief Op Note (Addendum)
 09/30/2023 - 10/04/2023  10:22 AM  PATIENT:  Almetta Jacquet  83 y.o. female  PRE-OPERATIVE DIAGNOSIS:  rectal bleeding, dysphagia  POST-OPERATIVE DIAGNOSIS:  * No post-op diagnosis entered *  PROCEDURE:  Procedure(s): COLONOSCOPY (N/A) EGD (ESOPHAGOGASTRODUODENOSCOPY) (N/A) DILATION, ESOPHAGUS (N/A)  SURGEON:  Surgeons and Role:    * Urban Garden, MD - Primary  Patient underwent EGD and colonoscopy under propofol sedation.  Tolerated the procedure adequately.   EGD FINDINGS: - Low-grade of narrowing Schatzki ring.   Dilated.  - 4 cm hiatal hernia.  - Normal stomach.  - Normal examined duodenum.   COLONOSCOPY FINDINGS: - A single non-bleeding colonic angioectasia.  - One 15 mm polyp in the cecum.  Biopsied.  - Diverticulosis in the sigmoid colon and in the descending colon.  - Congested mucosa in the sigmoid colon. This made the advancement of the scope very difficult. - Non-bleeding internal hemorrhoids.   RECOMMENDATIONS - Return patient to hospital ward for ongoing care.  - Resume previous diet.  - Use Protonix  (pantoprazole ) 40 mg PO daily.  - Await pathology results.  - Repeat colonoscopy for surveillance based on pathology results. Will need to discuss benefits versus risks of repeating colonoscopy. - May restart Plavix  tomorrow.  Samantha Cress, MD Gastroenterology and Hepatology [facility] Gastroenterology

## 2023-10-04 NOTE — Plan of Care (Signed)

## 2023-10-04 NOTE — Transfer of Care (Signed)
 Immediate Anesthesia Transfer of Care Note  Patient: JAZIYA VANDEPOL  Procedure(s) Performed: COLONOSCOPY EGD (ESOPHAGOGASTRODUODENOSCOPY) DILATION, ESOPHAGUS  Patient Location: PACU  Anesthesia Type:General  Level of Consciousness: sedated  Airway & Oxygen Therapy: Patient connected to nasal cannula oxygen  Post-op Assessment: Post -op Vital signs reviewed and stable  Post vital signs: Reviewed  Last Vitals:  Vitals Value Taken Time  BP 148/61 10/04/23 1154  Temp    Pulse 56 10/04/23 1158  Resp 15 10/04/23 1158  SpO2 98 % 10/04/23 1158  Vitals shown include unfiled device data.  Last Pain:  Vitals:   10/04/23 0800  TempSrc:   PainSc: 0-No pain         Complications: There were no known notable events for this encounter.

## 2023-10-04 NOTE — Anesthesia Preprocedure Evaluation (Addendum)
 Anesthesia Evaluation  Patient identified by MRN, date of birth, ID band Patient awake    Reviewed: Allergy  & Precautions, H&P , NPO status , Patient's Chart, lab work & pertinent test results, reviewed documented beta blocker date and time   Airway Mallampati: II  TM Distance: >3 FB Neck ROM: full    Dental  (+) Edentulous Upper, Edentulous Lower   Pulmonary Current Smoker Only occasional smoker   Pulmonary exam normal breath sounds clear to auscultation       Cardiovascular Exercise Tolerance: Good hypertension, Normal cardiovascular exam Rhythm:regular Rate:Normal     Neuro/Psych    Depression   Dementia TIA negative psych ROS   GI/Hepatic negative GI ROS, Neg liver ROS,,,Rectal bleeding and esophageal dysphagia   Endo/Other  negative endocrine ROS    Renal/GU CRFRenal diseaseStage 3 CKD  negative genitourinary   Musculoskeletal   Abdominal   Peds  Hematology  (+) Blood dyscrasia, anemia Hgb 10   Anesthesia Other Findings   Reproductive/Obstetrics negative OB ROS                             Anesthesia Physical Anesthesia Plan  ASA: 3  Anesthesia Plan: General   Post-op Pain Management: Minimal or no pain anticipated   Induction: Intravenous  PONV Risk Score and Plan: Propofol infusion  Airway Management Planned: Nasal Cannula and Natural Airway  Additional Equipment: None  Intra-op Plan:   Post-operative Plan:   Informed Consent: I have reviewed the patients History and Physical, chart, labs and discussed the procedure including the risks, benefits and alternatives for the proposed anesthesia with the patient or authorized representative who has indicated his/her understanding and acceptance.     Dental Advisory Given  Plan Discussed with: CRNA  Anesthesia Plan Comments: (Consent from daughter)       Anesthesia Quick Evaluation

## 2023-10-04 NOTE — Anesthesia Postprocedure Evaluation (Signed)
 Anesthesia Post Note  Patient: Susan Bennett  Procedure(s) Performed: COLONOSCOPY EGD (ESOPHAGOGASTRODUODENOSCOPY) DILATION, ESOPHAGUS  Patient location during evaluation: PACU Anesthesia Type: General Level of consciousness: awake and alert Pain management: pain level controlled Vital Signs Assessment: post-procedure vital signs reviewed and stable Respiratory status: spontaneous breathing, nonlabored ventilation, respiratory function stable and patient connected to nasal cannula oxygen Cardiovascular status: blood pressure returned to baseline and stable Postop Assessment: no apparent nausea or vomiting Anesthetic complications: no   There were no known notable events for this encounter.   Last Vitals:  Vitals:   10/04/23 1215 10/04/23 1230  BP: (!) 144/82 (!) 166/89  Pulse: (!) 52 (!) 51  Resp: (!) 9 17  Temp:    SpO2: 100% 98%    Last Pain:  Vitals:   10/04/23 1230  TempSrc:   PainSc: 0-No pain                 Emre Stock L Pilar Westergaard

## 2023-10-04 NOTE — Op Note (Signed)
 [facility] Patient Name: Susan Bennett Procedure Date: 10/04/2023 9:47 AM MRN: [id number] Date of Birth: 1940-11-26 Attending MD: Samantha Cress , , [id number] CSN: [id number] Age: 83 Admit Type: Inpatient Procedure:                Upper GI endoscopy Indications:              Dysphagia Providers:                Samantha Cress, Pasco Bond, RN Referring MD:              Medicines:                Monitored Anesthesia Care Complications:            No immediate complications. Estimated Blood Loss:     Estimated blood loss: none. Procedure:                Pre-Anesthesia Assessment:                           - Prior to the procedure, a History and Physical                            was performed, and patient medications, allergies                            and sensitivities were reviewed. The patient's                            tolerance of previous anesthesia was reviewed.                           - The risks and benefits of the procedure and the                            sedation options and risks were discussed with the                            patient. All questions were answered and informed                            consent was obtained.                           - ASA Grade Assessment: II - A patient with mild                            systemic disease.                           After obtaining informed consent, the endoscope was                            passed under direct vision. Throughout the                            procedure, the patient's blood pressure, pulse, and  oxygen saturations were monitored continuously. The                            [id number]-H190 ([id number]) scope was introduced through the                            mouth, and advanced to the second part of duodenum.                            The upper GI endoscopy was accomplished without                            difficulty. The patient tolerated the procedure                             well. Scope In: 10:05:53 AM Scope Out: 10:18:04 AM Total Procedure Duration: 0 hours 12 minutes 11 seconds  Findings:      A low-grade of narrowing Schatzki ring was found at the gastroesophageal       junction. A TTS dilator was passed through the scope, but the lesion       could not be dilated as there was balloon equipment dysfunction.       Dilation with an 18-19-20 mm balloon dilator was attempted to 18 mm. A       guidewire was placed and the scope was withdrawn. Due to this, dilation       was attempted again with a Savary dilator with mild resistance at 18 mm.       The dilation site was examined following endoscope reinsertion and       showed mild mucosal disruption.      A 4 cm hiatal hernia was present.      The stomach was normal.      The examined duodenum was normal. Impression:               - Low-grade of narrowing Schatzki ring. Dilated.                           - 4 cm hiatal hernia.                           - Normal stomach.                           - Normal examined duodenum.                           - No specimens collected. Moderate Sedation:      Per Anesthesia Care Recommendation:           - Return patient to hospital ward for ongoing care.                           - Resume previous diet.                           - Use Protonix  (pantoprazole ) 40 mg PO daily. Procedure Code(s):        --- Professional ---  [phone], Esophagogastroduodenoscopy, flexible,                            transoral; with insertion of guide wire followed by                            passage of dilator(s) through esophagus over guide                            wire                           43249, Esophagogastroduodenoscopy, flexible,                            transoral; with transendoscopic balloon dilation of                            esophagus (less than 30 mm diameter) Diagnosis Code(s):        --- Professional ---                            K22.2, Esophageal obstruction                           K44.9, Diaphragmatic hernia without obstruction or                            gangrene                           R13.10, Dysphagia, unspecified CPT copyright 2022 American Medical Association. All rights reserved. The codes documented in this report are preliminary and upon coder review may  be revised to meet current compliance requirements. Samantha Cress, MD Samantha Cress,  10/04/2023 11:55:15 AM This report has been signed electronically. Number of Addenda: 0

## 2023-10-04 NOTE — Addendum Note (Signed)
 Addendum  created 10/04/23 1631 by Araceli Knight, MD   Clinical Note Signed, Flowsheet accepted

## 2023-10-04 NOTE — Addendum Note (Signed)
 Addendum  created 10/04/23 1643 by Araceli Knight, MD   Flowsheet accepted

## 2023-10-04 NOTE — Progress Notes (Signed)
 Was notified by primary nurse patient returned from PACU with low temp and elevated BP as well as decreased HR. Notified Dr. Josiah Nigh; order for [facility] unit obtained. Notified AC. Patient moved to [facility] by primary nurse.

## 2023-10-05 ENCOUNTER — Telehealth (INDEPENDENT_AMBULATORY_CARE_PROVIDER_SITE_OTHER): Payer: Self-pay | Admitting: Gastroenterology

## 2023-10-05 DIAGNOSIS — D649 Anemia, unspecified: Secondary | ICD-10-CM | POA: Diagnosis not present

## 2023-10-05 DIAGNOSIS — K625 Hemorrhage of anus and rectum: Secondary | ICD-10-CM | POA: Diagnosis not present

## 2023-10-05 DIAGNOSIS — K5520 Angiodysplasia of colon without hemorrhage: Secondary | ICD-10-CM

## 2023-10-05 DIAGNOSIS — R1310 Dysphagia, unspecified: Secondary | ICD-10-CM | POA: Diagnosis not present

## 2023-10-05 DIAGNOSIS — K921 Melena: Secondary | ICD-10-CM | POA: Diagnosis not present

## 2023-10-05 LAB — VITAMIN B12: Vitamin B-12: 239 pg/mL (ref 180–914)

## 2023-10-05 LAB — CBC
HCT: 30.8 % — ABNORMAL LOW (ref 36.0–46.0)
Hemoglobin: 10 g/dL — ABNORMAL LOW (ref 12.0–15.0)
MCH: 31.4 pg (ref 26.0–34.0)
MCHC: 32.5 g/dL (ref 30.0–36.0)
MCV: 96.9 fL (ref 80.0–100.0)
Platelets: 233 10*3/uL (ref 150–400)
RBC: 3.18 MIL/uL — ABNORMAL LOW (ref 3.87–5.11)
RDW: 14.2 % (ref 11.5–15.5)
WBC: 6.3 10*3/uL (ref 4.0–10.5)
nRBC: 0 % (ref 0.0–0.2)

## 2023-10-05 LAB — BASIC METABOLIC PANEL WITH GFR
Anion gap: 9 (ref 5–15)
BUN: 18 mg/dL (ref 8–23)
CO2: 20 mmol/L — ABNORMAL LOW (ref 22–32)
Calcium: 8.2 mg/dL — ABNORMAL LOW (ref 8.9–10.3)
Chloride: 108 mmol/L (ref 98–111)
Creatinine, Ser: 1.64 mg/dL — ABNORMAL HIGH (ref 0.44–1.00)
GFR, Estimated: 31 mL/min — ABNORMAL LOW (ref >60)
Glucose, Bld: 95 mg/dL (ref 70–99)
Potassium: 3.8 mmol/L (ref 3.5–5.1)
Sodium: 137 mmol/L (ref 135–145)

## 2023-10-05 LAB — FOLATE: Folate: 4.1 ng/mL — ABNORMAL LOW (ref >5.9)

## 2023-10-05 MED ORDER — HYDRALAZINE HCL 20 MG/ML IJ SOLN
10.0000 mg | Freq: Four times a day (QID) | INTRAMUSCULAR | Status: DC | PRN
  Administered 2023-10-05: 10 mg via INTRAVENOUS
  Filled 2023-10-05: qty 1

## 2023-10-05 MED ORDER — VITAMIN B-12 1000 MCG PO TABS
1000.0000 ug | ORAL_TABLET | Freq: Every day | ORAL | Status: DC
  Administered 2023-10-05 – 2023-10-08 (×4): 1000 ug via ORAL
  Filled 2023-10-05 (×4): qty 1

## 2023-10-05 MED ORDER — AMLODIPINE BESYLATE 5 MG PO TABS
5.0000 mg | ORAL_TABLET | Freq: Every day | ORAL | Status: DC
  Administered 2023-10-05: 5 mg via ORAL
  Filled 2023-10-05 (×2): qty 1

## 2023-10-05 MED ORDER — FOLIC ACID 1 MG PO TABS
1.0000 mg | ORAL_TABLET | Freq: Every day | ORAL | Status: DC
  Administered 2023-10-05 – 2023-10-08 (×4): 1 mg via ORAL
  Filled 2023-10-05 (×4): qty 1

## 2023-10-05 MED ORDER — CLOPIDOGREL BISULFATE 75 MG PO TABS
75.0000 mg | ORAL_TABLET | Freq: Every day | ORAL | Status: DC
  Administered 2023-10-06 – 2023-10-08 (×3): 75 mg via ORAL
  Filled 2023-10-05 (×3): qty 1

## 2023-10-05 NOTE — Telephone Encounter (Signed)
 Hi Amanda,  Can you please schedule a follow up appointment for this patient in 3-4 weeks with Dr. Riley Cheadle or any of the APPs?  Thanks,  Samantha Cress, MD Gastroenterology and Hepatology [facility] Gastroenterology

## 2023-10-05 NOTE — Progress Notes (Signed)
 Susan Bennett, M.D. Gastroenterology & Hepatology   Interval History:  No acute events overnight. Had 2 bowel movements yesterday with very scant amount of blood in one of her bowel movements.  No nausea, vomiting, fever, chills, melena. Hemoglobin downtrend to 10.0, but overall stable compared to the last 72 hours.  Inpatient Medications:  Current Facility-Administered Medications:    acetaminophen  (TYLENOL ) tablet 650 mg, 650 mg, Oral, Q6H PRN **OR** acetaminophen  (TYLENOL ) suppository 650 mg, 650 mg, Rectal, Q6H PRN, Myrl Askew, MD   atorvastatin  (LIPITOR) tablet 20 mg, 20 mg, Oral, Daily, Dorrell, Robert, MD, 20 mg at 10/03/23 1338   benazepril  (LOTENSIN ) tablet 40 mg, 40 mg, Oral, Daily, Dorrell, Robert, MD, 40 mg at 10/04/23 1435   busPIRone  (BUSPAR ) tablet 7.5 mg, 7.5 mg, Oral, BID, Dorrell, Robert, MD, 7.5 mg at 10/04/23 2119   Chlorhexidine Gluconate Cloth 2 % PADS 6 each, 6 each, Topical, Q0600, Susan, Courage, MD   clopidogrel  (PLAVIX ) tablet 75 mg, 75 mg, Oral, Daily, Dorrell, Robert, MD, 75 mg at 10/03/23 1338   gabapentin  (NEURONTIN ) capsule 200 mg, 200 mg, Oral, QHS, Dorrell, Robert, MD, 200 mg at 10/04/23 2119   hydrALAZINE  (APRESOLINE ) injection 5 mg, 5 mg, Intravenous, Q6H PRN, Bobbetta Burnet, Ramesh, MD, 5 mg at 10/04/23 1436   memantine  (NAMENDA ) tablet 10 mg, 10 mg, Oral, BID, Dorrell, Robert, MD, 10 mg at 10/04/23 2119   mirtazapine  (REMERON ) tablet 15 mg, 15 mg, Oral, QHS, Kc, Ramesh, MD, 15 mg at 10/04/23 2119   mupirocin ointment (BACTROBAN) 2 %, , Nasal, BID, Susan, Courage, MD, Given at 10/04/23 2119   ondansetron  (ZOFRAN ) tablet 4 mg, 4 mg, Oral, Q6H PRN **OR** ondansetron  (ZOFRAN ) injection 4 mg, 4 mg, Intravenous, Q6H PRN, Myrl Askew, MD   oxyCODONE  (Oxy IR/ROXICODONE ) immediate release tablet 5 mg, 5 mg, Oral, Q4H PRN, Dorrell, Robert, MD   pantoprazole  (PROTONIX ) injection 40 mg, 40 mg, Intravenous, Q12H, Kc, Ramesh, MD, 40 mg at 10/04/23 2119    sertraline  (ZOLOFT ) tablet 50 mg, 50 mg, Oral, Daily, Dorrell, Robert, MD, 50 mg at 10/03/23 1338   I/O    Intake/Output Summary (Last 24 hours) at 10/05/2023 0902 Last data filed at 10/05/2023 0600 Gross per 24 hour  Intake 1750 ml  Output --  Net 1750 ml     Physical Exam: Temp:  [94.5 F (34.7 C)-99.4 F (37.4 C)] 98.5 F (36.9 C) (05/04 0737) Pulse Rate:  [51-120] 70 (05/04 0200) Resp:  [9-22] 20 (05/04 0800) BP: (99-187)/(37-89) 174/58 (05/04 0800) SpO2:  [91 %-100 %] 93 % (05/04 0200) Weight:  [54.8 kg] 54.8 kg (05/03 1339)  Temp (24hrs), Avg:97 F (36.1 C), Min:94.5 F (34.7 C), Max:99.4 F (37.4 C)  GENERAL: The patient is awake and alert, in no acute distress. HEENT: Head is normocephalic and atraumatic. EOMI are intact. Mouth is well hydrated and without lesions. NECK: Supple. No masses LUNGS: Clear to auscultation. No presence of rhonchi/wheezing/rales. Adequate chest expansion HEART: RRR, normal s1 and s2. ABDOMEN: Soft, nontender, no guarding, no peritoneal signs, and nondistended. BS +. No masses. EXTREMITIES: Without any cyanosis, clubbing, rash, lesions or edema. NEUROLOGIC:  awake and alert, no focal motor deficit. SKIN: no jaundice, no rashes  Laboratory Data: CBC:     Component Value Date/Time   WBC 6.3 10/05/2023 0359   RBC 3.18 (L) 10/05/2023 0359   HGB 10.0 (L) 10/05/2023 0359   HGB 12.1 05/15/2022 1132   HCT 30.8 (L) 10/05/2023 0359   HCT 36.3 05/15/2022 1132  PLT 233 10/05/2023 0359   PLT 232 08/08/2021 1637   MCV 96.9 10/05/2023 0359   MCV 97 05/15/2022 1132   MCH 31.4 10/05/2023 0359   MCHC 32.5 10/05/2023 0359   RDW 14.2 10/05/2023 0359   RDW 13.8 05/15/2022 1132   LYMPHSABS 1.3 10/02/2023 0954   LYMPHSABS 1.1 05/15/2022 1132   MONOABS 0.5 10/02/2023 0954   EOSABS 0.4 10/02/2023 0954   EOSABS 0.8 (H) 05/15/2022 1132   BASOSABS 0.0 10/02/2023 0954   BASOSABS 0.0 05/15/2022 1132   COAG: No results found for: "INR", "PROTIME"  BMP:      Latest Ref Rng & Units 10/05/2023    3:59 AM 10/04/2023    4:42 AM 09/30/2023    7:33 PM  BMP  Glucose 70 - 99 mg/dL 95  96  161   BUN 8 - 23 mg/dL 18  16  23    Creatinine 0.44 - 1.00 mg/dL 0.96  0.45  4.09   Sodium 135 - 145 mmol/L 137  142  140   Potassium 3.5 - 5.1 mmol/L 3.8  4.0  4.0   Chloride 98 - 111 mmol/L 108  111  113   CO2 22 - 32 mmol/L 20  23  22    Calcium  8.9 - 10.3 mg/dL 8.2  8.7  8.5     HEPATIC:     Latest Ref Rng & Units 09/30/2023    7:33 PM 12/23/2022    5:31 PM 09/24/2022    2:03 PM  Hepatic Function  Total Protein 6.5 - 8.1 g/dL 6.0  5.2  6.1   Albumin 3.5 - 5.0 g/dL 2.8  2.0  3.0   AST 15 - 41 U/L 12  21  17    ALT 0 - 44 U/L 13  15  15    Alk Phosphatase 38 - 126 U/L 135  148  128   Total Bilirubin 0.0 - 1.2 mg/dL <8.1  0.3  0.5     CARDIAC: No results found for: "CKTOTAL", "CKMB", "CKMBINDEX", "TROPONINI"    Imaging: I personally reviewed and interpreted the available labs, imaging and endoscopic files.   Assessment/Plan: Susan Bennett is a 83 y.o. year old female with history of CKD, dementia, HTN who presented from SNF with rectal bleeding.  Also endorsed poor oral intake due to dysphagia.  Patient presented some mild downtrending of her hemoglobin during the current hospitalization and occasional episodes of rectal bleeding.  Due to this, patient underwent EGD and colonoscopy yesterday.  EGD showed presence of low-grade Schatzki's ring which was dilated and a 4 cm hiatal hernia.  Colonoscopy was extremely difficult due to the presence of significant sigmoid congestion, the scope had to be changed to an ultraslim scope.  She was found to have presence of old blood in the left side of her colon, distal to the sigmoid.  Also noted to have a polyp with a depressed center which was biopsied (not attempted to be resected as the patient was on Plavix  while doing the procedure) and presence of a large AVM that did not have any stigmata of recent bleeding.  There was  presence of diverticulosis and hemorrhoids.  Etiology of recent bleeding likely corresponds to diverticular bleeding, less likely due to ischemic colitis as the patient never endorsed having abdominal pain and no leukocytosis was present.  As she has not presented any more clinical bleeding, we will advance her diet and follow-up pathology as outpatient.  If the patient were to have malignancy, we will  need to do staging and discuss possibility of surgery (thorough discussion will need to be held given her history of advanced dementia and age).  If it were to have been benign polyp, may need to discuss repeating a colonoscopy for resection take into consideration benefits versus risks given aforementioned factors and life expectancy.  -Advance diet as tolerated - H&H daily - Can continue Plavix  daily - Follow pathology - Patient will follow up in GI clinic in 3-4 weeks. - GI service will sign-off, please call us  back if you have any more questions.   Susan Cress, MD Gastroenterology and Hepatology [facility] Gastro[facility]ology

## 2023-10-05 NOTE — Progress Notes (Signed)
 PROGRESS NOTE Susan Bennett  NGE:[id number] DOB: 26-Mar-1941 DOA: 09/30/2023 PCP: Theoplis Fix, MD  Brief Narrative/Hospital Course:  83 y.o. female with medical history significant of CKD stage IIIb, dementia, prior stroke who presented due to single episode of hematochezia.  Patient was in the bathroom at living facility and nursing had cleaned her rectum with noted bright red blood.  She was brought to the ER for further assessment.  On arrival she was afebrile and hemodynamically stable.  Labs were obtained which showed creatinine 1.52, AST 12, ALT 13, alkaline phosphatase 135, WBC 6.3, hemoglobin 10.6.  Patient underwent chest x-ray which showed no acute findings. Patient was admitted for further workup.  Spoke with family who requested overnight observation due to concern for recurrent bleeding. Overnight remained afebrile BP on higher side 170s-hemodynamically stable, repeat hemoglobin 9.4 g from 10.6, b/l HB ~1 GM IN 12/23/22.  Repeat hemoglobin has been stable.  Discussed plan of care with patient's daughter they are concerned about patient's swallowing,  SlP consulted-after evaluation diet by dysphagia to find chopped with thin liquid:  Subjective:   patient is now requiring assist of 2 to transfer - May not be able to return to independent living at current level of functioning -I Called and updated patient's daughter, questions answered -- Physical therapy eval requested to determine disposition  A/p 1)Hematochezia single episode--Acute on chronic chronic anemia: -No evidence of iron deficiency per Lab workup (ferritin, serum iron TIBC and iron saturation WNL) -Check B12 and folate in a.m. -on 10/04/23 patient underwent COLONOSCOPY FINDINGS: - A single non-bleeding colonic angioectasia.  - One 15 mm polyp in the cecum. Biopsied.  - Diverticulosis in the sigmoid colon and in the descending colon.  - Congested mucosa in the sigmoid colon. This made the advancement of the scope very  difficult. - Non-bleeding internal hemorrhoids.  -- Await pathology results.  - Repeat colonoscopy for surveillance based on pathology results.  GI service plans to discuss benefits versus risks of repeating colonoscopy. - Per GI team okay to restart Plavix  on 10/05/2023 - Hemoglobin currently stable around 10 - Serum folate is low B12 low normal, give B12 and folate supplements  2)Post sedation/postprocedural hypothermia with corresponding bradycardia -- After EGD and colonoscopy on 10/04/23 with sedation patient was found to be hypothermic with rectal temperature around 94 and heart rate in the high 40s - hypothermia and relative bradycardia as resolved - No further need for warming blankets okay to transfer out of stepdown to telemetry unit  3)Dysphagia: Slp eval requested and advised dysphagia diet.   Underwent EGD on 10/21/2023---findings include-- Low-grade of narrowing Schatzki ring, Dilated.  - 4 cm hiatal hernia. - Normal stomach. - Normal examined duodenum - Continue modified diet with aspiration precautions  4)Generalized weakness and deconditioning----patient is now requiring assist of 2 to transfer - May not be able to return to independent living at current level of functioning -- Physical therapy eval requested to determine disposition  Hypertension----primarily systolic hypertension BP labile at times poorly controlled, -Resume PTA benazepril  -Use IV hydralazine  as needed elevated BP  GAD: continue BuSpar    History of stroke: continue Plavix   Depression Cognitive impairment: Stable, continue Namenda  and her Zoloft , BuSpar , doxepin    HLD: continue Lipitor   DVT prophylaxis: SCDs Start: 09/30/23 2232 Code Status:   Code Status: Full Code Family Communication: plan of care discussed with patient/daughter Patient status is: Remains hospitalized because of severity of illness Level of care: Telemetry   Dispo: The patient is from: home  Anticipated  disposition: TBD--awaiting physical therapy eval  Objective: Vitals last 24 hrs: Vitals:   10/05/23 1200 10/05/23 1421 10/05/23 1430 10/05/23 1544  BP: (!) 142/41 (!) 102/45 113/76 116/64  Pulse:    77  Resp: 20  16 16   Temp:      TempSrc:      SpO2:    96%  Weight:      Height:       Physical Exam  Gen:- Awake Alert, frail and elderly appearing, in no acute distress  HEENT:- [place].AT, No sclera icterus Neck-Supple Neck,No JVD,.  Lungs-  CTAB , fair air movement bilaterally  CV- S1, S2 normal, RRR Abd-  +ve B.Sounds, Abd Soft, No tenderness,    Extremity/Skin:- No  edema,   good pedal pulses  Psych-affect is appropriate, oriented x3 Neuro-generalized weakness, no new focal deficits, no tremors   Data Reviewed: I have personally reviewed following labs and imaging studies ( see epic result tab) CBC: Recent Labs  Lab 09/30/23 1933 10/01/23 0400 10/01/23 0942 10/02/23 0954 10/03/23 0641 10/04/23 0442 10/04/23 1543 10/05/23 0359  WBC 6.3   < > 5.6 6.1 5.6 5.0  --  6.3  NEUTROABS 4.3  --   --  3.9  --   --   --   --   HGB 10.6*   < > 10.1* 11.1* 9.9* 10.1* 11.5* 10.0*  HCT 34.4*   < > 32.4* 34.9* 29.6* 31.8* 35.4* 30.8*  MCV 99.7   < > 99.4 98.6 97.4 97.0  --  96.9  PLT 219   < > 223 225 209 215  --  233   < > = values in this interval not displayed.   CMP: Recent Labs  Lab 09/30/23 1933 10/04/23 0442 10/05/23 0359  NA 140 142 137  K 4.0 4.0 3.8  CL 113* 111 108  CO2 22 23 20*  GLUCOSE 146* 96 95  BUN 23 16 18   CREATININE 1.52* 1.35* 1.64*  CALCIUM  8.5* 8.7* 8.2*   GFR: Estimated Creatinine Clearance: 22.1 mL/min (A) (by C-G formula based on SCr of 1.64 mg/dL (H)). Recent Labs  Lab 09/30/23 1933  AST 12*  ALT 13  ALKPHOS 135*  BILITOT <0.2  PROT 6.0*  ALBUMIN 2.8*   Antimicrobials/Microbiology: Anti-infectives (From admission, onward)    None      No results found for: "SDES", "SPECREQUEST", "CULT", "REPTSTATUS"  Procedures: Procedure(s)  (LRB): COLONOSCOPY (N/A) EGD (ESOPHAGOGASTRODUODENOSCOPY) (N/A) DILATION, ESOPHAGUS (N/A) Medications reviewed: Scheduled Meds:  amLODipine   5 mg Oral Daily   atorvastatin   20 mg Oral Daily   benazepril   40 mg Oral Daily   busPIRone   7.5 mg Oral BID   Chlorhexidine Gluconate Cloth  6 each Topical Q0600   clopidogrel   75 mg Oral Daily   vitamin B-12  1,000 mcg Oral Daily   folic acid  1 mg Oral Daily   gabapentin   200 mg Oral QHS   memantine   10 mg Oral BID   mirtazapine   15 mg Oral QHS   mupirocin ointment   Nasal BID   pantoprazole  (PROTONIX ) IV  40 mg Intravenous Q12H   sertraline   50 mg Oral Daily   Continuous Infusions:  Colin Dawley, MD [facility] 10/05/2023, 4:10 PM

## 2023-10-05 NOTE — Progress Notes (Signed)
Called report to [phone] RN.

## 2023-10-06 ENCOUNTER — Encounter (HOSPITAL_COMMUNITY): Payer: Self-pay | Admitting: Gastroenterology

## 2023-10-06 DIAGNOSIS — K921 Melena: Secondary | ICD-10-CM | POA: Diagnosis not present

## 2023-10-06 LAB — GLUCOSE, CAPILLARY
Glucose-Capillary: 100 mg/dL — ABNORMAL HIGH (ref 70–99)
Glucose-Capillary: 116 mg/dL — ABNORMAL HIGH (ref 70–99)

## 2023-10-06 LAB — CBC
HCT: 32.8 % — ABNORMAL LOW (ref 36.0–46.0)
Hemoglobin: 9.9 g/dL — ABNORMAL LOW (ref 12.0–15.0)
MCH: 29.9 pg (ref 26.0–34.0)
MCHC: 30.2 g/dL (ref 30.0–36.0)
MCV: 99.1 fL (ref 80.0–100.0)
Platelets: 206 10*3/uL (ref 150–400)
RBC: 3.31 MIL/uL — ABNORMAL LOW (ref 3.87–5.11)
RDW: 14.4 % (ref 11.5–15.5)
WBC: 5.8 10*3/uL (ref 4.0–10.5)
nRBC: 0 % (ref 0.0–0.2)

## 2023-10-06 LAB — BASIC METABOLIC PANEL WITH GFR
Anion gap: 7 (ref 5–15)
BUN: 20 mg/dL (ref 8–23)
CO2: 22 mmol/L (ref 22–32)
Calcium: 8.2 mg/dL — ABNORMAL LOW (ref 8.9–10.3)
Chloride: 109 mmol/L (ref 98–111)
Creatinine, Ser: 2 mg/dL — ABNORMAL HIGH (ref 0.44–1.00)
GFR, Estimated: 24 mL/min — ABNORMAL LOW (ref >60)
Glucose, Bld: 105 mg/dL — ABNORMAL HIGH (ref 70–99)
Potassium: 3.9 mmol/L (ref 3.5–5.1)
Sodium: 138 mmol/L (ref 135–145)

## 2023-10-06 MED ORDER — MIRTAZAPINE 15 MG PO TABS: 7.5000 mg | ORAL_TABLET | Freq: Every day | ORAL | Status: DC

## 2023-10-06 MED ORDER — SODIUM CHLORIDE 0.9 % IV SOLN: INTRAVENOUS | Status: AC

## 2023-10-06 MED ORDER — AMLODIPINE BESYLATE 5 MG PO TABS
5.0000 mg | ORAL_TABLET | Freq: Every day | ORAL | Status: DC
  Administered 2023-10-08: 5 mg via ORAL
  Filled 2023-10-06: qty 1

## 2023-10-06 NOTE — Progress Notes (Signed)
 PROGRESS NOTE Susan Bennett  EAV:[id number] DOB: [birth year] DOA: 09/30/2023 PCP: Theoplis Fix, MD  Brief Narrative/Hospital Course:  83 y.o. female with medical history significant of CKD stage IIIb, dementia, prior stroke who presented due to single episode of hematochezia.  Patient was in the bathroom at living facility and nursing had cleaned her rectum with noted bright red blood.  She was brought to the ER for further assessment.  On arrival she was afebrile and hemodynamically stable.  Labs were obtained which showed creatinine 1.52, AST 12, ALT 13, alkaline phosphatase 135, WBC 6.3, hemoglobin 10.6.  Patient underwent chest x-ray which showed no acute findings. Patient was admitted for further workup.  Spoke with family who requested overnight observation due to concern for recurrent bleeding. Overnight remained afebrile BP on higher side 170s-hemodynamically stable, repeat hemoglobin 9.4 g from 10.6, b/l HB ~1 GM IN 12/23/22.  Repeat hemoglobin has been stable.  Discussed plan of care with patient's daughter they are concerned about patient's swallowing,  SlP consulted-after evaluation diet by dysphagia to find chopped with thin liquid:  Subjective:   Fatigue and weakness persist -- Sleepy somewhat today--patient did receive Remeron  overnight -oral intake not great No fever  Or chills   No Nausea, Vomiting or Diarrhea - Called and updated patient's daughter  A/p 1)Hematochezia single episode--Acute on chronic chronic anemia: -No evidence of iron deficiency per Lab workup (ferritin, serum iron TIBC and iron saturation WNL) -Check B12 and folate in a.m. -on 10/04/23 patient underwent COLONOSCOPY FINDINGS: - A single non-bleeding colonic angioectasia.  - One 15 mm polyp in the cecum. Biopsied.  - Diverticulosis in the sigmoid colon and in the descending colon.  - Congested mucosa in the sigmoid colon. This made the advancement of the scope very difficult. - Non-bleeding internal  hemorrhoids.  -- Await pathology results.  - Repeat colonoscopy for surveillance based on pathology results.  GI service plans to discuss benefits versus risks of repeating colonoscopy. - Per GI team okay to restart Plavix  on 10/05/2023 - Hemoglobin currently stable around 10  --Hgb may drop in a.m. due to IV fluids/hemodilution (Got  IV fluids on 10/06/2023) - Serum folate is low B12 low normal, give B12 and folate supplements  2)Post sedation/postprocedural hypothermia with corresponding bradycardia -- After EGD and colonoscopy on 10/04/23 with sedation patient was found to be hypothermic with rectal temperature around 94 and heart rate in the high 40s - hypothermia and relative bradycardia as resolved - No further need for warming blankets okay to transfer out of stepdown to telemetry unit  3)Dysphagia: Slp eval requested and advised dysphagia diet.   Underwent EGD on 10/21/2023---findings include-- Low-grade of narrowing Schatzki ring, Dilated.  - 4 cm hiatal hernia. - Normal stomach. - Normal examined duodenum - Continue modified diet with aspiration precautions - Somewhat sleepy on 10/06/2023 with rather poor oral intake  4)Generalized weakness and deconditioning----patient is now requiring assist of 2 to transfer - May not be able to return to independent living at current level of functioning -- Physical therapy eval requested to determine disposition  Hypertension----primarily systolic hypertension BP labile at times poorly controlled, -Resume PTA benazepril  -Use IV hydralazine  as needed elevated BP  GAD: continue BuSpar    History of stroke: continue Plavix   Depression/Cognitive impairment: Stable, continue Namenda  and her Zoloft , BuSpar , doxepin   on 10/06/23--excessive sleepiness noted okay to stop Remeron  for now  HLD: continue Lipitor   DVT prophylaxis: SCDs Start: 09/30/23 2232 Code Status:   Code Status: Full Code Family  Communication: plan of care discussed with  patient/daughter Patient status is: Remains hospitalized because of severity of illness Level of care: Telemetry   Dispo: The patient is from: home            Anticipated disposition: -Phy Therapy recommends SNF rehab  Objective: Vitals last 24 hrs: Vitals:   10/05/23 1918 10/05/23 2358 10/06/23 1039 10/06/23 1230  BP: (!) 138/49 (!) 101/50 (!) 111/50 110/66  Pulse: 82 84 61 77  Resp: 20 18  16   Temp: 98.9 F (37.2 C) 98.6 F (37 C) 97.9 F (36.6 C) 98.1 F (36.7 C)  TempSrc: Oral Oral Axillary Axillary  SpO2: 93% 92% 94% 96%  Weight:      Height:       Physical Exam  Gen:-Somewhat sleepy, frail and elderly appearing, in no acute distress  HEENT:- [place].AT, No sclera icterus Neck-Supple Neck,No JVD,.  Lungs-  CTAB , fair air movement bilaterally  CV- S1, S2 normal, RRR Abd-  +ve B.Sounds, Abd Soft, No tenderness,    Extremity/Skin:- No  edema,   good pedal pulses  Psych-affect is appropriate, oriented x3 Neuro-generalized weakness, no new focal deficits, no tremors   Data Reviewed: I have personally reviewed following labs and imaging studies ( see epic result tab) CBC: Recent Labs  Lab 09/30/23 1933 10/01/23 0400 10/02/23 0954 10/03/23 0641 10/04/23 0442 10/04/23 1543 10/05/23 0359 10/06/23 0451  WBC 6.3   < > 6.1 5.6 5.0  --  6.3 5.8  NEUTROABS 4.3  --  3.9  --   --   --   --   --   HGB 10.6*   < > 11.1* 9.9* 10.1* 11.5* 10.0* 9.9*  HCT 34.4*   < > 34.9* 29.6* 31.8* 35.4* 30.8* 32.8*  MCV 99.7   < > 98.6 97.4 97.0  --  96.9 99.1  PLT 219   < > 225 209 215  --  233 206   < > = values in this interval not displayed.   CMP: Recent Labs  Lab 09/30/23 1933 10/04/23 0442 10/05/23 0359 10/06/23 0451  NA 140 142 137 138  K 4.0 4.0 3.8 3.9  CL 113* 111 108 109  CO2 22 23 20* 22  GLUCOSE 146* 96 95 105*  BUN 23 16 18 20   CREATININE 1.52* 1.35* 1.64* 2.00*  CALCIUM  8.5* 8.7* 8.2* 8.2*   GFR: Estimated Creatinine Clearance: 18.1 mL/min (A) (by C-G formula  based on SCr of 2 mg/dL (H)). Recent Labs  Lab 09/30/23 1933  AST 12*  ALT 13  ALKPHOS 135*  BILITOT <0.2  PROT 6.0*  ALBUMIN 2.8*   Antimicrobials/Microbiology: Anti-infectives (From admission, onward)    None      Procedures: Procedure(s) (LRB): COLONOSCOPY (N/A) EGD (ESOPHAGOGASTRODUODENOSCOPY) (N/A) DILATION, ESOPHAGUS (N/A) Medications reviewed: Scheduled Meds:  [START ON 10/08/2023] amLODipine   5 mg Oral Daily   atorvastatin   20 mg Oral Daily   busPIRone   7.5 mg Oral BID   clopidogrel   75 mg Oral Daily   vitamin B-12  1,000 mcg Oral Daily   folic acid  1 mg Oral Daily   gabapentin   200 mg Oral QHS   memantine   10 mg Oral BID   [START ON 10/08/2023] mirtazapine   7.5 mg Oral QHS   mupirocin ointment   Nasal BID   pantoprazole  (PROTONIX ) IV  40 mg Intravenous Q12H   sertraline   50 mg Oral Daily   Continuous Infusions:  sodium chloride  50 mL/hr at 10/06/23 1209  Colin Dawley, MD [facility] 10/06/2023, 5:11 PM

## 2023-10-06 NOTE — Care Management Important Message (Signed)
 Important Message  Patient Details  Name: Susan Bennett MRN: [id number] Date of Birth: 04/21/1941   Important Message Given:  Yes - Medicare IM     Susan Bennett 10/06/2023, 10:32 AM

## 2023-10-06 NOTE — [place] FL2 (Signed)
 [place]  MEDICAID [id number] LEVEL OF CARE FORM     IDENTIFICATION  Patient Name: Susan Bennett Birthdate: Apr 02, 1941 Sex: female Admission Date (Current Location): 09/30/2023  [facility] and [place] Number:  [employer] and Address:  [facility],  [address] Susan Bennett [address]      Provider Number: [id number]  Attending Physician Name and Address:  Colin Dawley, MD  Relative Name and Phone Number:       Current Level of Care: Hospital Recommended Level of Care: Skilled Nursing Facility Prior Approval Number:    Date Approved/Denied:   PASRR Number: [id number]  Discharge Plan: SNF    Current Diagnoses: Patient Active Problem List   Diagnosis Date Noted   Rectal bleeding 10/02/2023   Esophageal dysphagia 10/02/2023   Hematochezia 09/30/2023   CKD (chronic kidney disease), stage III (HCC) 09/04/2023   TIA (transient ischemic attack) 09/04/2023   Idiopathic urticaria 06/19/2022   Bilateral lower extremity edema 06/19/2022   Moderate protein malnutrition (HCC) 06/10/2022   Acute kidney failure (HCC) 06/04/2022   Aspiration into respiratory tract 06/04/2022   Atelectasis 06/04/2022   Dehydration, severe 06/04/2022   Protrusion of lumbar intervertebral disc 11/15/2021   Early onset Alzheimer dementia (HCC) 12/02/2016   Vitamin B12 deficiency 12/02/2016   Primary hypertension 04/19/2016   Mild episode of recurrent major depressive disorder (HCC) 04/19/2016    Orientation RESPIRATION BLADDER Height & Weight     Self, Situation, Place  Normal Incontinent Weight: 120 lb 13 oz (54.8 kg) Height:  5\' 5"  (165.1 cm)  BEHAVIORAL SYMPTOMS/MOOD NEUROLOGICAL BOWEL NUTRITION STATUS      Incontinent Diet (See D/C summary)  AMBULATORY STATUS COMMUNICATION OF NEEDS Skin   Extensive Assist Verbally Normal                       Personal Care Assistance Level of Assistance  Bathing, Feeding, Dressing Bathing Assistance: Maximum assistance Feeding  assistance: Limited assistance Dressing Assistance: Maximum assistance     Functional Limitations Info  Sight, Hearing, Speech Sight Info: Adequate Hearing Info: Adequate Speech Info: Adequate    SPECIAL CARE FACTORS FREQUENCY  PT (By licensed PT), OT (By licensed OT)     PT Frequency: 5 times weekly OT Frequency: 5 times weekly            Contractures Contractures Info: Not present    Additional Factors Info  Code Status, Allergies Code Status Info: FULL Allergies Info: NKA           Current Medications (10/06/2023):  This is the current hospital active medication list Current Facility-Administered Medications  Medication Dose Route Frequency Provider Last Rate Last Admin   acetaminophen  (TYLENOL ) tablet 650 mg  650 mg Oral Q6H PRN Dorrell, Robert, MD       Or   acetaminophen  (TYLENOL ) suppository 650 mg  650 mg Rectal Q6H PRN Dorrell, Robert, MD       amLODipine  (NORVASC ) tablet 5 mg  5 mg Oral Daily Emokpae, Courage, MD   5 mg at 10/05/23 1146   atorvastatin  (LIPITOR) tablet 20 mg  20 mg Oral Daily Dorrell, Robert, MD   20 mg at 10/06/23 1047   benazepril  (LOTENSIN ) tablet 40 mg  40 mg Oral Daily Dorrell, Porfirio Bristol, MD   40 mg at 10/05/23 1037   busPIRone  (BUSPAR ) tablet 7.5 mg  7.5 mg Oral BID Myrl Askew, MD   7.5 mg at 10/06/23 1047   clopidogrel  (PLAVIX ) tablet 75 mg  75  mg Oral Daily Emokpae, Courage, MD   75 mg at 10/06/23 1047   cyanocobalamin  (VITAMIN B12) tablet 1,000 mcg  1,000 mcg Oral Daily Quintella Buck, Courage, MD   1,000 mcg at 10/06/23 1047   folic acid (FOLVITE) tablet 1 mg  1 mg Oral Daily Emokpae, Courage, MD   1 mg at 10/06/23 1047   gabapentin  (NEURONTIN ) capsule 200 mg  200 mg Oral QHS Dorrell, Robert, MD   200 mg at 10/05/23 2134   hydrALAZINE  (APRESOLINE ) injection 10 mg  10 mg Intravenous Q6H PRN Colin Dawley, MD   10 mg at 10/05/23 1149   memantine  (NAMENDA ) tablet 10 mg  10 mg Oral BID Dorrell, Robert, MD   10 mg at 10/06/23 1047    mirtazapine  (REMERON ) tablet 15 mg  15 mg Oral QHS Kc, Lurlene Salon, MD   15 mg at 10/05/23 2135   mupirocin ointment (BACTROBAN) 2 %   Nasal BID Colin Dawley, MD   Given at 10/06/23 1047   ondansetron  (ZOFRAN ) tablet 4 mg  4 mg Oral Q6H PRN Myrl Askew, MD       Or   ondansetron  (ZOFRAN ) injection 4 mg  4 mg Intravenous Q6H PRN Dorrell, Robert, MD       oxyCODONE  (Oxy IR/ROXICODONE ) immediate release tablet 5 mg  5 mg Oral Q4H PRN Dorrell, Robert, MD       pantoprazole  (PROTONIX ) injection 40 mg  40 mg Intravenous Q12H Kc, Ramesh, MD   40 mg at 10/06/23 1042   sertraline  (ZOLOFT ) tablet 50 mg  50 mg Oral Daily Dorrell, Robert, MD   50 mg at 10/06/23 1047     Discharge Medications: Please see discharge summary for a list of discharge medications.  Relevant Imaging Results:  Relevant Lab Results:   Additional Information SSN: [id number] LCSWA

## 2023-10-06 NOTE — Plan of Care (Signed)
  Problem: Acute Rehab PT Goals(only PT should resolve) Goal: Pt Will Go Supine/Side To Sit Outcome: Progressing Flowsheets (Taken 10/06/2023 1521) Pt will go Supine/Side to Sit:  with minimal assist  with moderate assist Goal: Patient Will Transfer Sit To/From Stand Outcome: Progressing Flowsheets (Taken 10/06/2023 1521) Patient will transfer sit to/from stand: with moderate assist Goal: Pt Will Transfer Bed To Chair/Chair To Bed Outcome: Progressing Flowsheets (Taken 10/06/2023 1521) Pt will Transfer Bed to Chair/Chair to Bed: with mod assist Goal: Pt Will Ambulate Outcome: Progressing Flowsheets (Taken 10/06/2023 1521) Pt will Ambulate:  10 feet  with moderate assist  with rolling walker   3:23 PM, 10/06/23 Walton Guppy, MPT Physical Therapist with [facility] [phone] office [phone] mobile phone

## 2023-10-06 NOTE — TOC Initial Note (Signed)
 Transition of Care [facility]) - Initial/Assessment Note    Patient Details  Name: Susan Bennett MRN: [id number] Date of Birth: [birth year]  Transition [facility] Care [facility]) CM/SW Contact:    Grandville Lax, LCSWA Phone Number: 10/06/2023, 11:01 AM  Clinical Narrative:                 CSW updated that PT is recommending SNF for pt at this time. CSW spoke with pts daughter who states they are agreeable to this and would like referral to be sent to [facility] and [facility]. CSW to complete referral and send out for review. TOC to follow.   Expected Discharge Plan: Skilled Nursing Facility Barriers to Discharge: Continued Medical Work up   Patient Goals and CMS Choice Patient states their goals for this hospitalization and ongoing recovery are:: get stronger CMS Medicare.gov Compare Post Acute Care list provided to:: Patient Represenative (must comment) Choice offered to / list presented to : Adult Children [employer] ownership interest in [facility].provided to:: Adult Children    Expected Discharge Plan and Services In-house Referral: Clinical Social Work Discharge Planning Services: CM Consult Post Acute Care Choice: Durable Medical Equipment Living arrangements for the past 2 months: Independent Living Facility Expected Discharge Date: 10/06/23                                    Prior Living Arrangements/Services Living arrangements for the past 2 months: Independent Living Facility Lives with:: Facility Resident Patient language and need for interpreter reviewed:: Yes Do you feel safe going back to the place where you live?: Yes      Need for Family Participation in Patient Care: Yes (Comment) Care giver support system in place?: Yes (comment) Current home services: [facility]itter Criminal Activity/Legal Involvement Pertinent to Current Situation/Hospitalization: No - Comment as needed  Activities of Daily Living   ADL Screening (condition at time of admission) Independently performs  ADLs?: No Does the patient have a NEW difficulty with bathing/dressing/toileting/self-feeding that is expected to last >3 days?: No Does the patient have a NEW difficulty with getting in/out of bed, walking, or climbing stairs that is expected to last >3 days?: No Does the patient have a NEW difficulty with communication that is expected to last >3 days?: No Is the patient deaf or have difficulty hearing?: No Does the patient have difficulty seeing, even when wearing glasses/contacts?: No Does the patient have difficulty concentrating, remembering, or making decisions?: Yes  Permission Sought/Granted                  Emotional Assessment Appearance:: Appears stated age Attitude/Demeanor/Rapport: Engaged Affect (typically observed): Accepting Orientation: : Oriented to Self, Oriented to Place, Oriented to Situation Alcohol / Substance Use: Not Applicable Psych Involvement: No (comment)  Admission diagnosis:  Hematochezia [K92.1] Rectal bleeding [K62.5] Patient Active Problem List   Diagnosis Date Noted   Rectal bleeding 10/02/2023   Esophageal dysphagia 10/02/2023   Hematochezia 09/30/2023   CKD (chronic kidney disease), stage III (HCC) 09/04/2023   TIA (transient ischemic attack) 09/04/2023   Idiopathic urticaria 06/19/2022   Bilateral lower extremity edema 06/19/2022   Moderate protein malnutrition (HCC) 06/10/2022   Acute kidney failure (HCC) 06/04/2022   Aspiration into respiratory tract 06/04/2022   Atelectasis 06/04/2022   Dehydration, severe 06/04/2022   Protrusion of lumbar intervertebral disc 11/15/2021   Early onset Alzheimer dementia (HCC) 12/02/2016   Vitamin B12 deficiency  12/02/2016   Primary hypertension 04/19/2016   Mild episode of recurrent major depressive disorder (HCC) 04/19/2016   PCP:  Theoplis Fix, MD Pharmacy:   [facility] [place], [place] - [phone] [place] [address] Phone:  [phone] Fax: [phone]  [facility] [place], [place] - [address] [place] [address] Phone: [phone] Fax: [phone]     Social Drivers of Health (SDOH) Social History: SDOH Screenings   Food Insecurity: Patient Unable To Answer (09/30/2023)  Housing: Patient Unable To Answer (09/30/2023)  Transportation Needs: Patient Unable To Answer (09/30/2023)  Utilities: Not At Risk (09/30/2023)  Alcohol Screen: Low Risk  (10/31/2021)  Depression (PHQ2-9): Low Risk  (04/03/2022)  Recent Concern: Depression (PHQ2-9) - Medium Risk (02/20/2022)  Financial Resource Strain: Low Risk  (09/04/2023)   Received from [facility] Care  Physical Activity: Insufficiently Active (10/31/2021)  Social Connections: Patient Unable To Answer (09/30/2023)  Stress: No Stress Concern Present (10/31/2021)  Tobacco Use: High Risk (09/30/2023)   SDOH Interventions:     Readmission Risk Interventions     No data to display

## 2023-10-06 NOTE — Plan of Care (Signed)
  Problem: Clinical Measurements: Goal: Ability to maintain clinical measurements within normal limits will improve Outcome: Progressing Goal: Will remain free from infection Outcome: Progressing Goal: Diagnostic test results will improve Outcome: Progressing   Problem: Nutrition: Goal: Adequate nutrition will be maintained Outcome: Progressing   Problem: Coping: Goal: Level of anxiety will decrease Outcome: Progressing   Problem: Elimination: Goal: Will not experience complications related to urinary retention Outcome: Progressing   Problem: Pain Managment: Goal: General experience of comfort will improve and/or be controlled Outcome: Progressing   Problem: Safety: Goal: Ability to remain free from injury will improve Outcome: Progressing   Problem: Skin Integrity: Goal: Risk for impaired skin integrity will decrease Outcome: Progressing

## 2023-10-06 NOTE — Evaluation (Signed)
 Physical Therapy Evaluation Patient Details Name: Susan Bennett MRN: [id number] DOB: 1941/03/08 Today's Date: 10/06/2023  History of Present Illness  Susan Bennett is a 83 y.o. female with medical history significant of CKD stage IIIb, dementia, prior stroke who presents manage department due to iSight episode of hematochezia.  Patient was in the bathroom at living facility and nursing had cleaned her rectum with noted bright red blood.  She was brought to the ER for further assessment.  On arrival she was afebrile and hemodynamically stable.  Labs were obtained which showed creatinine 1.52, AST 12, ALT 13, alkaline phosphatase 135, WBC 6.3, hemoglobin 10.6.  Patient underwent chest x-ray which showed no acute findings.  Patient was admitted for further workup.  Spoke with family who requested overnight observation due to concern for recurrent bleeding.   Clinical Impression  Patient presents very weak and limited to a couple of shuffling side steps at bedside before having to sit due to poor standing balance. Patient tolerated staying up in chair after therapy. Patient will benefit from continued skilled physical therapy in hospital and recommended venue below to increase strength, balance, endurance for safe ADLs and gait.          If plan is discharge home, recommend the following: A lot of help with bathing/dressing/bathroom;A lot of help with walking and/or transfers;Help with stairs or ramp for entrance;Assistance with cooking/housework   Can travel by private vehicle   No    Equipment Recommendations None recommended by PT  Recommendations for Other Services       Functional Status Assessment Patient has had a recent decline in their functional status and demonstrates the ability to make significant improvements in function in a reasonable and predictable amount of time.     Precautions / Restrictions Precautions Precautions: Fall Restrictions Weight Bearing Restrictions Per  Provider Order: No      Mobility  Bed Mobility Overal bed mobility: Needs Assistance Bed Mobility: Supine to Sit, Sit to Supine     Supine to sit: Mod assist, Max assist Sit to supine: Mod assist, Max assist   General bed mobility comments: slow labored movement    Transfers Overall transfer level: Needs assistance Equipment used: Rolling walker (2 wheels), 1 person hand held assist Transfers: Sit to/from Stand, Bed to chair/wheelchair/BSC Sit to Stand: Max assist   Step pivot transfers: Max assist       General transfer comment: poor standing balance due to weakness    Ambulation/Gait Ambulation/Gait assistance: Max assist Gait Distance (Feet): 2 Feet Assistive device: Rolling walker (2 wheels) Gait Pattern/deviations: Decreased step length - right, Decreased step length - left, Decreased stride length, Trunk flexed, Shuffle Gait velocity: slow     General Gait Details: limited to a couple of shuffling side steps due to BLE weakness, poor standing balance  Stairs            Wheelchair Mobility     Tilt Bed    Modified Rankin (Stroke Patients Only)       Balance Overall balance assessment: Needs assistance Sitting-balance support: Feet supported, No upper extremity supported Sitting balance-Leahy Scale: Poor Sitting balance - Comments: fair/poor seated at EOB   Standing balance support: Reliant on assistive device for balance, During functional activity, Bilateral upper extremity supported Standing balance-Leahy Scale: Poor Standing balance comment: using RW  Pertinent Vitals/Pain Pain Assessment Pain Assessment: No/denies pain    Home Living Family/patient expects to be discharged to:: Assisted living                 Home Equipment: Rolling Walker (2 wheels)      Prior Function Prior Level of Function : Needs assist       Physical Assist : Mobility (physical);ADLs (physical) Mobility  (physical): Bed mobility;Transfers;Gait;Stairs   Mobility Comments: household ambulation using RW       Extremity/Trunk Assessment   Upper Extremity Assessment Upper Extremity Assessment: Generalized weakness    Lower Extremity Assessment Lower Extremity Assessment: Generalized weakness    Cervical / Trunk Assessment Cervical / Trunk Assessment: Kyphotic  Communication   Communication Communication: No apparent difficulties    Cognition Arousal: Alert Behavior During Therapy: WFL for tasks assessed/performed   PT - Cognitive impairments: No apparent impairments                         Following commands: Impaired Following commands impaired: Follows one step commands with increased time     Cueing Cueing Techniques: Verbal cues, Tactile cues     General Comments      Exercises     Assessment/Plan    PT Assessment Patient needs continued PT services  PT Problem List Decreased strength;Decreased activity tolerance;Decreased balance;Decreased mobility       PT Treatment Interventions DME instruction;Gait training;Functional mobility training;Therapeutic activities;Therapeutic exercise;Balance training;Stair training;Patient/family education    PT Goals (Current goals can be found in the Care Plan section)  Acute Rehab PT Goals Patient Stated Goal: return home PT Goal Formulation: With patient Time For Goal Achievement: 10/21/23 Potential to Achieve Goals: Good    Frequency Min 3X/week     Co-evaluation               AM-PAC PT "6 Clicks" Mobility  Outcome Measure Help needed turning from your back to your side while in a flat bed without using bedrails?: A Lot Help needed moving from lying on your back to sitting on the side of a flat bed without using bedrails?: A Lot Help needed moving to and from a bed to a chair (including a wheelchair)?: A Lot Help needed standing up from a chair using your arms (e.g., wheelchair or bedside chair)?:  A Lot Help needed to walk in hospital room?: A Lot Help needed climbing 3-5 steps with a railing? : Total 6 Click Score: 11    End of Session   Activity Tolerance: Patient tolerated treatment well;Patient limited by fatigue;Patient limited by lethargy Patient left: in chair;with call bell/phone within reach;with chair alarm set Nurse Communication: Mobility status PT Visit Diagnosis: Unsteadiness on feet (R26.81);Other abnormalities of gait and mobility (R26.89);Muscle weakness (generalized) (M62.81)    Time: 8469-6295 PT Time Calculation (min) (ACUTE ONLY): 27 min   Charges:   PT Evaluation $PT Eval Moderate Complexity: 1 Mod PT Treatments $Therapeutic Activity: 23-37 mins PT General Charges $$ ACUTE PT VISIT: 1 Visit         3:21 PM, 10/06/23 Walton Guppy, MPT Physical Therapist with [facility] [phone] office [phone] mobile phone

## 2023-10-06 NOTE — Progress Notes (Signed)
 Nurse at bedside,patient very sleepy this am,responding to voice.Patient out of bed to chair.Blood pressure 111/50,heart rate 61,Dr Courage notified,holding the Lotensin ,and the amlodipine . Patient was able to take her medications crushed in applesauce.Family and private sitter at bedside. Plan of care on going.

## 2023-10-06 NOTE — TOC Progression Note (Signed)
 Transition of Care [facility]) - Progression Note    Patient Details  Name: Susan Bennett MRN: [id number] Date of Birth: [birth year]  [facility]) CM/SW Contact  Grandville Lax, [place] Phone Number: 10/06/2023, 3:28 PM  Clinical Narrative:    CSW spoke with pts daughter to review bed offers, they would like to accept bed at [facility] rehab at this time. CSW updated HUB with facility choice. CSW updated Shana in admissions also. TOC to follow.   Expected Discharge Plan: Skilled Nursing Facility Barriers to Discharge: Continued Medical Work up  Expected Discharge Plan and Services In-house Referral: Clinical Social Work Discharge Planning Services: CM Consult Post Acute Care Choice: Durable Medical Equipment Living arrangements for the past 2 months: Independent Living Facility Expected Discharge Date: 10/06/23                                     Social Determinants of Health (SDOH) Interventions SDOH Screenings   Food Insecurity: Patient Unable To Answer (09/30/2023)  Housing: Patient Unable To Answer (09/30/2023)  Transportation Needs: Patient Unable To Answer (09/30/2023)  Utilities: Not At Risk (09/30/2023)  Alcohol Screen: Low Risk  (10/31/2021)  Depression (PHQ2-9): Low Risk  (04/03/2022)  Recent Concern: Depression (PHQ2-9) - Medium Risk (02/20/2022)  Financial Resource Strain: Low Risk  (09/04/2023)   Received [facility] Care  Physical Activity: Insufficiently Active (10/31/2021)  Social Connections: Patient Unable To Answer (09/30/2023)  Stress: No Stress Concern Present (10/31/2021)  Tobacco Use: High Risk (09/30/2023)    Readmission Risk Interventions     No data to display

## 2023-10-07 ENCOUNTER — Encounter (INDEPENDENT_AMBULATORY_CARE_PROVIDER_SITE_OTHER): Payer: Self-pay | Admitting: *Deleted

## 2023-10-07 DIAGNOSIS — K921 Melena: Secondary | ICD-10-CM | POA: Diagnosis not present

## 2023-10-07 LAB — GLUCOSE, CAPILLARY
Glucose-Capillary: 89 mg/dL (ref 70–99)
Glucose-Capillary: 120 mg/dL — ABNORMAL HIGH (ref 70–99)
Glucose-Capillary: 139 mg/dL — ABNORMAL HIGH (ref 70–99)
Glucose-Capillary: 125 mg/dL — ABNORMAL HIGH (ref 70–99)

## 2023-10-07 LAB — RENAL FUNCTION PANEL
Albumin: 2.5 g/dL — ABNORMAL LOW (ref 3.5–5.0)
Anion gap: 7 (ref 5–15)
BUN: 27 mg/dL — ABNORMAL HIGH (ref 8–23)
CO2: 19 mmol/L — ABNORMAL LOW (ref 22–32)
Calcium: 8 mg/dL — ABNORMAL LOW (ref 8.9–10.3)
Chloride: 115 mmol/L — ABNORMAL HIGH (ref 98–111)
Creatinine, Ser: 2.52 mg/dL — ABNORMAL HIGH (ref 0.44–1.00)
GFR, Estimated: 18 mL/min — ABNORMAL LOW (ref >60)
Glucose, Bld: 83 mg/dL (ref 70–99)
Phosphorus: 4 mg/dL (ref 2.5–4.6)
Potassium: 3.9 mmol/L (ref 3.5–5.1)
Sodium: 141 mmol/L (ref 135–145)

## 2023-10-07 LAB — SURGICAL PATHOLOGY

## 2023-10-07 MED ORDER — SODIUM CHLORIDE 0.9 % IV SOLN: INTRAVENOUS | Status: AC

## 2023-10-07 MED ORDER — PANTOPRAZOLE SODIUM 40 MG PO TBEC
40.0000 mg | DELAYED_RELEASE_TABLET | Freq: Two times a day (BID) | ORAL | Status: DC
  Administered 2023-10-07 – 2023-10-08 (×2): 40 mg via ORAL
  Filled 2023-10-07 (×2): qty 1

## 2023-10-07 NOTE — Progress Notes (Signed)
 Patient more alert today,alert to self,confused to time ,place and situation. Patient out of bed to chair.Patient ate more than 50 percent of her lunch today. No c/o pain or discomfort noted . Plan of care on going.

## 2023-10-07 NOTE — Plan of Care (Signed)
  Problem: Pain Managment: Goal: General experience of comfort will improve and/or be controlled Outcome: Progressing   Problem: Safety: Goal: Ability to remain free from injury will improve Outcome: Progressing   Problem: Pain Managment: Goal: General experience of comfort will improve and/or be controlled Outcome: Progressing   Problem: Safety: Goal: Ability to remain free from injury will improve Outcome: Progressing

## 2023-10-07 NOTE — Progress Notes (Signed)
 Physical Therapy Treatment Patient Details Name: Susan Bennett MRN: [id number] DOB: 1941-05-26 Today's Date: 10/07/2023   History of Present Illness Susan Bennett is a 83 y.o. female with medical history significant of CKD stage IIIb, dementia, prior stroke who presents manage department due to iSight episode of hematochezia.  Patient was in the bathroom at living facility and nursing had cleaned her rectum with noted bright red blood.  She was brought to the ER for further assessment.  On arrival she was afebrile and hemodynamically stable.  Labs were obtained which showed creatinine 1.52, AST 12, ALT 13, alkaline phosphatase 135, WBC 6.3, hemoglobin 10.6.  Patient underwent chest x-ray which showed no acute findings.  Patient was admitted for further workup.  Spoke with family who requested overnight observation due to concern for recurrent bleeding.    PT Comments  Patient agreeable to and tolerated session well. Patient with a visitor/sitter present during treatment and is very encouraging to patient throughout session. Patient received supine in bed. Mod/max A for supine>sit, sit to stand, and during ambulation with RW. Pt demo fair sitting balance, requiring CGA t/o seated EOB LE exercises due to intermittent posterior lean, just as during ambulation. Pt limited most due to fatigue. Pt left in recliner with visitor and nursing staff present. Patient will benefit from continued skilled physical therapy acutely and in recommended venue in order to improve function, and overall QOL before returning to previous living situation.     If plan is discharge home, recommend the following: A lot of help with bathing/dressing/bathroom;A lot of help with walking and/or transfers;Help with stairs or ramp for entrance;Assistance with cooking/housework   Can travel by private vehicle     No  Equipment Recommendations  None recommended by PT    Recommendations for Other Services       Precautions /  Restrictions Precautions Precautions: Fall Restrictions Weight Bearing Restrictions Per Provider Order: No     Mobility  Bed Mobility Overal bed mobility: Needs Assistance Bed Mobility: Supine to Sit     Supine to sit: Mod assist, Max assist     General bed mobility comments: Pt initiates tfs with verbal cues, demo slow labored movement, able to get LEs towards EOB, requiring max A to get trunk EOB, mod A for scooting bottom to EOB    Transfers Overall transfer level: Needs assistance Equipment used: Rolling walker (2 wheels) Transfers: Sit to/from Stand, Bed to chair/wheelchair/BSC Sit to Stand: Mod assist, Max assist   Step pivot transfers: Mod assist       General transfer comment: Mod/max A for STS, pt initiates but A needed to complete 2/2 weakness. Cont verbal cues needed for hip ext to maintain upright posture during stand/steps. Mod A for balance, pt demo post. lean t/o    Ambulation/Gait Ambulation/Gait assistance: Mod assist, Max assist Gait Distance (Feet): 5 Feet Assistive device: Rolling walker (2 wheels) Gait Pattern/deviations: Decreased step length - right, Decreased step length - left, Decreased stride length, Trunk flexed, Shuffle Gait velocity: Decreased     General Gait Details: Mod/max A for forward steps in room w/ RW due to imbalance/weakness. Multiple steps needed to complete turns.   Stairs       Wheelchair Mobility     Tilt Bed    Modified Rankin (Stroke Patients Only)       Balance Overall balance assessment: Needs assistance Sitting-balance support: Feet supported, No upper extremity supported Sitting balance-Leahy Scale: Fair Sitting balance - Comments: Seated EOB, pt  can maintain seated balance a few seconds at a time during EOB LE exercises.   Standing balance support: Reliant on assistive device for balance, During functional activity, Bilateral upper extremity supported Standing balance-Leahy Scale: Poor Standing  balance comment: w/ RW, needed mod/max A 2/2 post lean at times          Communication Communication Communication: No apparent difficulties  Cognition Arousal: Alert Behavior During Therapy: WFL for tasks assessed/performed   PT - Cognitive impairments: No apparent impairments       Following commands: Intact Following commands impaired: Only follows one step commands consistently    Cueing Cueing Techniques: Verbal cues, Visual cues  Exercises General Exercises - Lower Extremity Ankle Circles/Pumps: AROM, Strengthening, Both, 10 reps, Seated Long Arc Quad: AROM, Strengthening, Both, 10 reps, Seated Hip Flexion/Marching: AROM, Strengthening, Both, 10 reps, Seated    General Comments        Pertinent Vitals/Pain Pain Assessment Pain Assessment: No/denies pain    Home Living                          Prior Function            PT Goals (current goals can now be found in the care plan section) Acute Rehab PT Goals Patient Stated Goal: return home PT Goal Formulation: With patient Time For Goal Achievement: 10/21/23 Potential to Achieve Goals: Good Progress towards PT goals: Progressing toward goals    Frequency    Min 3X/week      PT Plan      Co-evaluation              AM-PAC PT "6 Clicks" Mobility   Outcome Measure  Help needed turning from your back to your side while in a flat bed without using bedrails?: A Lot Help needed moving from lying on your back to sitting on the side of a flat bed without using bedrails?: A Lot Help needed moving to and from a bed to a chair (including a wheelchair)?: A Lot Help needed standing up from a chair using your arms (e.g., wheelchair or bedside chair)?: A Lot Help needed to walk in hospital room?: A Lot Help needed climbing 3-5 steps with a railing? : Total 6 Click Score: 11    End of Session Equipment Utilized During Treatment: Gait belt Activity Tolerance: Patient tolerated treatment  well;Patient limited by fatigue Patient left: in chair;with call bell/phone within reach;with family/visitor present;with nursing/sitter in room   PT Visit Diagnosis: Unsteadiness on feet (R26.81);Other abnormalities of gait and mobility (R26.89);Muscle weakness (generalized) (M62.81)     Time: 1610-9604 PT Time Calculation (min) (ACUTE ONLY): 25 min  Charges:    $Therapeutic Exercise: 8-22 mins $Therapeutic Activity: 8-22 mins PT General Charges $$ ACUTE PT VISIT: 1 Visit                     10:34 AM, 10/07/23 Marysue Sola, PT, DPT [place] with [facility]

## 2023-10-07 NOTE — Progress Notes (Signed)
 PROGRESS NOTE Susan Bennett  ZOX:[id number] DOB: Oct 04, 1940 DOA: 09/30/2023 PCP: Theoplis Fix, MD  Brief Narrative/Hospital Course:  83 y.o. female with medical history significant of CKD stage IIIb, dementia, prior stroke who presented due to single episode of hematochezia.  Patient was in the bathroom at living facility and nursing had cleaned her rectum with noted bright red blood.  She was brought to the ER for further assessment.  On arrival she was afebrile and hemodynamically stable.  Labs were obtained which showed creatinine 1.52, AST 12, ALT 13, alkaline phosphatase 135, WBC 6.3, hemoglobin 10.6.  Patient underwent chest x-ray which showed no acute findings. Patient was admitted for further workup.  Spoke with family who requested overnight observation due to concern for recurrent bleeding. Overnight remained afebrile BP on higher side 170s-hemodynamically stable, repeat hemoglobin 9.4 g from 10.6, b/l HB ~1 GM IN 12/23/22.  Repeat hemoglobin has been stable.  Discussed plan of care with patient's daughter they are concerned about patient's swallowing,  SlP consulted-after evaluation diet by dysphagia to find chopped with thin liquid:  Subjective:   -Updated daughter - More awake and oral intake improving - Voiding okay  A/p 1)Hematochezia Single Episode--Acute on chronic chronic anemia: -No evidence of iron deficiency per Lab workup (ferritin, serum iron TIBC and iron saturation WNL) -Check B12 and folate in a.m. -on 10/04/23 patient underwent COLONOSCOPY FINDINGS: - A single non-bleeding colonic angioectasia.  - One 15 mm polyp in the cecum. Biopsied.  - Diverticulosis in the sigmoid colon and in the descending colon.  - Congested mucosa in the sigmoid colon. This made the advancement of the scope very difficult. - Non-bleeding internal hemorrhoids.  Pathology with tubular adenoma in the cecum, GI service plans to discuss benefits versus risks of repeating colonoscopy. - Per GI  team okay to restart Plavix  on 10/05/2023 - Hemoglobin currently stable around 10  --Hgb may drop in a.m. due to IV fluids/hemodilution (Got  IV fluids on 10/06/2023 and 10/07/23) - Serum folate is low B12 low normal, give B12 and folate supplements  2)Post sedation/postprocedural hypothermia with corresponding bradycardia -- After EGD and colonoscopy on 10/04/23 with sedation patient was found to be hypothermic with rectal temperature around 94 and heart rate in the high 40s - hypothermia and relative bradycardia as resolved - No further need for warming blankets okay to transfer out of stepdown to telemetry unit  3)Dysphagia: Slp eval requested and advised dysphagia diet.   Underwent EGD on 10/21/2023---findings include-- Low-grade of narrowing Schatzki ring, Dilated.  - 4 cm hiatal hernia. - Normal stomach. - Normal examined duodenum - Continue modified diet with aspiration precautions - More awake on 10/07/2023 oral intake improving  4)AKI----acute kidney injury on CKD stage -3b   - creatinine on admission=1.52 ,  baseline creatinine =    , creatinine is now=1.4 to 1.5  , - Creatinine up to 2.52 -due to decreased oral intake over the last couple days--compounded by colonoscopy prep and GI losses -Anticipate improvement with IV fluids  renally adjust medications, avoid nephrotoxic agents / dehydration  / hypotension  5)Generalized weakness and deconditioning----patient is now requiring assist of 2 to transfer - May not be able to return to independent living at current level of functioning -- Physical therapy eval requested to determine disposition  Hypertension----primarily systolic hypertension BP labile at times poorly controlled, -Resume PTA benazepril  -Use IV hydralazine  as needed elevated BP  GAD: continue BuSpar    History of stroke: continue Plavix   Depression/Cognitive impairment: Stable, continue  Namenda  and her Zoloft , BuSpar , doxepin   on 10/06/23--excessive sleepiness  noted Remeron  adjusted   HLD: continue Lipitor   DVT prophylaxis: SCDs Start: 09/30/23 2232 Code Status:   Code Status: Full Code Family Communication: plan of care discussed with patient/daughter Patient status is: Remains hospitalized because of severity of illness Level of care: Telemetry   Dispo: The patient is from: home            Anticipated disposition: -Phy Therapy recommends SNF rehab  Objective: Vitals last 24 hrs: Vitals:   10/07/23 0631 10/07/23 1026 10/07/23 1405 10/07/23 1951  BP: (!) 114/97 129/63 (!) 127/52 (!) 111/59  Pulse: 73 60 66 70  Resp: 16 18 20 16   Temp: 97.8 F (36.6 C) 97.8 F (36.6 C) 97.7 F (36.5 C) 99 F (37.2 C)  TempSrc: Oral Oral Oral Oral  SpO2: 90% 92% 94% 94%  Weight:      Height:       Physical Exam  Gen:-Somewhat sleepy, frail and elderly appearing, in no acute distress  HEENT:- [place].AT, No sclera icterus Neck-Supple Neck,No JVD,.  Lungs-  CTAB , fair air movement bilaterally  CV- S1, S2 normal, RRR Abd-  +ve B.Sounds, Abd Soft, No tenderness,    Extremity/Skin:- No  edema,   good pedal pulses  Psych-affect is appropriate, oriented x3 Neuro-generalized weakness, no new focal deficits, no tremors   Data Reviewed: I have personally reviewed following labs and imaging studies ( see epic result tab) CBC: Recent Labs  Lab 10/02/23 0954 10/03/23 0641 10/04/23 0442 10/04/23 1543 10/05/23 0359 10/06/23 0451  WBC 6.1 5.6 5.0  --  6.3 5.8  NEUTROABS 3.9  --   --   --   --   --   HGB 11.1* 9.9* 10.1* 11.5* 10.0* 9.9*  HCT 34.9* 29.6* 31.8* 35.4* 30.8* 32.8*  MCV 98.6 97.4 97.0  --  96.9 99.1  PLT 225 209 215  --  233 206   CMP: Recent Labs  Lab 10/04/23 0442 10/05/23 0359 10/06/23 0451 10/07/23 0501  NA 142 137 138 141  K 4.0 3.8 3.9 3.9  CL 111 108 109 115*  CO2 23 20* 22 19*  GLUCOSE 96 95 105* 83  BUN 16 18 20  27*  CREATININE 1.35* 1.64* 2.00* 2.52*  CALCIUM  8.7* 8.2* 8.2* 8.0*  PHOS  --   --   --  4.0   GFR:  Estimated Creatinine Clearance: 14.4 mL/min (A) (by C-G formula based on SCr of 2.52 mg/dL (H)). Recent Labs  Lab 10/07/23 0501  ALBUMIN 2.5*   Antimicrobials/Microbiology: Anti-infectives (From admission, onward)    None      Procedures: Procedure(s) (LRB): COLONOSCOPY (N/A) EGD (ESOPHAGOGASTRODUODENOSCOPY) (N/A) DILATION, ESOPHAGUS (N/A) Medications reviewed: Scheduled Meds:  [START ON 10/08/2023] amLODipine   5 mg Oral Daily   atorvastatin   20 mg Oral Daily   busPIRone   7.5 mg Oral BID   clopidogrel   75 mg Oral Daily   vitamin B-12  1,000 mcg Oral Daily   folic acid  1 mg Oral Daily   gabapentin   200 mg Oral QHS   memantine   10 mg Oral BID   [START ON 10/08/2023] mirtazapine   7.5 mg Oral QHS   mupirocin ointment   Nasal BID   pantoprazole   40 mg Oral BID   sertraline   50 mg Oral Daily   Continuous Infusions:  sodium chloride  100 mL/hr at 10/07/23 1057   Merit Gadsby, MD [facility] 10/07/2023, 9:16 PM

## 2023-10-07 NOTE — Progress Notes (Signed)
 Mobility Specialist Progress Note:    10/07/23 1543  Mobility  Activity Transferred from chair to bed  Level of Assistance Maximum assist, patient does 25-49%  Assistive Device None  Range of Motion/Exercises Active;All extremities  Activity Response Tolerated well  Mobility Referral No  Mobility visit 1 Mobility  Mobility Specialist Start Time (ACUTE ONLY) 1530  Mobility Specialist Stop Time (ACUTE ONLY) 1543  Mobility Specialist Time Calculation (min) (ACUTE ONLY) 13 min   Pt's personal sitter requested assistance to clean and transfer pt. Required MaxA to transfer with no AD. Tolerated well, asx throughout. Left pt supine with sitter, all needs met.  Susan Bennett Mobility Specialist Please contact via Special educational needs teacher or  Rehab office at [phone]

## 2023-10-07 NOTE — Plan of Care (Signed)
  Problem: Clinical Measurements: Goal: Ability to maintain clinical measurements within normal limits will improve Outcome: Progressing Goal: Will remain free from infection Outcome: Progressing Goal: Diagnostic test results will improve Outcome: Progressing   Problem: Nutrition: Goal: Adequate nutrition will be maintained Outcome: Progressing   Problem: Pain Managment: Goal: General experience of comfort will improve and/or be controlled Outcome: Progressing   Problem: Safety: Goal: Ability to remain free from injury will improve Outcome: Progressing

## 2023-10-08 DIAGNOSIS — R1319 Other dysphagia: Secondary | ICD-10-CM

## 2023-10-08 DIAGNOSIS — I10 Essential (primary) hypertension: Secondary | ICD-10-CM

## 2023-10-08 DIAGNOSIS — E785 Hyperlipidemia, unspecified: Secondary | ICD-10-CM

## 2023-10-08 DIAGNOSIS — K921 Melena: Secondary | ICD-10-CM | POA: Diagnosis not present

## 2023-10-08 DIAGNOSIS — N179 Acute kidney failure, unspecified: Secondary | ICD-10-CM

## 2023-10-08 LAB — BASIC METABOLIC PANEL WITH GFR
Anion gap: 7 (ref 5–15)
BUN: 30 mg/dL — ABNORMAL HIGH (ref 8–23)
CO2: 18 mmol/L — ABNORMAL LOW (ref 22–32)
Calcium: 7.6 mg/dL — ABNORMAL LOW (ref 8.9–10.3)
Chloride: 116 mmol/L — ABNORMAL HIGH (ref 98–111)
Creatinine, Ser: 1.85 mg/dL — ABNORMAL HIGH (ref 0.44–1.00)
GFR, Estimated: 27 mL/min — ABNORMAL LOW (ref >60)
Glucose, Bld: 108 mg/dL — ABNORMAL HIGH (ref 70–99)
Potassium: 3.8 mmol/L (ref 3.5–5.1)
Sodium: 141 mmol/L (ref 135–145)

## 2023-10-08 LAB — GLUCOSE, CAPILLARY
Glucose-Capillary: 118 mg/dL — ABNORMAL HIGH (ref 70–99)
Glucose-Capillary: 102 mg/dL — ABNORMAL HIGH (ref 70–99)

## 2023-10-08 MED ORDER — GABAPENTIN 100 MG PO CAPS: 100.0000 mg | ORAL_CAPSULE | Freq: Every day | ORAL | Status: AC

## 2023-10-08 MED ORDER — ACETAMINOPHEN 325 MG PO TABS: 650.0000 mg | ORAL_TABLET | Freq: Four times a day (QID) | ORAL | Status: AC | PRN

## 2023-10-08 MED ORDER — AMLODIPINE BESYLATE 5 MG PO TABS: 5.0000 mg | ORAL_TABLET | Freq: Every day | ORAL | Status: AC

## 2023-10-08 MED ORDER — PANTOPRAZOLE SODIUM 40 MG PO TBEC: 40.0000 mg | DELAYED_RELEASE_TABLET | Freq: Two times a day (BID) | ORAL | Status: AC

## 2023-10-08 MED ORDER — MIRTAZAPINE 7.5 MG PO TABS: 7.5000 mg | ORAL_TABLET | Freq: Every day | ORAL | Status: AC

## 2023-10-08 MED ORDER — FOLIC ACID 1 MG PO TABS: 1.0000 mg | ORAL_TABLET | Freq: Every day | ORAL | Status: AC

## 2023-10-08 NOTE — Progress Notes (Signed)
 Patient being discharged to [facility],report called and given to [facility] LPN.IV discontinued,catheter intact. No c/o pain or discomfort noted at this time. Family at the bedside. Plan of care on going.

## 2023-10-08 NOTE — Plan of Care (Signed)
  Problem: Clinical Measurements: Goal: Ability to maintain clinical measurements within normal limits will improve Outcome: Progressing Goal: Will remain free from infection Outcome: Progressing Goal: Diagnostic test results will improve Outcome: Progressing   Problem: Nutrition: Goal: Adequate nutrition will be maintained Outcome: Progressing   Problem: Pain Managment: Goal: General experience of comfort will improve and/or be controlled Outcome: Progressing   Problem: Safety: Goal: Ability to remain free from injury will improve Outcome: Progressing

## 2023-10-08 NOTE — TOC Transition Note (Signed)
 Transition of Care [facility]) - Discharge Note   Patient Details  Name: Susan Bennett MRN: [id number] Date of Birth: [birth year]  [facility] CM/SW Contact:  Grandville Lax, LCSWA Phone Number: 10/08/2023, 12:26 PM   Clinical Narrative:    CSW updated that pt is medically stable for D/C to [facility] rehab today. CSW updated Jorge Newcomer in admissions who states they are able to accept pt today. CSW sent D/C clinicals over to facility via [facility]. CSW provided RN with room and report number. Med necessity completed and sent to floor for RN. CSW spoke with pts daughter to provide update on plan for D/C. CSW to call for EMS. TOC signing off.    Final next level of care: Skilled Nursing Facility Barriers to Discharge: Barriers Resolved   Patient Goals and CMS Choice Patient states their goals for this hospitalization and ongoing recovery are:: go to SNF CMS Medicare.gov Compare Post Acute Care list provided to:: Patient Represenative (must comment) Choice offered to / list presented to : Adult Children [place] ownership interest in [facility].provided to:: Adult Children    Discharge Placement                Patient to be transferred to facility by: EMS Name of family member notified: Daughter Patient and family notified of of transfer: 10/08/23  Discharge Plan and Services Additional resources added to the After Visit Summary for   In-house Referral: Clinical Social Work Discharge Planning Services: CM Consult Post Acute Care Choice: Durable Medical Equipment                               Social Drivers of Health (SDOH) Interventions SDOH Screenings   Food Insecurity: Patient Unable To Answer (09/30/2023)  Housing: Patient Unable To Answer (09/30/2023)  Transportation Needs: Patient Unable To Answer (09/30/2023)  Utilities: Not At Risk (09/30/2023)  Alcohol Screen: Low Risk  (10/31/2021)  Depression (PHQ2-9): Low Risk  (04/03/2022)  Recent Concern: Depression  (PHQ2-9) - Medium Risk (02/20/2022)  Financial Resource Strain: Low Risk  (09/04/2023)   Received from [facility]  Physical Activity: Insufficiently Active (10/31/2021)  Social Connections: Patient Unable To Answer (09/30/2023)  Stress: No Stress Concern Present (10/31/2021)  Tobacco Use: High Risk (09/30/2023)     Readmission Risk Interventions    10/08/2023   12:25 PM  Readmission Risk Prevention Plan  Transportation Screening Complete  Home Care Screening Complete  Medication Review (RN CM) Complete

## 2023-10-08 NOTE — Discharge Summary (Signed)
 Physician Discharge Summary   Patient: Susan Bennett MRN: [id number] DOB: [birth year]  Admit date:     09/30/2023  Discharge date: 10/08/23  Discharge Physician: Justina Oman   PCP: Theoplis Fix, MD   Recommendations at discharge:  Repeat basic metabolic panel to follow ultralights and renal function Repeat CBC to follow hemoglobin trend Reassess blood pressure and adjust antihypertensive treatment as needed. Make sure patient follow-up with gastroenterology service as instructed.   Discharge Diagnoses: Principal Problem:   Hematochezia Active Problems:   AKI (acute kidney injury) on chronic kidney disease stage IIIb (HCC)   Rectal bleeding   Esophageal dysphagia   Hyperlipidemia   Brief Hospital admission narrative: As per H&P written by Dr. Jeannene Milling on 10/01/2023 Susan Bennett is a 83 y.o. female with medical history significant of CKD stage IIIb, dementia, prior stroke who presents manage department due to iSight episode of hematochezia.  Patient was in the bathroom at living facility and nursing had cleaned her rectum with noted bright red blood.  She was brought to the ER for further assessment.  On arrival she was afebrile and hemodynamically stable.  Labs were obtained which showed creatinine 1.52, AST 12, ALT 13, alkaline phosphatase 135, WBC 6.3, hemoglobin 10.6.  Patient underwent chest x-ray which showed no acute findings.  Patient was admitted for further workup.  Spoke with family who requested overnight observation due to concern for recurrent bleeding.   Assessment and Plan: 1)Hematochezia Single Episode--Acute on chronic chronic anemia: -No evidence of iron deficiency per Lab workup (ferritin, serum iron TIBC and iron saturation WNL) - Continue B12 supplementation - Folic acid on daily basis has been started - Repeat CBC at follow-up visit to follow hemoglobin trend. -on 10/04/23 patient underwent COLONOSCOPY FINDINGS: - A single non-bleeding colonic angioectasia.  -  One 15 mm polyp in the cecum. Biopsied.  - Diverticulosis in the sigmoid colon and in the descending colon.  - Congested mucosa in the sigmoid colon. This made the advancement of the scope very difficult. - Non-bleeding internal hemorrhoids.  -Pathology with tubular adenoma in the cecum, GI service plans to discuss benefits versus risks of repeating colonoscopy at follow-up visit (3-4 weeks after discharge). - Per GI team okay to restart Plavix  on 10/05/2023    2)Post sedation/postprocedural hypothermia with corresponding bradycardia -- After EGD and colonoscopy on 10/04/23 with sedation patient was found to be hypothermic with rectal temperature around 94 and heart rate in the high 40s - hypothermia and relative bradycardia as resolved - No further need for warming blankets okay to transfer out of stepdown to telemetry unit -Hemodynamically stable and ready for discharge.   3)Dysphagia: Slp eval requested and advised dysphagia diet.   Underwent EGD on 10/21/2023---findings include-- Low-grade of narrowing Schatzki ring, Dilated.  - 4 cm hiatal hernia. - Normal stomach. - Normal examined duodenum - Continue modified diet with aspiration precautions; (soft diet with chopped meats and thin liquids). - Continue PPI.   4)AKI----acute kidney injury on CKD stage -3b   - creatinine on admission=1.52 ,  - Creatinine up to 2.52 -due to decreased oral intake over the last couple days--compounded by colonoscopy prep and GI losses - Improving/stabilize after fluid resuscitation and holding nephrotoxic agents - Creatinine back to baseline at discharge - Patient advised to maintain adequate hydration - Continue to follow renal function trend.   5)Generalized weakness and deconditioning----patient is now requiring assist of 2 to transfer - May not be able to return to independent living at current  level of functioning -- Physical therapy eval requested to determine disposition    6)Hypertension----primarily systolic hypertension BP labile at times poorly controlled, - Continue to hold ARB at discharge and use amlodipine  for blood pressure control.   7)GAD: -continue BuSpar    8)History of stroke: -continue Plavix    9)Depression/Cognitive impairment: -Stable, continue Namenda  and her Zoloft , BuSpar , doxepin  -Due to increasing sleepiness Remeron  dosage has been adjusted - Continue to follow mood stability. -Supportive care and constant reorientation recommended.   10)HLD: -continue Lipitor    Consultants: GI service Procedures performed: See below for x-ray reports Disposition: Skilled nursing facility Diet recommendation: Soft diet with chopped meats and thin liquids  DISCHARGE MEDICATION: Allergies as of 10/08/2023   No Known Allergies      Medication List     STOP taking these medications    benazepril  40 MG tablet Commonly known as: LOTENSIN    doxepin  10 MG capsule Commonly known as: SINEQUAN        TAKE these medications    acetaminophen  325 MG tablet Commonly known as: TYLENOL  Take 2 tablets (650 mg total) by mouth every 6 (six) hours as needed for mild pain (pain score 1-3) (or Fever >/= 101).   amLODipine  5 MG tablet Commonly known as: NORVASC  Take 1 tablet (5 mg total) by mouth daily. Start taking on: Oct 09, 2023   atorvastatin  20 MG tablet Commonly known as: LIPITOR Take 20 mg by mouth daily.   busPIRone  7.5 MG tablet Commonly known as: BUSPAR  Take 7.5 mg by mouth 2 (two) times daily.   cetirizine  10 MG tablet Commonly known as: ZYRTEC  Take 1 tablet (10 mg total) by mouth 2 (two) times daily.   clopidogrel  75 MG tablet Commonly known as: PLAVIX  Take 75 mg by mouth daily.   Dupixent  300 MG/2ML prefilled syringe Generic drug: dupilumab  Inject 300 mg into the skin every 14 (fourteen) days.   folic acid 1 MG tablet Commonly known as: FOLVITE Take 1 tablet (1 mg total) by mouth daily. Start taking on: Oct 09, 2023   gabapentin  100 MG capsule Commonly known as: Neurontin  Take 1 capsule (100 mg total) by mouth at bedtime.   memantine  10 MG tablet Commonly known as: NAMENDA  Take 1 tablet (10 mg total) by mouth 2 (two) times daily.   mirtazapine  7.5 MG tablet Commonly known as: REMERON  Take 1 tablet (7.5 mg total) by mouth at bedtime.   pantoprazole  40 MG tablet Commonly known as: PROTONIX  Take 1 tablet (40 mg total) by mouth 2 (two) times daily.   polyethylene glycol powder 17 GM/SCOOP powder Commonly known as: GLYCOLAX/MIRALAX Take 17 g by mouth daily as needed for mild constipation.   sertraline  50 MG tablet Commonly known as: Zoloft  Take 1.5 tablets (75 mg total) by mouth daily.        Contact information for follow-up providers     Theoplis Fix, MD. Schedule an appointment as soon as possible for a visit in 10 day(s).   Specialty: Internal Medicine Why: At discharge from the skilled nursing facility. Contact information: [address] [place] [address] [phone]              Contact information for after-discharge care     Destination     [facility] .   Service: Skilled Nursing Contact information: [address] [facility] [place]  [address] [phone]                    Discharge Exam:  Filed Weights   09/30/23 1850 09/30/23 2217 10/04/23 1339  Weight: 45.1 kg 54.3 kg 54.8 kg   General exam: Alert, awake, oriented x 3 Respiratory system: Clear to auscultation. Respiratory effort normal. Cardiovascular system:RRR. No murmurs, rubs, gallops. Gastrointestinal system: Abdomen is nondistended, soft and nontender. No organomegaly or masses felt. Normal bowel sounds heard. Central nervous system: Alert and oriented. No focal neurological deficits. Extremities: No C/C/E, +pedal pulses Skin: No rashes, lesions or ulcers Psychiatry: Judgement and insight appear normal. Mood & affect appropriate.    Condition at  discharge: Stable and improved.  The results of significant diagnostics from this hospitalization (including imaging, microbiology, ancillary and laboratory) are listed below for reference.   Imaging Studies: No results found.  Microbiology: Results for orders placed or performed during the hospital encounter of 09/30/23  MRSA Next Gen by PCR, Nasal     Status: Abnormal   Collection Time: 10/04/23  1:36 PM   Specimen: Nasal Mucosa; Nasal Swab  Result Value Ref Range Status   MRSA by PCR Next Gen DETECTED (A) NOT DETECTED Final    Comment: RESULT CALLED TO, READ BACK BY AND VERIFIED WITH: G ROOS AT 1535 ON [id number] BY S DALTON (NOTE) The GeneXpert MRSA Assay (FDA approved for NASAL specimens only), is one component of a comprehensive MRSA colonization surveillance program. It is not intended to diagnose MRSA infection nor to guide or monitor treatment for MRSA infections. Test performance is not FDA approved in patients less than 5 years old. Performed at [facility], [address], [place], [place] [address]     Labs: CBC: Recent Labs  Lab 10/02/23 [phone] 10/03/23 [phone] 10/04/23 0442 10/04/23 1543 10/05/23 0359 10/06/23 0451  WBC 6.1 5.6 5.0  --  6.3 5.8  NEUTROABS 3.9  --   --   --   --   --   HGB 11.1* 9.9* 10.1* 11.5* 10.0* 9.9*  HCT 34.9* 29.6* 31.8* 35.4* 30.8* 32.8*  MCV 98.6 97.4 97.0  --  96.9 99.1  PLT 225 209 215  --  233 206   Basic Metabolic Panel: Recent Labs  Lab 10/04/23 0442 10/05/23 0359 10/06/23 0451 10/07/23 0501 10/08/23 0432  NA 142 137 138 141 141  K 4.0 3.8 3.9 3.9 3.8  CL 111 108 109 115* 116*  CO2 23 20* 22 19* 18*  GLUCOSE 96 95 105* 83 108*  BUN 16 18 20  27* 30*  CREATININE 1.35* 1.64* 2.00* 2.52* 1.85*  CALCIUM  8.7* 8.2* 8.2* 8.0* 7.6*  PHOS  --   --   --  4.0  --    Liver Function Tests: Recent Labs  Lab 10/07/23 0501  ALBUMIN 2.5*   CBG: Recent Labs  Lab 10/07/23 0515 10/07/23 1142 10/07/23 1626 10/07/23 2322  10/08/23 0536  GLUCAP 89 120* 139* 125* 118*    Discharge time spent: greater than 30 minutes.  Signed: Justina Oman, MD [facility] 10/08/2023

## 2023-10-08 NOTE — Care Management Important Message (Signed)
 Important Message  Patient Details  Name: Susan Bennett MRN: [id number] Date of Birth: 1940-06-23   Important Message Given:  Yes - Medicare IM     Aireona Torelli L Rudean Icenhour 10/08/2023, 10:45 AM

## 2023-11-04 ENCOUNTER — Inpatient Hospital Stay: Admitting: Gastroenterology

## 2023-12-25 ENCOUNTER — Ambulatory Visit (INDEPENDENT_AMBULATORY_CARE_PROVIDER_SITE_OTHER): Admitting: Physician Assistant

## 2023-12-25 DIAGNOSIS — L89890 Pressure ulcer of other site, unstageable: Secondary | ICD-10-CM | POA: Diagnosis not present

## 2023-12-25 DIAGNOSIS — L03031 Cellulitis of right toe: Secondary | ICD-10-CM

## 2023-12-26 ENCOUNTER — Encounter: Payer: Self-pay | Admitting: Physician Assistant

## 2023-12-26 NOTE — Progress Notes (Addendum)
 Office Visit Note   Patient: Susan Bennett           Date of Birth: [birth year]           MRN: [id number] Visit Date: 12/25/2023              Requested by: Maree Isles, MD [address] [place],  [place] [address] PCP: Maree Isles, MD  Chief Complaint  Patient presents with   Right Foot - Pain    Referral from Beverley [facility]man right GT infection      HPI: 83 y/o female with right GT wound and erythema that began on 12/22/23.  She was developing rubbing/bunion deformities B GT.  She was placed in silicon toe spacers.  The spacer irritated the skin between the right GT and second toe causing an op pressure ulcer.  She denies history of foot wounds, fevers or chills.  She has started Doxycycline for 10 days BID.    Assessment & Plan: Visit Diagnoses: No diagnosis found.  Plan: Antibiotic ointment with band aide daily.  Wash with soap and water daily.  Complete oral antibiotics.  Discontinue use of silicon spacers.    Follow-Up Instructions: Return in about 2 weeks (around 01/08/2024).   Ortho Exam  Patient is alert, oriented, no adenopathy, well-dressed, normal affect, normal respiratory effort. She has a palpable DP pulse with good biphasic doppler signals in the PT/Peroneal artery's.  Mild erythema right GT with superficial open pressure ulcer lateral base.  No active drainage.      Imaging: No results found.    Labs: Lab Results  Component Value Date   ESRSEDRATE 4 05/15/2022   CRP 14 (H) 05/15/2022     Lab Results  Component Value Date   ALBUMIN 2.5 (L) 10/07/2023   ALBUMIN 2.8 (L) 09/30/2023   ALBUMIN 2.0 (L) 12/23/2022    Lab Results  Component Value Date   MG 2.1 09/24/2022   No results found for: VD25OH  No results found for: PREALBUMIN    Latest Ref Rng & Units 10/06/2023    4:51 AM 10/05/2023    3:59 AM 10/04/2023    3:43 PM  CBC EXTENDED  WBC 4.0 - 10.5 K/uL 5.8  6.3    RBC 3.87 - 5.11 MIL/uL 3.31  3.18    Hemoglobin 12.0 - 15.0 g/dL 9.9  89.9  88.4    HCT 36.0 - 46.0 % 32.8  30.8  35.4   Platelets 150 - 400 K/uL 206  233       There is no height or weight on file to calculate BMI.  Orders:  No orders of the defined types were placed in this encounter.  No orders of the defined types were placed in this encounter.    Procedures: No procedures performed  Clinical Data: No additional findings.  ROS:  All other systems negative, except as noted in the HPI. Review of Systems  Objective: Vital Signs: There were no vitals taken for this visit.  Specialty Comments:  MRI LUMBAR SPINE WITHOUT CONTRAST     TECHNIQUE:  Multiplanar, multisequence MR imaging of the lumbar spine was  performed. No intravenous contrast was administered.     COMPARISON:  None.     FINDINGS:  Segmentation: There are 5 non-rib bearing lumbar type vertebral  bodies with the last intervertebral disc space labeled as L5-S1.     Alignment: There is a minimal retrolisthesis of L5 on S1. there is  also a minimal retrolisthesis of L2  on L3.     Vertebrae: The vertebral body heights are well maintained. There is  endplate reactive changes seen at L3-L4 and L5-S1.     Conus medullaris and cauda equina: Conus extends to the L1 level.  Conus and cauda equina appear normal.     Paraspinal and other soft tissues: There is a 1.7 cm cystic lesion  adjacent to the iliac vasculature best seen on series 6, image 46.  The remainder of the paraspinal soft tissues and visualized  retroperitoneal structures are unremarkable. The sacroiliac joints  are intact.     Disc levels:     T12-L1:  No significant canal or neural foraminal narrowing.     L1-L2: There is a broad-based disc bulge with a tiny focal central  disc protrusion. There is facet arthrosis and ligamentum flavum  hypertrophy. Mild effacement anterior thecal sac is seen.     L2-L3: There is a broad-based disc bulge with facet arthrosis and  ligamentum flavum hypertrophy. Moderate right and mild  left neural  foraminal narrowing is seen. There is mild effacement of the  anterior thecal sac.     L3-L4: There is a broad-based disc bulge with a right lateral  recess/foraminal disc protrusion which contacts the descending right  L4 nerve root. There is severe right and mild left neural foraminal  narrowing. The central thecal sac is effaced measuring 7 mm in AP  diameter.     L4-L5: There is a broad-based disc bulge with a right foraminal disc  protrusion which contacts the descending right L5 nerve root. There  is severe bilateral neural foraminal narrowing. The central thecal  sac measures 6 mm in AP diameter.     L5-S1: There is a broad-based diffuse bulge which is eccentric to  the left with facet arthrosis and ligamentum flavum hypertrophy.  Severe left and moderate right neural foraminal narrowing are seen.     IMPRESSION:  1. Minimal retrolisthesis of L2 on L3 and L5 on S1.  2. Lumbar spine spondylosis most notable at L3-L4 with a right  lateral recess/foraminal disc protrusion contacting the descending  right L4 nerve root with severe right neural foraminal narrowing and  mild to moderate central canal stenosis. Also at L4-L5 there is a  right foraminal disc protrusion contacting the descending right L5  nerve root with severe bilateral neural foraminal narrowing and  moderate central canal stenosis.        Electronically Signed    By: Merlyn Rajas M.D.    On: 01/21/2019 14:26  PMFS History: Patient Active Problem List   Diagnosis Date Noted   Hyperlipidemia 10/08/2023   Rectal bleeding 10/02/2023   Esophageal dysphagia 10/02/2023   Hematochezia 09/30/2023   CKD (chronic kidney disease), stage III (HCC) 09/04/2023   TIA (transient ischemic attack) 09/04/2023   Idiopathic urticaria 06/19/2022   Bilateral lower extremity edema 06/19/2022   Moderate protein malnutrition (HCC) 06/10/2022   AKI (acute kidney injury) (HCC) 06/04/2022   Aspiration into respiratory  tract 06/04/2022   Atelectasis 06/04/2022   Dehydration, severe 06/04/2022   Protrusion of lumbar intervertebral disc 11/15/2021   Early onset Alzheimer dementia (HCC) 12/02/2016   Vitamin B12 deficiency 12/02/2016   Primary hypertension 04/19/2016   Mild episode of recurrent major depressive disorder (HCC) 04/19/2016   Past Medical History:  Diagnosis Date   Acute kidney failure (HCC) 06/04/2022   Aspiration into respiratory tract 06/04/2022   Bilateral lower extremity edema 06/19/2022   Chronic diarrhea  CKD (chronic kidney disease), stage III (HCC) 09/04/2023   -Patient seems to be at baseline  -Will monitor     Contact dermatitis and other eczema, due to unspecified cause    Dehydration, severe 06/04/2022   Depressive disorder, not elsewhere classified    Early onset Alzheimer dementia (HCC) 12/02/2016   -Continue memantine , mirtazapine      Idiopathic urticaria 06/19/2022   Mild episode of recurrent major depressive disorder (HCC) 04/19/2016   Moderate protein malnutrition (HCC) 06/10/2022   Osteoporosis, unspecified    Other and unspecified hyperlipidemia    Other B-complex deficiencies    Primary hypertension 04/19/2016   -Continue benazepril      Protrusion of lumbar intervertebral disc 11/15/2021   TIA (transient ischemic attack) 09/04/2023   -CTA head and neck negative for acute ischemic event  -Evidence of atherosclerotic lesions, however known occlusive  -For MRI brain in the morning  -Start aspirin 81 mg daily and Plavix  75 mg daily  -Atorvastatin  20 mg daily     Unspecified essential hypertension    Unspecified gastritis and gastroduodenitis without mention of hemorrhage    Unspecified vitamin D  deficiency    Vitamin B12 deficiency 12/02/2016    Family History  Problem Relation Age of Onset   Healthy Sister    Healthy Brother    Throat cancer Brother    Stroke Brother    Heart disease Brother    Throat cancer Maternal Aunt     Past Surgical History:   Procedure Laterality Date   COLONOSCOPY     9 YEARS AGO   COLONOSCOPY N/A 10/04/2023   Procedure: COLONOSCOPY;  Surgeon: Eartha Angelia Sieving, MD;  Location: [facility];  Service: Gastroenterology;  Laterality: N/A;   ESOPHAGEAL DILATION N/A 10/04/2023   Procedure: DILATION, ESOPHAGUS;  Surgeon: Eartha Angelia Sieving, MD;  Location: [facility];  Service: Gastroenterology;  Laterality: N/A;   ESOPHAGOGASTRODUODENOSCOPY N/A 10/04/2023   Procedure: EGD (ESOPHAGOGASTRODUODENOSCOPY);  Surgeon: Eartha Angelia, Sieving, MD;  Location: [facility];  Service: Gastroenterology;  Laterality: N/A;   Social History   Occupational History   Occupation: retired   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  Tobacco Use   Smoking status: Every Day    Current packs/day: 0.00    Types: Cigarettes    Last attempt to quit: 02/20/2001    Years since quitting: 22.8    Passive exposure: Current   Smokeless tobacco: Never  Vaping Use   Vaping status: Never Used  Substance and Sexual Activity   Alcohol use: No   Drug use: No   Sexual activity: Not Currently

## 2024-01-08 ENCOUNTER — Ambulatory Visit (INDEPENDENT_AMBULATORY_CARE_PROVIDER_SITE_OTHER): Admitting: Orthopedic Surgery

## 2024-01-08 ENCOUNTER — Encounter: Payer: Self-pay | Admitting: Orthopedic Surgery

## 2024-01-08 DIAGNOSIS — L03031 Cellulitis of right toe: Secondary | ICD-10-CM | POA: Diagnosis not present

## 2024-01-08 NOTE — Progress Notes (Signed)
 Office Visit Note   Patient: Susan Bennett           Date of Birth: [birth year]           MRN: [id number] Visit Date: 01/08/2024              Requested by: Maree Isles, MD [address] [place],  [place] [address] PCP: Maree Isles, MD  Chief Complaint  Patient presents with   Right Foot - Follow-up      HPI: Patient is an 83 year old woman who was seen in follow-up for ulceration first webspace right great toe.  The patient's and family state the ulcer looks better.  Patient is wearing a new pair of sketcher shoes.  Assessment & Plan: Visit Diagnoses:  1. Cellulitis of great toe of right foot     Plan: Discussed that her shoe wear is too narrow for her foot.  Recommended a postoperative Velcro shoe or open toed sandal.  Patient and family state they will try to get a wider shoe.  Follow-Up Instructions: Return if symptoms worsen or fail to improve.   Ortho Exam  Patient is alert, oriented, no adenopathy, well-dressed, normal affect, normal respiratory effort. Examination patient has a crease on the dorsum of the great toe secondary to pressure from the tight shoe wear.  The ulcer in the first webspace on the great toe at the IP joint is essentially healed.  There is no redness no cellulitis no sausage digit swelling.  No drainage.    Imaging: No results found. No images are attached to the encounter.  Labs: Lab Results  Component Value Date   ESRSEDRATE 4 05/15/2022   CRP 14 (H) 05/15/2022     Lab Results  Component Value Date   ALBUMIN 2.5 (L) 10/07/2023   ALBUMIN 2.8 (L) 09/30/2023   ALBUMIN 2.0 (L) 12/23/2022    Lab Results  Component Value Date   MG 2.1 09/24/2022   No results found for: VD25OH  No results found for: PREALBUMIN    Latest Ref Rng & Units 10/06/2023    4:51 AM 10/05/2023    3:59 AM 10/04/2023    3:43 PM  CBC EXTENDED  WBC 4.0 - 10.5 K/uL 5.8  6.3    RBC 3.87 - 5.11 MIL/uL 3.31  3.18    Hemoglobin 12.0 - 15.0 g/dL 9.9  89.9  88.4   HCT  36.0 - 46.0 % 32.8  30.8  35.4   Platelets 150 - 400 K/uL 206  233       There is no height or weight on file to calculate BMI.  Orders:  No orders of the defined types were placed in this encounter.  No orders of the defined types were placed in this encounter.    Procedures: No procedures performed  Clinical Data: No additional findings.  ROS:  All other systems negative, except as noted in the HPI. Review of Systems  Objective: Vital Signs: There were no vitals taken for this visit.  Specialty Comments:  MRI LUMBAR SPINE WITHOUT CONTRAST     TECHNIQUE:  Multiplanar, multisequence MR imaging of the lumbar spine was  performed. No intravenous contrast was administered.     COMPARISON:  None.     FINDINGS:  Segmentation: There are 5 non-rib bearing lumbar type vertebral  bodies with the last intervertebral disc space labeled as L5-S1.     Alignment: There is a minimal retrolisthesis of L5 on S1. there is  also a minimal retrolisthesis  of L2 on L3.     Vertebrae: The vertebral body heights are well maintained. There is  endplate reactive changes seen at L3-L4 and L5-S1.     Conus medullaris and cauda equina: Conus extends to the L1 level.  Conus and cauda equina appear normal.     Paraspinal and other soft tissues: There is a 1.7 cm cystic lesion  adjacent to the iliac vasculature best seen on series 6, image 46.  The remainder of the paraspinal soft tissues and visualized  retroperitoneal structures are unremarkable. The sacroiliac joints  are intact.     Disc levels:     T12-L1:  No significant canal or neural foraminal narrowing.     L1-L2: There is a broad-based disc bulge with a tiny focal central  disc protrusion. There is facet arthrosis and ligamentum flavum  hypertrophy. Mild effacement anterior thecal sac is seen.     L2-L3: There is a broad-based disc bulge with facet arthrosis and  ligamentum flavum hypertrophy. Moderate right and mild left  neural  foraminal narrowing is seen. There is mild effacement of the  anterior thecal sac.     L3-L4: There is a broad-based disc bulge with a right lateral  recess/foraminal disc protrusion which contacts the descending right  L4 nerve root. There is severe right and mild left neural foraminal  narrowing. The central thecal sac is effaced measuring 7 mm in AP  diameter.     L4-L5: There is a broad-based disc bulge with a right foraminal disc  protrusion which contacts the descending right L5 nerve root. There  is severe bilateral neural foraminal narrowing. The central thecal  sac measures 6 mm in AP diameter.     L5-S1: There is a broad-based diffuse bulge which is eccentric to  the left with facet arthrosis and ligamentum flavum hypertrophy.  Severe left and moderate right neural foraminal narrowing are seen.     IMPRESSION:  1. Minimal retrolisthesis of L2 on L3 and L5 on S1.  2. Lumbar spine spondylosis most notable at L3-L4 with a right  lateral recess/foraminal disc protrusion contacting the descending  right L4 nerve root with severe right neural foraminal narrowing and  mild to moderate central canal stenosis. Also at L4-L5 there is a  right foraminal disc protrusion contacting the descending right L5  nerve root with severe bilateral neural foraminal narrowing and  moderate central canal stenosis.        Electronically Signed    By: Merlyn Rajas M.D.    On: 01/21/2019 14:26  PMFS History: Patient Active Problem List   Diagnosis Date Noted   Hyperlipidemia 10/08/2023   Rectal bleeding 10/02/2023   Esophageal dysphagia 10/02/2023   Hematochezia 09/30/2023   CKD (chronic kidney disease), stage III (HCC) 09/04/2023   TIA (transient ischemic attack) 09/04/2023   Idiopathic urticaria 06/19/2022   Bilateral lower extremity edema 06/19/2022   Moderate protein malnutrition (HCC) 06/10/2022   AKI (acute kidney injury) (HCC) 06/04/2022   Aspiration into respiratory tract  06/04/2022   Atelectasis 06/04/2022   Dehydration, severe 06/04/2022   Protrusion of lumbar intervertebral disc 11/15/2021   Early onset Alzheimer dementia (HCC) 12/02/2016   Vitamin B12 deficiency 12/02/2016   Primary hypertension 04/19/2016   Mild episode of recurrent major depressive disorder (HCC) 04/19/2016   Past Medical History:  Diagnosis Date   Acute kidney failure (HCC) 06/04/2022   Aspiration into respiratory tract 06/04/2022   Bilateral lower extremity edema 06/19/2022   Chronic diarrhea  CKD (chronic kidney disease), stage III (HCC) 09/04/2023   -Patient seems to be at baseline  -Will monitor     Contact dermatitis and other eczema, due to unspecified cause    Dehydration, severe 06/04/2022   Depressive disorder, not elsewhere classified    Early onset Alzheimer dementia (HCC) 12/02/2016   -Continue memantine , mirtazapine      Idiopathic urticaria 06/19/2022   Mild episode of recurrent major depressive disorder (HCC) 04/19/2016   Moderate protein malnutrition (HCC) 06/10/2022   Osteoporosis, unspecified    Other and unspecified hyperlipidemia    Other B-complex deficiencies    Primary hypertension 04/19/2016   -Continue benazepril      Protrusion of lumbar intervertebral disc 11/15/2021   TIA (transient ischemic attack) 09/04/2023   -CTA head and neck negative for acute ischemic event  -Evidence of atherosclerotic lesions, however known occlusive  -For MRI brain in the morning  -Start aspirin 81 mg daily and Plavix  75 mg daily  -Atorvastatin  20 mg daily     Unspecified essential hypertension    Unspecified gastritis and gastroduodenitis without mention of hemorrhage    Unspecified vitamin D  deficiency    Vitamin B12 deficiency 12/02/2016    Family History  Problem Relation Age of Onset   Healthy Sister    Healthy Brother    Throat cancer Brother    Stroke Brother    Heart disease Brother    Throat cancer Maternal Aunt     Past Surgical History:  Procedure  Laterality Date   COLONOSCOPY     9 YEARS AGO   COLONOSCOPY N/A 10/04/2023   Procedure: COLONOSCOPY;  Surgeon: Eartha Angelia Sieving, MD;  Location: [facility];  Service: Gastroenterology;  Laterality: N/A;   ESOPHAGEAL DILATION N/A 10/04/2023   Procedure: DILATION, ESOPHAGUS;  Surgeon: Eartha Angelia Sieving, MD;  Location: [facility];  Service: Gastroenterology;  Laterality: N/A;   ESOPHAGOGASTRODUODENOSCOPY N/A 10/04/2023   Procedure: EGD (ESOPHAGOGASTRODUODENOSCOPY);  Surgeon: Eartha Angelia, Sieving, MD;  Location: [facility];  Service: Gastroenterology;  Laterality: N/A;   Social History   Occupational History   Occupation: retired   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  Tobacco Use   Smoking status: Every Day    Current packs/day: 0.00    Types: Cigarettes    Last attempt to quit: 02/20/2001    Years since quitting: 22.8    Passive exposure: Current   Smokeless tobacco: Never  Vaping Use   Vaping status: Never Used  Substance and Sexual Activity   Alcohol use: No   Drug use: No   Sexual activity: Not Currently

## 2024-01-12 ENCOUNTER — Other Ambulatory Visit: Payer: Self-pay | Admitting: *Deleted

## 2024-01-12 MED ORDER — DUPIXENT 300 MG/2ML ~~LOC~~ SOSY: 300.0000 mg | PREFILLED_SYRINGE | SUBCUTANEOUS | 11 refills | Status: AC

## 2024-01-12 NOTE — Telephone Encounter (Signed)
 Daughter had called to request refill reached out and advised refill sent

## 2024-03-17 ENCOUNTER — Encounter (INDEPENDENT_AMBULATORY_CARE_PROVIDER_SITE_OTHER): Payer: Self-pay | Admitting: Gastroenterology

## 2024-03-31 ENCOUNTER — Telehealth: Payer: Self-pay | Admitting: Allergy & Immunology

## 2024-03-31 NOTE — Telephone Encounter (Signed)
 I spoke with Barnie the daughter. Nursing home believes the diarrhea is effect from Dupixent . I spoke with Arlean to see if this was a side effect and she stated that it is not a common side effect with the use of Dupixent . I explained this to daughter. Arlean stated if they want to hold the dupixent  for a couple of weeks to see if this was the reason, more than welcome to stop. Will let us  know what happens.

## 2024-03-31 NOTE — Telephone Encounter (Signed)
 Patient is in a nursing home and is still currently using Dupixent . Patient's daughter states that she is having diarrhea every time she eats and the doctors at the nursing home is saying that she is having it due to the Dupixent  injection and the daughter is wanting to know what we can do for her at this point.

## 2024-04-05 ENCOUNTER — Encounter: Payer: Self-pay | Admitting: Radiology

## 2024-05-12 ENCOUNTER — Telehealth: Payer: Self-pay | Admitting: Allergy & Immunology

## 2024-05-12 NOTE — Telephone Encounter (Signed)
 Called to schedule Dupixent  reapproval appointment. Voicemail was full, could not leave message.
